# Patient Record
Sex: Male | Born: 1935 | Race: White | Hispanic: No | Smoking: Never smoker
Health system: Southern US, Community
[De-identification: ages and names within clinical notes are randomized; demographics above are authoritative.]

## PROBLEM LIST (undated history)

## (undated) DIAGNOSIS — M549 Dorsalgia, unspecified: Secondary | ICD-10-CM

## (undated) DIAGNOSIS — I10 Essential (primary) hypertension: Secondary | ICD-10-CM

## (undated) DIAGNOSIS — I499 Cardiac arrhythmia, unspecified: Secondary | ICD-10-CM

## (undated) DIAGNOSIS — I639 Cerebral infarction, unspecified: Secondary | ICD-10-CM

## (undated) DIAGNOSIS — M1990 Unspecified osteoarthritis, unspecified site: Secondary | ICD-10-CM

## (undated) DIAGNOSIS — Z8719 Personal history of other diseases of the digestive system: Secondary | ICD-10-CM

## (undated) DIAGNOSIS — I4891 Unspecified atrial fibrillation: Secondary | ICD-10-CM

## (undated) DIAGNOSIS — I482 Chronic atrial fibrillation, unspecified: Secondary | ICD-10-CM

## (undated) DIAGNOSIS — J189 Pneumonia, unspecified organism: Secondary | ICD-10-CM

## (undated) DIAGNOSIS — I63411 Cerebral infarction due to embolism of right middle cerebral artery: Secondary | ICD-10-CM

## (undated) DIAGNOSIS — K469 Unspecified abdominal hernia without obstruction or gangrene: Secondary | ICD-10-CM

## (undated) DIAGNOSIS — S0990XA Unspecified injury of head, initial encounter: Secondary | ICD-10-CM

## (undated) DIAGNOSIS — J45909 Unspecified asthma, uncomplicated: Secondary | ICD-10-CM

## (undated) DIAGNOSIS — S42309A Unspecified fracture of shaft of humerus, unspecified arm, initial encounter for closed fracture: Secondary | ICD-10-CM

## (undated) DIAGNOSIS — N179 Acute kidney failure, unspecified: Secondary | ICD-10-CM

## (undated) HISTORY — PX: FINGER AMPUTATION: SHX636

## (undated) HISTORY — DX: Chronic atrial fibrillation, unspecified: I48.20

---

## 2000-05-11 ENCOUNTER — Emergency Department (HOSPITAL_COMMUNITY): Admission: EM | Admit: 2000-05-11 | Discharge: 2000-05-12 | Payer: Self-pay | Admitting: Emergency Medicine

## 2000-05-11 ENCOUNTER — Encounter: Payer: Self-pay | Admitting: Emergency Medicine

## 2000-05-16 ENCOUNTER — Encounter: Admission: RE | Admit: 2000-05-16 | Discharge: 2000-05-16 | Payer: Self-pay | Admitting: Family Medicine

## 2005-07-08 ENCOUNTER — Observation Stay (HOSPITAL_COMMUNITY): Admission: EM | Admit: 2005-07-08 | Discharge: 2005-07-09 | Payer: Self-pay | Admitting: Emergency Medicine

## 2005-07-08 ENCOUNTER — Ambulatory Visit: Payer: Self-pay | Admitting: Sports Medicine

## 2005-08-03 ENCOUNTER — Encounter (INDEPENDENT_AMBULATORY_CARE_PROVIDER_SITE_OTHER): Payer: Self-pay | Admitting: Specialist

## 2005-08-03 ENCOUNTER — Ambulatory Visit (HOSPITAL_COMMUNITY): Admission: RE | Admit: 2005-08-03 | Discharge: 2005-08-04 | Payer: Self-pay | Admitting: Urology

## 2006-06-04 ENCOUNTER — Emergency Department (HOSPITAL_COMMUNITY): Admission: EM | Admit: 2006-06-04 | Discharge: 2006-06-04 | Payer: Self-pay | Admitting: Emergency Medicine

## 2006-06-14 ENCOUNTER — Inpatient Hospital Stay (HOSPITAL_COMMUNITY): Admission: EM | Admit: 2006-06-14 | Discharge: 2006-06-17 | Payer: Self-pay | Admitting: Emergency Medicine

## 2006-06-15 ENCOUNTER — Ambulatory Visit: Payer: Self-pay | Admitting: Cardiology

## 2006-06-15 ENCOUNTER — Encounter: Payer: Self-pay | Admitting: Cardiology

## 2006-08-07 ENCOUNTER — Encounter: Admission: RE | Admit: 2006-08-07 | Discharge: 2006-08-07 | Payer: Self-pay | Admitting: Neurology

## 2007-06-14 ENCOUNTER — Ambulatory Visit: Payer: Self-pay | Admitting: Infectious Diseases

## 2007-06-14 ENCOUNTER — Inpatient Hospital Stay (HOSPITAL_COMMUNITY): Admission: EM | Admit: 2007-06-14 | Discharge: 2007-06-25 | Payer: Self-pay | Admitting: Emergency Medicine

## 2007-06-15 ENCOUNTER — Encounter: Payer: Self-pay | Admitting: Infectious Diseases

## 2007-06-17 ENCOUNTER — Encounter: Payer: Self-pay | Admitting: Infectious Diseases

## 2008-11-29 IMAGING — CR DG CHEST 1V PORT
1 series · 1 of 1 positions shown · non-contrast
Comparison: 06/14/07.

CLINICAL DATA: Renal failure and pneumonia. 
 PORTABLE CHEST ? 1 VIEW ? 06/15/07 ? 7507 HOURS:

[view not recorded]
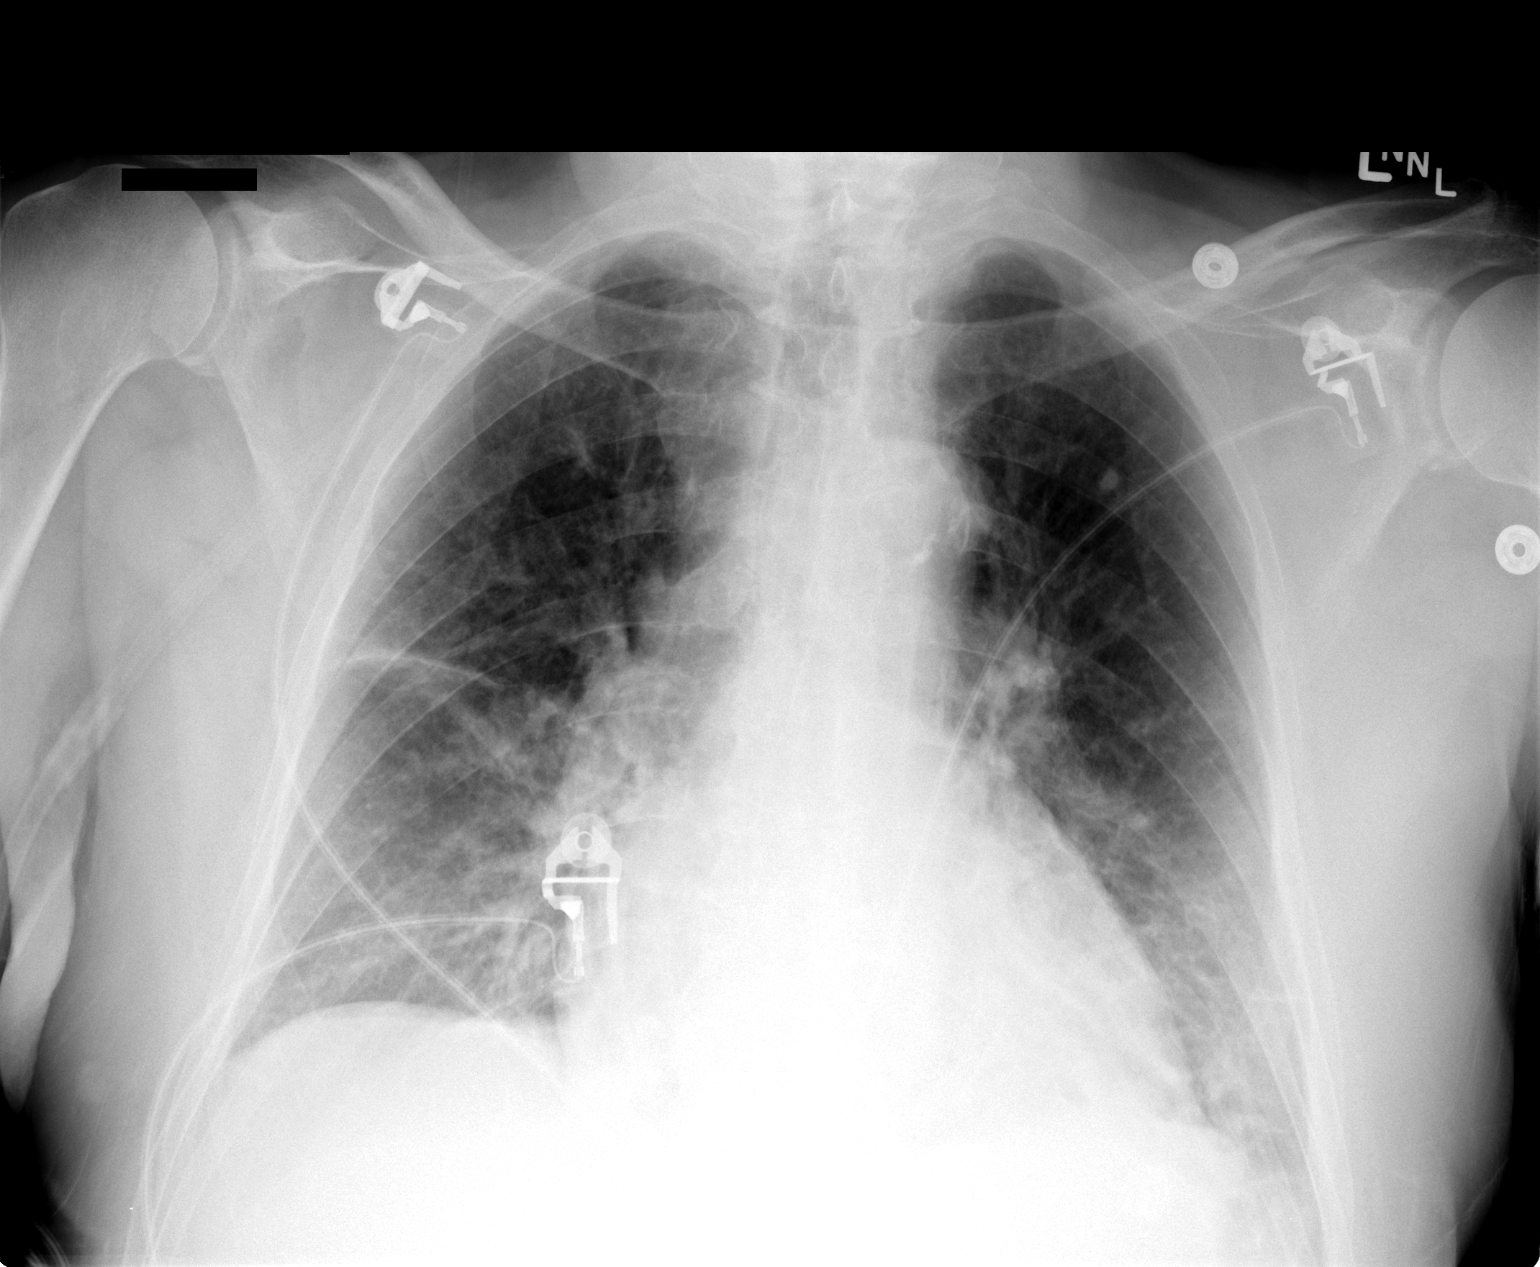

[1 of 1 positions shown; findings below may reference images not displayed]

FINDINGS: In the interval since the prior study, the patient now shows evidence of developing infiltrates in both lower lobes.  No overt edema.  No visible pleural effusions.  Stable cardiomegaly.
IMPRESSION: Bilateral lower lobe pulmonary infiltrates.

## 2008-11-29 IMAGING — CR DG CHEST 1V
1 series · 1 of 1 positions shown · non-contrast
Comparison: 3503 hours.

CLINICAL DATA: Pneumonia, renal failure and shortness of breath. 
 PORTABLE CHEST - 1 VIEW ? 06/15/07 AT 9616 HOURS:

[view not recorded]
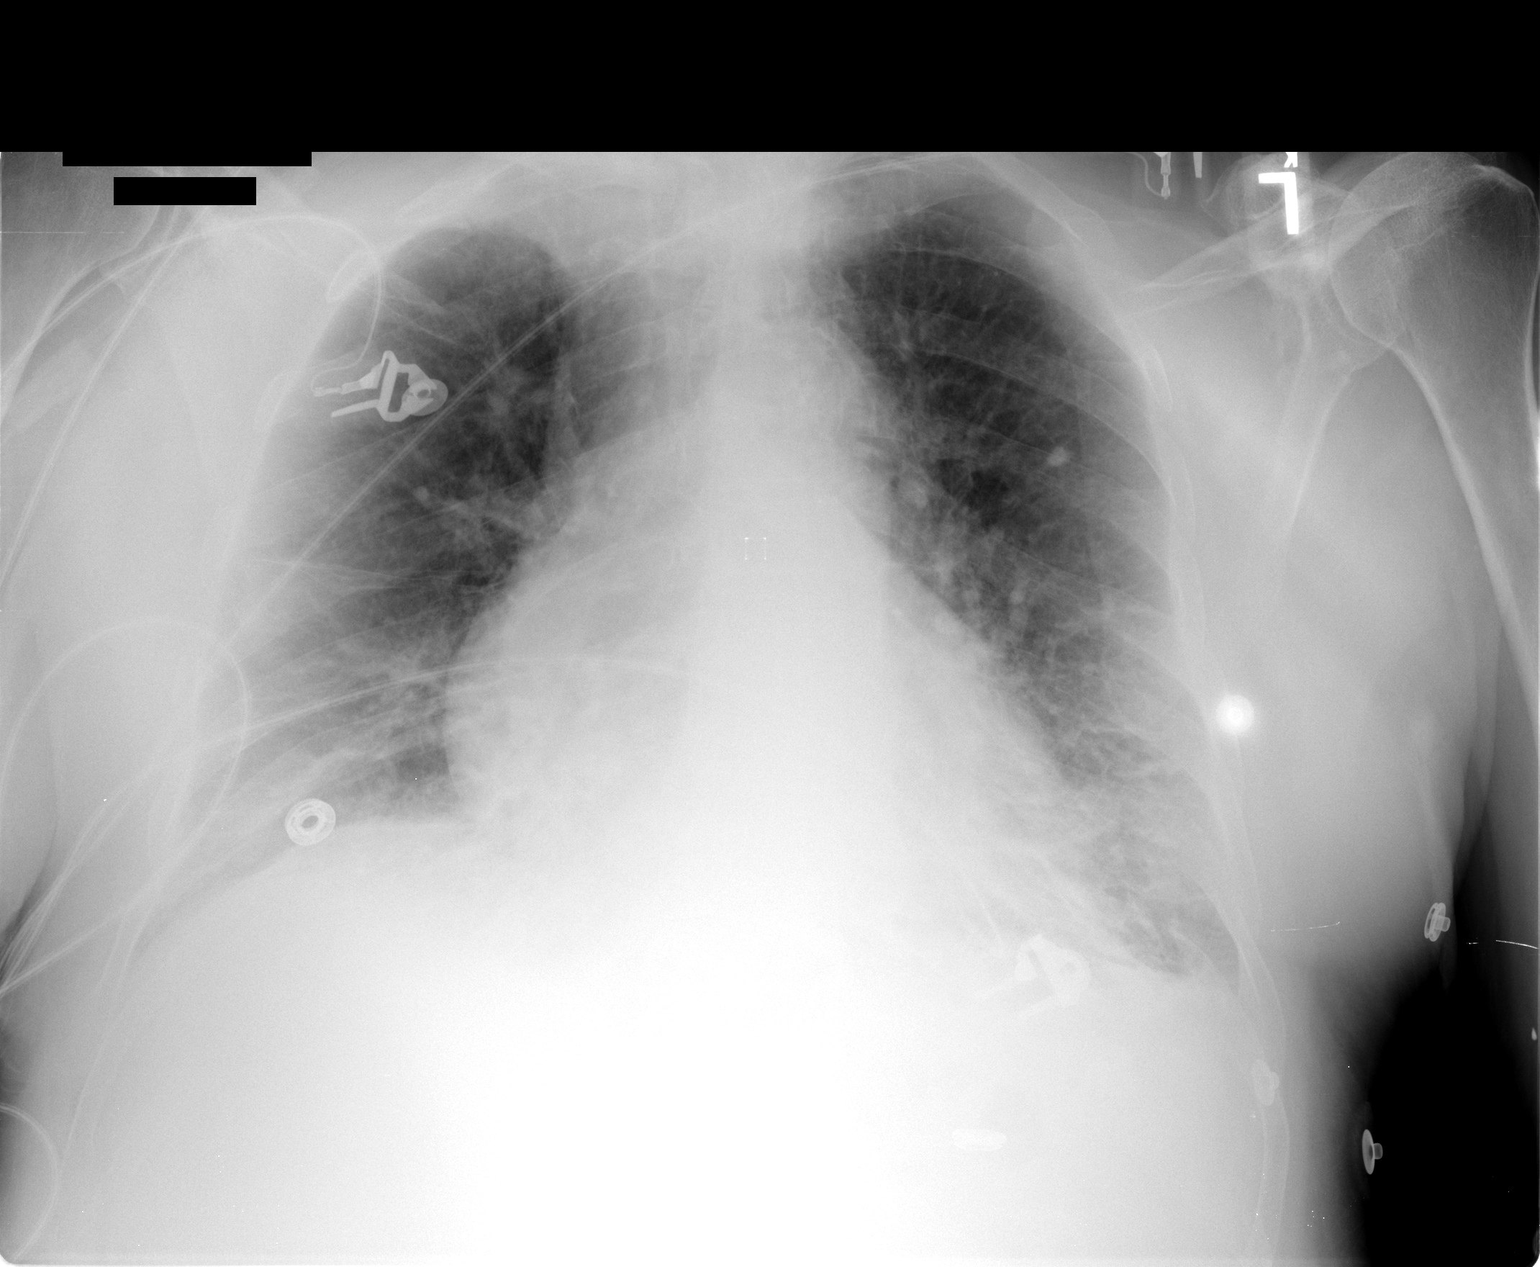

[1 of 1 positions shown; findings below may reference images not displayed]

FINDINGS: No significant change in bilateral lower lobe infiltrates.  No interval edema.  Stable heart size.
IMPRESSION: No significant change in appearance of bilateral lower lung infiltrates.

## 2011-02-10 NOTE — Procedures (Signed)
CLINICAL HISTORY:  77 year old man with a history of right sided tingling  and numbness and left subdural hematoma on the CT scan. EEG is performed for  evaluation of possible left brain focal seizure.  The patient is described  as awake and drowsy.  This is a routine EEG done without photic stimulation  and hyperventilation.   DESCRIPTION:  Predominant rhythm of this tracing is a low to moderate  amplitude alpha rhythm of 10 Hz which predominates posteriorly, appears  without abnormal asymmetry, and attenuates with opening and closing.  Low  amplitude fast activity is seen frontally and centrally and appears without  abnormal asymmetry.  No epileptiform discharges are seen. As the record  progresses, intermittent mild slowing into the 7-8 Hz range is seen over the  left hemisphere as compared to the right.  The patient remained in the awake  state throughout the recording.  Photic stimulation and hyperventilation  were not performed.  Single channel devoted to EKG revealed sinus rhythm  throughout with a rate of approximately 60 beats per minute.   CONCLUSIONS:  Abnormal study due to the presence of mild slowing of  background rhythms over the left hemisphere, findings suggestive of mild  underlying neural dysfunction and focal pathology.  No epileptiform  discharges seen, however.      Michael L. Thad Ranger, M.D.  Electronically Signed     WUX:[id number]  D:  06/14/2006 20:07:12  T:  06/17/2006 12:42:41  Job #:  [id number]

## 2011-02-10 NOTE — Discharge Summary (Signed)
Jerry [name], Jerry [name] NO.:  [id number]   MEDICAL RECORD NO.:  [id number]          PATIENT TYPE:  OBV   LOCATION:  4730                         FACILITY:  [facility]   PHYSICIAN:  Altamese Cabal, M.D.  DATE OF BIRTH:  11-Jan-1924   DATE OF ADMISSION:  07/08/2005  DATE OF DISCHARGE:  07/09/2005                                 DISCHARGE SUMMARY   DISCHARGE DIAGNOSES:  1.  Syncope.  2.  Hypertension.  3.  Hyperlipidemia.  4.  Benign prostatic hypertrophy.   DISCHARGE MEDICATIONS:  1.  Terazosin 10 mg p.o. at night.  2.  Lipitor 40 mg p.o. daily.  3.  Bactrim twice daily x3 days.   BRIEF HISTORY OF PRESENT ILLNESS:  This is a very pleasant 77 year old male  who presented after a syncopal episode witnessed by his son.  Apparently,  the patient was in a sitting position and began to doze off, however, his  son was not able to arouse him from this, and he remained unarousable for a  total of 15 minutes.  Recent changes were an increase in his medications,  notably terazosin.   HOSPITAL COURSE BY PROBLEM:  1.  Syncope:  We thought this was most likely due to synergistic effects of      his medications terazosin and Verapamil, so we held his Verapamil and      decreased his terazosin during his hospitalization.  We also monitored      him on telemetry overnight, and did not find any evidence of an      arrhythmia that may have precipitated this episode.  The patient was      actually asymptomatic when he was admitted.  So we wanted to rule-out a      cardiac and neurogenic source for syncope.  His head CT was within      normal limits.  All of his electrolytes were within normal limits, and      his chest x-ray did not show any acute abnormality.  He had normal      metabolic studies including normal TSH and glucose level.  We discharged      him home on just 10 mg of terazosin and held his Verapamil.  2.  Hypertension:  His blood pressures remained under excellent  control      during his hospitalization, even off of the Verapamil.  He may need      adjustment of his medications by his primary care doctor.  3.  Hyperlipidemia:  We continued his Lipitor while he was in the hospital.  4.  Urinary tract infection:  We continued the patient on Bactrim for his      urinary tract infection that was diagnosed by his primary care      physician.  We told the patient to take a total of seven days of this      antibiotic.   FOLLOWUP INSTRUCTIONS:  The patient is to take all medications as  prescribed.  He is to stop his Verapamil.  He is to follow-up with his  primary care  physician next week.      Altamese Cabal, M.D.     KS/MEDQ  D:  07/10/2005  T:  07/10/2005  Job:  [id number]   cc:   Windle Guard, M.D.  Fax: [phone]

## 2011-02-10 NOTE — H&P (Signed)
[place]. [facility]  Patient:    Jerry [name], Jerry [name]                         MRN: [id number] Adm. Date:  [id number] Attending:  Tobin Chad Dictator:   Solon Palm, M.D.                         History and Physical  ATTENDING PHYSICIAN:  Wayne A. Sheffield Slider, M.D.  RESIDENT PHYSICIAN:  Kinnie Scales. Reed Breech, M.D.  INTERN:  Solon Palm, M.D.  CHIEF COMPLAINT:  Syncope/allergic reaction.  HISTORY OF PRESENT ILLNESS:  Patient is a 77 year old white male who was out mowing the lawn at about 4-5 p.m. when stung by yellow jackets multiple times. Made it inside the bathroom, where he passed out.  Prior to mowing the lawn, he had drank about 40 beers and about two vodkas.  When EMS arrived, unresponsive, started CPR.  When started to intubate, patient aroused.  EMS also gave epinephrine x 1.  Patient apparently with pain at bee stings on left ear and left leg.  Denies shortness of breath, had chest discomfort earlier, none currently.  Complained of facial swelling.  Upon rising from lying position, became hypotensive and nauseated.  PAST MEDICAL HISTORY: 1. Hypertension. 2. Left little finger trauma, autoamputation. 3. Alcohol abuse. 4. History of tobacco abuse, quit 15 years ago.  MEDICATIONS:  Verapamil 250 mg one-and-a-half tablets q.d.  ALLERGIES: 1. PENICILLIN. 2. TETANUS.  FAMILY HISTORY:  Diabetes and hypertension.  SOCIAL HISTORY:  Lives with girlfriend.  Two wives have passed away.  Former smoker, drinks up to 16 drinks in a typical day.  Denies intravenous drug use.   REVIEW OF SYSTEMS:  No shortness of breath, some chest discomfort earlier. Dizziness when stands.  No dysuria, cough, no palpitations, no focal pain or neuro symptoms.  PHYSICAL EXAMINATION:  VITAL SIGNS:  Blood pressure 115/99, heart rate 74, respirations 24, temperature 96.5, saturation 93%.  HEENT:  PERRLA, EOMI, tympanic membranes clear.  Blood in right naris.   Facial edema.  Mucous membranes dry, poor dentition.  NECK:  Supple.  No lymph, no thyroid palpable.  CARDIOVASCULAR:  Regular rate and rhythm, S1 and S2, no S3, S4.  No murmurs, clicks, or gallops.   PMI is laterally displaced.  RESPIRATORY:  Lungs are clear to auscultation, although there is poor respiratory effort.  ABDOMEN:  Soft, nontender, nondistended, with positive bowel sounds.  Liver may be slightly enlarged.  NEUROLOGIC:  Cranial nerves 2-12 are intact.  Sensation is intact.  Also, patient is oriented x 4 and alert, slightly sluggish.  EXTREMITIES:  No clubbing, cyanosis, or edema.  Erythematous oval lesions, back of left leg, on left ear, back of right leg, on chest, and buttocks.  LABORATORY DATA:  Alcohol 75.  CK 141, MB 2.0, index 1.4, troponin I 0.03. ABG is 7.24/34/95/15.  ______ 1.4.  White count is 17.5, hemoglobin 15.5, hematocrit 47, platelets 231.  Sodium 137, potassium 3.7, chloride 106, CO2 14, BUN 9, creatinine 1.4, glucose 183.  ASSESSMENT AND PLAN: 1. Syncope, cardiac versus vasovagal versus asphyxia.  Telemetry, rule out MI.    Believe related to pain of stings and possibly hypotension from allergic    reactions. 2. Allergic reactions.  Fluid p.r.n. for hypotension and if severe, consider    more epinephrine.  Steroids and Benadryl.  Observation - possible delayed    reaction ______ hours  post sting.  Will continue IV Benadryl around the    clock overnight.  Will also need a fluid bolus if patient is    hypotensive. 3. Alcohol abuse.  Watch for signs of withdrawal.  Will check LFTs.  This is a    six-month history for this patient, and will request social work    involvement in rehab programs. 4. Leukocytosis with a left shift.  Will do urinalysis and blood cultures. 5. Metabolic acidosis.  Differential includes lactic acidosis, respiratory    down for some time.  Also possibly secondary to alcohol intake.  Will    follow CMP and a BMET in the  morning.  Will continue fluid hydration    overnight. 6. Elevated white count with left shift.  Will do urinalysis and blood    cultures to look for a source of possible infection. 7. Hypotension.  Will hold verapamil at this time. 8. Hypokalemia.  Patient has received two doses of IV potassium in the ER.    Will recheck and possibly replace p.o. potassium.  Likely secondary to    patients six month alcohol abuse history. DD:  05/11/00 TD:  05/12/00 Job: [id number]

## 2011-02-10 NOTE — Discharge Summary (Signed)
[place]. [facility]  Patient:    Jerry [name], Jerry [name]                         MRN: [id number] Adm. Date:  [id number] Disc. Date: [id number] Attending:  Tobin Chad Dictator:   Doren Custard, M.D. CC:         Dr. Jeannetta Nap   Discharge Summary  DISCHARGE DIAGNOSES: 1. Allergic reaction. 2. Syncope. 3. Alcohol intoxication.  DISCHARGE MEDICATIONS: 1. Verapamil continue as before. 2. Prednisone 40 mg p.o. q.d. x 4 days (to complete a five-day burst). 3. Benadryl 25 mg p.o. q.6h. x 2 days, then q.6h. p.r.n. 4. EpiPen inject IM p.r.n. bee, hornet, or wasp sting.  CONSULTATIONS:  None.  PROCEDURES:  None.  BRIEF HISTORY OF PRESENT ILLNESS:  This is a 77 year old man who was mowing his lawn when he went over a nest of yellow jackets and was stung multiple times. He made it inside to his bathroom when he passed out. He was found to be unresponsive. EMS was called and started CPR and attempted to intubate. The patient aroused and was subsequently brought to the emergency room.  HOSPITAL COURSE: #1 - ALLERGIC REACTION:  Felt to be probable anaphylaxis. The patient responded to epinephrine in the field, as well as Solu-Medrol, Benadryl, and Pepcid, and fluid and rapidly improved. By day of discharge, he is asymptomatic and without complaint. Of note, the patient was hypotensive for several hours after the stings, but he had no wheezing.  #2 - HYPERTENSION, CHRONIC PROBLEM:  We will continue Verapamil as an outpatient.  #3 - ALCOHOL ABUSE:  The patient had a blood alcohol level of 75 and has reportedly been drinking 12 to 16 drinks daily. Encouraged to abstain from alcohol.  CONDITION ON DISCHARGE:  Stable and improved. Vital signs were stable. Blood pressure had been running in the 140 to 150/60 to 70 range. Heart rate was stable in the 60s to 70s, and respiratory rate was 16 and 18. He was saturating at 100% on room air, and had been afebrile during his  admission.  FOLLOW-UP:  The patient is to follow up with Dr. Jeannetta Nap who will be his doctor of choice.  LABORATORY DATA:  White blood cell count of 15.6, hemoglobin of 14.6, platelets of 175. Sodium 132, potassium 4.8, chloride 105, bicarb of 24, glucose of 237, BUN of 16, creatinine 1.3, calcium 7.5. Cardiac enzymes were negative x 2. DD:  05/12/00 TD:  05/14/00 Job: [id number]

## 2011-02-10 NOTE — Discharge Summary (Signed)
NAMEDEANTE, [name] NO.:  [id number]   MEDICAL RECORD NO.:  [id number]          PATIENT TYPE:  OIB   LOCATION:  1411                         FACILITY:  [facility]   PHYSICIAN:  Claudette Laws, M.D.  DATE OF BIRTH:  1924/01/08   DATE OF ADMISSION:  08/03/2005  DATE OF DISCHARGE:  08/04/2005                                 DISCHARGE SUMMARY   HISTORY OF PRESENT ILLNESS:  This is a 77 year old man who recently  presented to our office with obstructive symptoms, some nocturia and also  episode of gross hematuria.  A CT scan was negative, however, cystoscopy  revealed a nodular lesion right in the midline at the trigone inside the  bladder neck area.  This was hard to diagnose cytoscopically in the office.  He also had the symptoms of outlet obstruction and we discussed the  possibility of a transurethral incision of his bladder neck as he also had a  bladder neck obstruction.  The rest of his history is unremarkable.  He is  basically in good health.  It was noted that he was admitted on July 08, 2005, with an episode of syncope, hypertension and also BPH, but he was sent  on Terazosin 10 mg at night.   LABORATORY DATA AND X-RAY FINDINGS:  White count 6400, hemoglobin 14.0,  hematocrit 39.2.  Electrolytes were normal with a BUN of 10, creatinine 1.0.   Chest x-ray showed stable, mild cardiomegaly and some COPD.   HOSPITAL COURSE:  The patient came in as an outpatient on August 03, 2005.  He underwent a transurethral resection of this nodular lesion of the trigone  and then we went ahead and resected out his bladder neck and did a channel  type TUR of prostate, also a transurethral incision at the bladder neck.  Postop, I left in a three-way Foley catheter.  He was observed overnight and  by the next morning he was feeling well and comfortable.  The urine was  clear so he was sent home with a catheter in place and will come back to the  office in 3 days for  catheter removal and a trial of voiding.  At that time,  we should have the pathology back.   DISCHARGE DIAGNOSES:  1.  Benign prostatic hypertrophy with symptoms of bladder outlet      obstruction.  2.  Nodular lesion, rule out bladder carcinoma.  3.  History of hypertension.  4.  Recent episode of syncope.   PROCEDURES:  1.  Cystoscopy.  2.  Transurethral resection of bladder lesion.  3.  Channel type transurethral resection of prostate.   COMPLICATIONS:  None.   CONDITION ON DISCHARGE:  Recovering.   DISCHARGE MEDICATIONS:  1.  Cipro 250 mg one b.i.d., #10.  2.  Tylox one every 4 hours for pain, #25.  3.  He will stop his aspirin for now.  4.  Renew hypertensive medications.   DIET:  Regular diet, force fluids.   ACTIVITY:  Limited activity.   FOLLOW UP:  Return to the office in 3 days  for catheter removal.      Claudette Laws, M.D.  Electronically Signed     RFS/MEDQ  D:  08/04/2005  T:  08/04/2005  Job:  [id number]

## 2011-02-10 NOTE — H&P (Signed)
NAMEZAINE, ELSASS NO.:  [id number]   MEDICAL RECORD NO.:  [id number]          PATIENT TYPE:  OBV   LOCATION:  1827                         FACILITY:  [facility]   PHYSICIAN:  Santiago Bumpers. Hensel, M.D.DATE OF BIRTH:  May 28, 1924   DATE OF ADMISSION:  07/08/2005  DATE OF DISCHARGE:                                HISTORY & PHYSICAL   CHIEF COMPLAINT:  Syncope.   HISTORY OF PRESENT ILLNESS:  Mr. Hammerschmidt is a 77 year old male with  hypertension, hypercholesterolemia, and BPH who presents to the ED after a  questionable syncopal event this morning.  The patient states he was riding  in his son's truck and began to feel very tired, weak, and bad and his  vision began darkening.  The son states that he appeared to have fallen  asleep but when they arrived home five minutes later his son was not able to  wake him up.  After about ten minutes, the patient began coming to.  No  confusion was noted after the event, no abnormal movement during this  episode, though he did have an episode of incontinence during this.  Of  note, the patient was seen by his primary care physician last week and  diagnosed with a urinary tract infection (the patient was having bloody  urine) and he was prescribed Bactrim.  At that time, he was also instructed  to double his dose of terazosin from 10 to 20 mg daily which he takes for  urinary frequency.   REVIEW OF SYSTEMS:  Negative for chest pain, shortness of breath,  palpitations, nausea, vomiting, fever, chills, and abdominal pain.   PAST MEDICAL HISTORY:  1.  Hypertension.  2.  Hyperlipidemia.  3.  BPH.   MEDICATIONS:  1.  Lipitor 40 mg daily.  2.  Verapamil 240 mg p.o. daily.  3.  Bactrim one tab every 12 hours.  4.  Terazosin 20 mg p.o. daily.   ALLERGIES:  1.  PENICILLIN causes rash.  2.  TETANUS.   SOCIAL HISTORY:  The patient lives with his son and his daughter-in-law in  [place].  He has been living with them for the past four  to five months  since the death of his third wife.  He quit smoking 20 years ago.  He drinks  about a six-pack of beer a day.  He has not drunk at all in the past week.   FAMILY HISTORY:  He has a brother who is living at age 41 who has coronary  artery disease and type 2 diabetes.  He has a sister also with diabetes.  His father died at age 42 of pancreatic cancer.  His mother died in her 30s  of pneumonia.   PHYSICAL EXAMINATION:  VITAL SIGNS:  Temperature 97.4, blood pressure 130 to  149 over 52 to 70, pulse 58 to 64, respirations 21 to 24, oxygen 89% on room  air, up to 99% on 2 liters nasal cannula.  GENERAL:  This patient is a well-appearing, well nourished, elderly male who  is alert and oriented x3 and in  no acute distress.  HEENT:  Head is normocephalic and atraumatic.  Pupils equal, round, reactive  to light and accommodation.  Extraocular movements intact.  Moist mucous  membranes.  Oropharynx is clear.  NECK:  Supple.  No thyromegaly.  No lymphadenopathy.  No JVD or carotid  bruits.  CARDIOVASCULAR:  Heart is irregular with occasional premature beats.  No  murmurs, rubs, or gallops.  LUNGS:  The patient is breathing comfortably.  He has slightly decreased air  movement throughout.  No wheezes and no crackles evident.  ABDOMEN:  Soft.  Normoactive bowel sounds, nontender, nondistended.  No  hepatosplenomegaly.  EXTREMITIES:  No clubbing, cyanosis, or edema.  Distal pulses are 2+ and  equal.  MUSCULOSKELETAL:  No joint effusions or erythema.  Full range of motion in  all extremities.  NEURO:  Cranial nerves II-XII are grossly intact.  His strength is 5/5  throughout.  Normal sensation throughout.  Reflexes 2+ and symmetric.  Gait  is not assessed.   LABORATORY/STUDIES:  White blood count 7.8, hemoglobin 13.2, hematocrit  37.5, platelets 199, MCV is 91.  INR 1.0.  Sodium 138, potassium 3.7,  chloride 108, bicarb 24, BUN 12, creatinine 1.2, glucose 147.  Albumin 3.8,   calcium is 8.6, alkaline phosphatase 46, total bilirubin 0.6, AST 25, ALT  24.  Urinalysis negative leukocyte esterase, negative nitrites, negative  ketones, moderate hemoglobin; urine micro 0-2 white blood cells and red  blood cells too numerous to count.  Point-of-care enzymes negative x1.  Chest x-ray had stable mild cardiomegaly, calcified granuloma in the left  upper lobe but no acute abnormalities.  EKG showed PACs otherwise normal  intervals, no ventricular hypertrophy and no ST-T changes.  CT of the head  was within normal limits.   ASSESSMENT:  This is a 77 year old male with a syncopal event.   PLAN:  1.  Syncope.  The differential for this problem is very broad on the basis      of his story.  It seems to be most likely a side effect from his      medication changes and possibly orthostasis.  We do need to rule out an      arrhythmia, though his EKG is reassuring.  We will have him on      telemetry.  We will rule out an myocardial infarction with cardiac      enzymes and a repeat EKG in the morning.  We will also check his      orthostatics.  2.  Hypertension.  His blood pressure is currently stable.  We will hold his      diltiazem since it has synergistic action with terazosin.  He may need      an alternate medication regimen developed by his primary care physician.  3.  Urinary tract infection/hematuria.  The patient is currently being      treated for a urinary tract infection with Bactrim and we will continue      this antibiotic.  Nephrolithiasis is a possibility but doubtful since he      does not have any abdominal or back pain.  4.  Benign prostatic hypertrophy.  We will continue his terazosin, but we      will divide it twice a day so that he may tolerate this better.  5.  Premature atrial contractions.  These are likely a benign finding.  We      will monitor him on telemetry, and we  will check a TSH. 6.  Fluids, electrolytes, nutrition.  We will gently  hydrate him with 1/2      normal saline with 20 KCl at 100 cc/hr.  He will have a regular diet and      recheck his electrolytes in the morning.      Altamese Cabal, M.D.    ______________________________  Santiago Bumpers. Leveda Anna, M.D.    KS/MEDQ  D:  07/08/2005  T:  07/08/2005  Job:  [id number]

## 2011-02-10 NOTE — Op Note (Signed)
Jerry [name], CRIHFIELD NO.:  [id number]   MEDICAL RECORD NO.:  [id number]          PATIENT TYPE:  AMB   LOCATION:  DAY                          FACILITY:  [facility]   PHYSICIAN:  Claudette Laws, M.D.  DATE OF BIRTH:  11-06-1923   DATE OF PROCEDURE:  08/03/2005  DATE OF DISCHARGE:                                 OPERATIVE REPORT   PREOPERATIVE DIAGNOSES:  1.  Benign prostatic hypertrophy with episode of hematuria and bladder      outlet symptoms.  2.  Apparent benign prostatic hypertrophy nodule on the trigone just inside      the bladder neck area.   POSTOPERATIVE DIAGNOSES:  1.  Benign prostatic hypertrophy with episode of hematuria and bladder      outlet symptoms.  2.  Apparent benign prostatic hypertrophy nodule on the trigone just inside      the bladder neck area.   OPERATION:  Cystoscopy and transurethral resection, median lobe and TUR  bladder neck, also transurethral incision of the bladder neck.   SURGEON:  Dr. Etta Grandchild   PROCEDURE:  The patient was prepped and draped in the dorsolithotomy  position under spinal anesthesia.  Cystoscopy was performed with a 22-French  cystoscope.  He had a normal anterior urethra.  He had some anterior  notching of his prostate, slight elongation of the prostatic urethra but  what was unusual was this peculiar, about a 2.5 cm nodule occupying the  trigone right in the midline.  This had the appearance more of a BPH nodule  rather than a bladder tumor.  This was well away from the ureteral orifices.  The bladder itself was trabeculated +1 but no tumors, no calculi.   After dilating the urethra with Sissy Hoff sounds, a #28-French resectoscope  sheath was placed into the bladder with continuous flow and using the  [facility] working element and the camera, I resected out this nodule flush  with the trigone.  I fulgurated the base.  We then performed the a TUI  incision of the bladder neck at the 5 and 7 o'clock position with  the  [employer].  I then went back and then using the cutting loop, I resected  out the bladder neck at the 6 o'clock position back almost to the veru.  I  fulgurated the base.  We then put in a 22-French 20 mL Foley catheter,  hooked it to a straight drain.  The irrigant was clear.  A B&O suppository  was placed, and the chips were sent for pathologic examination.  Blood loss  was minimal.      Claudette Laws, M.D.  Electronically Signed     RFS/MEDQ  D:  08/03/2005  T:  08/03/2005  Job:  [id number]

## 2011-02-10 NOTE — Discharge Summary (Signed)
NAMEDANNI, LEABO NO.:  [id number]   MEDICAL RECORD NO.:  [id number]          PATIENT TYPE:  INP   LOCATION:  4734                         FACILITY:  [facility]   PHYSICIAN:  Edsel Petrin, D.O.DATE OF BIRTH:  09/17/1924   DATE OF ADMISSION:  06/14/2007  DATE OF DISCHARGE:  06/25/2007                               DISCHARGE SUMMARY   DISCHARGE DIAGNOSES:  1. Acute renal failure secondary to a severe pyelonephritis.  2. Gram-negative bacteremia.  3. Pneumonia.  4. History of alcohol abuse.  5. History of chronic subdural hematoma.  6. History of benign prostatic hypertrophy.  7. Hyperlipidemia.  8. Hypertension.  9. New-onset atrial fibrillation, most likely secondary to his acute      illness, isolated episode, resolved.  10.Cholelithiasis with multiple gallstones, no acute gallbladder      disease at this admission.  11.Positive ANA with speckled pattern, low titers suggesting chronic      inflammation.  12.Metabolic acidosis with renal tubulopathy secondary to primary      process in #1.  13.Urinary light chain disease, severe proteinuria.  14.History of transurethral resection of the prostate in November      2006.  15.History of third digit amputation.   DISCHARGE MEDICATIONS:  1. Amlodipine 5 mg p.o. daily.  2. Aspirin 81 mg p.o. daily.  3. Keflex 500 mg b.i.d. for 6 days.  4. Folic acid 1 mg daily.  5. Fosrenol 750 mg p.o. b.i.d.  6. Ativan 0.5 mg 1 tablet p.o. nightly.  7. Metoprolol 50 mg p.o. b.i.d.  8. Nystatin 5 mg p.o. t.i.d. swish and swallow p.r.n.  9. Protonix 40 mg p.o. daily.  10.Bicitra 30 mL t.i.d.  11.Thiamine 100 mg p.o. daily.   DISPOSITION AND FOLLOW-UP:  Mr. Methot was discharged from the hospital in  stable and improved condition.  He has an appointment for follow up with  Dr. Jeannetta Nap, his primary care physician, on July 02, 2007 at 10:00  a.m.  At the time of follow-up, Mr. Ohms will need to have his urine  checked  to ensure resolution of his pyelonephritis.  He will also need a  BMET or a renal panel in order to check his electrolytes including his  bicarb, his potassium, his magnesium and phosphorus.  He is being  discharged on phosphorus and bicarb replacement.  Therefore, these will  need to be adjusted accordingly based on his laboratories.  He will also  need his blood pressure medications adjusted and probably increased as  he recovers.  Mr. Ergle will benefit from regular primary care in order  to reduce the reoccurrence of prolonged seeking of medical attention  when acute problems arise.  Of note, for the outpatient setting, Mr.  Landin had an episode of atrial fibrillation while in the hospital.  This  was likely just from an acute situation.  He has now been placed on a  beta blocker.  This might need further workup in the outpatient setting.  Also of note, due to Mr. Koudelka's significant proteinuria during his  hospitalization.  A urine protein electrophoresis  was checked and showed  significant light chains in his urine.  He will need to be rechecked for  light chain disease or multiple myeloma once this acute renal failure  and pyelonephritis resolves in its entirety.  Also at discharge, he will  need to complete a course of antibiotics and have a repeat chest x-ray  in 2-3 months given bilateral infiltrates seen during his  hospitalization.   CONSULTATIONS:  None.   PROCEDURES PERFORMED:  1. Chest x-ray on June 14, 2007, COPD, but no acute pulmonary      findings, question bilateral infiltrates with some borderline      cardiomegaly.  2. CT of pelvis without contrast on June 14, 2007, cortical      thickening of both kidneys noted.  Extensive interstitial change in      the perinephric fat indicating an inflammatory process and severe      pyelonephritis.  Splenic calcifications.  No masses or adenopathy.      Extensive atherosclerotic changes.  No aortic focal aneurysms.       Note inflammatory perinephric findings extended all the way down      into the ureters near the bladder and in the pelvis.  3. Transthoracic echocardiogram on June 17, 2007, left      ventricular systolic function was normal, ejection fraction 60%,      mildly thickened left ventricular wall.  Aortic valve thickness      mildly increased.  Mild mitral valvular regurgitation.  Mildly      dilated left atrium.   BRIEF ADMISSION HISTORY AND PHYSICAL:  Vital signs on admission  temperature 100.6, blood pressure 113/65, pulse 95, respiratory rate 18,  O2 sats 93% on room air.  Orthostatic vital signs:  Lying down 139/69,  pulse 80, sitting up 141/75, pulse 80.  He appeared acutely ill at  presentation.  He was lethargic, dry mucous membranes.  He had right  lower lobe crackles in his lungs.  His heart was regular rate and rhythm  he had good pulses bilaterally in his lower extremities.  He had  costovertebral angle tenderness.  He had a Foley in place when examined  with significant postvoid residual.  There was no ascites.  He had  active bowel sounds.  Tenderness to palpation of his pelvis.  No focal  neurological findings.  No adenopathy.  He was alert and oriented x4.   LABS ON ADMISSION:  Sodium 126, potassium 3.4, chloride 89, bicarb 22,  BUN 61, creatinine 6.26, glucose 138.  GFR was 9.  WBCs 19.5, hemoglobin  16, platelets 157, ANC 18.1, RDW 14.2, MCV 90.9.  Anion gap 15.  Bilirubin 1.4, alk phos 74, SGOT 29, SGPT 21, protein 6.4, albumin 2.6,  calcium 8.4.  He was FOBT negative.  PT 14.3, INR 1.1, PTT 37.  UA:  Cloudy, large blood, greater than 100 protein, urobilinogen 1.0, large  leukocytes, wbc too numerous to count, rbc 7-10, many bacteria.   Chest x-ray:  Small bilateral effusions versus bibasilar atelectasis.   Magnesium 2, lipase 30, adjusted calcium 9.5.   HOSPITAL COURSE BY PROBLEM:  PROBLEM #1 -  ACUTE RENAL FAILURE WITH  PYELONEPHRITIS:  Upon admission, Mr.  Weekly's symptoms, urinalysis and  blood work, as well as imaging, all indicated that he had a very  advanced pyelonephritis.  He was in a volume depleted state with acute  renal failure.  Imaging did not indicate that he had any hydronephrosis  or that he was acutely  obstructed.  Initially, we aggressively volume  resuscitated him given his normal ejection fraction.  He responded very  slowly to fluids in terms of his urine output and renal function.  It  was then decided to give him a trial dose of Lasix in order to increase  his urine output.  He responded very well to the Lasix with both  diuresis as well and an improvement in his renal function.  He did have  a secondary tubulopathy that developed with a significant decrease in  his serum bicarb with confirmed urinary wasting.  His bicarb was  replaced with Bicitra any acute setting.  In terms of the infectious  process of his pyelonephritis, urine cultures indicated pan sensitive E-  coli infection.  He was started initially on broad-spectrum antibiotics  including vancomycin and Zosyn.  These were changed over to IV Rocephin  once culture data became available.  He continued to improve in terms of  his renal function and sepsis.  He was transitioned over to p.o. Keflex  for a complete 14-day therapy dose.  His Foley catheter was removed and  he had spontaneous normal urine output prior to discharge.  He was  afebrile throughout his hospitalization.  He maintained his blood  pressure at a normal range.   PROBLEM #2 -  GRAM-NEGATIVE BACTEREMIA:  Mr. Sneed's blood cultures  showed that he had a concurrent gram-negative bacteremia from his  urosepsis.  He did not, at any time, display shock from this.  He was  started on IV antibiotics and completed at least 10 days of IV  antibiotics in addition to being discharged home on p.o. Keflex for the  E-coli bacteremia and pyelonephritis.  Repeat blood cultures obtained  showed no additional  bacterial growth.  This will be followed in the  outpatient setting for any late complications of bacteremia.   PROBLEM #3 -  ATRIAL FIBRILLATION:  While being followed in the step-  down unit, Mr. Streater was noted to have had a new onset atrial  fibrillation on 12-lead EKG.  This was likely secondary to his acute  renal failure and metabolic derangement.  There was only one episode of  atrial fibrillation.  We did place him on a beta blocker.  He responded  well.  He converted very quickly to normal sinus rhythm and had no  additional episodes of atrial fibrillation while being monitored.   PROBLEM #4 -  RENAL TUBULOPATHY, PROTEINURIA:  Mr. Livers's acute renal  failure was determined to be prerenal at the time he was admitted,  however, he was very slow to respond to IV fluid hydration.  His renal  failure transformed into what appeared to be a tubulopathy, likely ATN.  He had a low bicarb which was repleted with Bicitra.  SPEP was performed  and did not show any M spikes.  However, his urine protein  electrophoresis did show many light chains in his urine.  In addition,  the studies could not rule out a spike in his serum protein  electrophoresis, could not completely exclude an abnormal restrictive  band in a Gamma region.  This will need to be repeated.   PROBLEM #5 -  BILATERAL PULMONARY INFILTRATES:  Mr. Bovenzi was managed for  stable COPD and his antibiotic therapies were focused mostly on  treatment of his pyelonephritis.  After reviewing his chest x-ray, it is  likely that the initial findings were secondary to an inflammatory  process from his bacteremia and urosepsis and  atelectasis and did not  represent a bilateral pneumonia.  He was on broad coverage for this on  initial antibiotics.  He had no respiratory problems or exacerbations of  his COPD.  We recommended for him to resume his home medications for  this.   PROBLEM #6 -  HYPERTENSION:  His blood pressures ran borderline  high  during his hospitalization.  He did benefit from diuresis after the  acute phase of his illness had resolved.  He will need his blood  pressure medications adjusted in the outpatient setting once he has  improved in terms of his strength and complete resolution of his  deconditioning and infection.   DISCHARGE LABORATORY DATA:  Sodium 138, potassium 3.4, chloride 109,  bicarb 24, BUN 57, creatinine 3.9, glucose 90.   VITAL SIGNS AT DISCHARGE:  Temperature 98.7, blood pressure 164/81,  pulse 78, respiratory rate 20, O2 sats 97% on room air.      Edsel Petrin, D.O.  Electronically Signed    ELG/MEDQ  D:  07/08/2007  T:  07/09/2007  Job:  [id number]

## 2011-02-10 NOTE — H&P (Signed)
Jerry [name], Jerry [name] NO.:  [id number]   MEDICAL RECORD NO.:  [id number]          PATIENT TYPE:  INP   LOCATION:  3019                         FACILITY:  [facility]   PHYSICIAN:  Casimiro Needle L. Reynolds, M.D.DATE OF BIRTH:  [birth year]   DATE OF ADMISSION:  06/14/2006  DATE OF DISCHARGE:                                HISTORY & PHYSICAL   CHIEF COMPLAINT:  Code stroke with right-sided weakness.   HISTORY OF PRESENT ILLNESS:  This is the initial [facility] stroke service  admission for this 77 year old man with a past medical history which  includes hypertension and hyperlipidemia.  The patient was at home alone  this morning.  He says that he recalls feeling numbness on the right side  of his body, with onset about 11:00 a.m.  He was not aware of any unusual  sensations prior to this.  He drove himself to a friend's house, and the  friend noted that his right arm was clumsy and that his speech seemed not to  be exactly right.  He was brought to [facility] emergency department where a  code stroke was called.  The patient does not have any history of any  previous symptoms.  He also complains of a slight right-sided headache.  There is no definite history of loss of consciousness, convulsion, nausea or  vomiting, or general visual changes.  The patient fell a few days ago;  however, the details of this are unclear.   PAST MEDICAL HISTORY:  Remarkable for hypertension and hyperlipidemia.  He  has a history of benign prostatic hypertrophy status post surgery.  He is  admitted for syncope in October of last year, and this was thought due to  medications.  He was seen in the emergency department 10 days ago for  dehydration and diarrhea and had a CT of the head at that time.  His primary  doctor is Dr. Windle Guard.   FAMILY HISTORY:  Remarkable for diabetes and coronary artery disease.   SOCIAL HISTORY:  He lives with his son who is at work today.  He normally  drives and  is fairly independent in his activities of daily living.  He  reports a history of remote tobacco use, rarely consumes alcohol.   ALLERGIES:  PENICILLIN AND TETANUS.   MEDICATIONS:  Verapamil and Flomax, unknown doses.   He denies any history of being on a blood thinner.   REVIEW OF SYSTEMS:  Remarkable for nocturia.  A full 10-system review of  systems is otherwise negative except as outlined in the HPI and in the  initial nursing record.   PHYSICAL EXAMINATION:  VITAL SIGNS:  Temperature 97.6, blood pressure  166/84, pulse 68, respirations 14.  GENERAL:  This is a healthy-appearing man supine in the hospital bed, no  evident distress.  HEAD:  Cranium is normocephalic and atraumatic, oropharynx benign.  NECK:  Supple without carotid bruits.  CHEST:  Clear to auscultation bilaterally.  HEART:  Regular rate and rhythm without murmurs.  ABDOMEN:  Soft with normoactive bowel sounds.  EXTREMITIES:  2+ pulses,  no edema.  He has ecchymosis and obvious bruising  of the right shoulder, but range of motion is full, and there is no  tenderness.  NEUROLOGIC:  Mental status:  He is awake and alert.  He is fully oriented to  time and place.  His speech is rather hesitant.  He is able to name objects  and has difficulty repeating phrases.  He can follow most one or two-step  commands.  Mood is euthymic and affect appropriate.  Cranial nerves:  Pupils  are equal and reactive.  Extraocular movements full without nystagmus.  Examination of visual fields reveals a right lower quadrant ataxia.  He has  a slight right facial droop.  The tongue and palate move normally and  symmetrically.  Motor:  Normal bulk and tone.  He has slight drift with  weakness of the right upper and lower extremities and seems to have a little  bit of a motor apraxia.  Left is normal.  Sensation:  Decrease to light  touch in the right upper and lower extremities compared to the left.  Cerebellar:  Rapid movements are  performed slowly on the right.  He is able  to perform finger to nose with the right, but it is a little bit clumsy and  apraxic.  Left is normal.  Reflexes 2+ and symmetric.  Toes are downgoing  bilaterally.  Gait is deferred.   LABORATORY REVIEW:  Labs are pending at this time.  I did personally review  the CT of the head.  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  He also has  bilateral subdural hygromas; however, the new findings are small bilateral  acute subdural hematomas, left greater than right, which do not demonstrate  any significant mass effect.   IMPRESSION:  Acute left brain syndrome, question stroke versus cortical  irritability secondary to subdural.  Risk factors for stroke include  hypertension, hyperlipidemia.  He did have a recent fall a few days ago.   PLAN:  Will admit to the ICU and watch for serial neuro checks.  Will check  an MRI of the brain to include underlying stroke and an EEG to evaluate for  cortical irritability.  Will also check followup CT in the morning to  evaluate interval evolution of the subdural.  It may become necessary to  have the neurosurgeon see him depending on what happens with the subdural.  For now, we will treat empirically with Dilantin for cortical irritability.  Stroke service to follow.      Michael L. Thad Ranger, M.D.  Electronically Signed     MLR/MEDQ  D:  06/14/2006  T:  06/15/2006  Job:  [id number]   cc:   Windle Guard, M.D.

## 2011-02-10 NOTE — Discharge Summary (Signed)
[name], [name] NO.:  [id number]   MEDICAL RECORD NO.:  [id number]          PATIENT TYPE:  INP   LOCATION:  3019                         FACILITY:  [facility]   PHYSICIAN:  Jerry [name], M.D.DATE OF BIRTH:  Nov 23, 1923   DATE OF ADMISSION:  06/14/2006  DATE OF DISCHARGE:  06/17/2006                                 DISCHARGE SUMMARY   FINAL DIAGNOSES:  1. Acute and chronic bilateral subdural hematomas.  2. Right body numbness, 780.20  3. Headache, 74.0  4. Atypical chest pain.  5. Diarrhea with abdominal pain.  6. Hiatal hernia.  7. 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   PROCEDURES:  MRI brain, MRA intracranial, CT brain, CT chest, angio.   COMPLICATIONS:  None.   SUMMARY OF HOSPITALIZATION:  Jerry [name] is a 77 year old gentleman who lives  with his son.  The patient normally drives and is fairly independent in his  activities of daily living.  He has a history of remote tobacco use and  rarely consumes alcohol.   The patient's risk factors for stroke include hypertension and dyslipidemia.   The patient felt numbness to the right side of his body, had evidence of  clumsiness of his arm and left slurred speech and was brought to Jerry [name].  Jerry [name] for evaluation of possible stroke.   He was assessed by Jerry [name], who found evidence of slight drift,  mild weakness of the right side and slurred speech.  NIH stroke scale score  was 6.   The patient's CT scan of the brain showed evidence of diffuse subcortical  white matter disease and also with subdural hematoma, acute on chronic.   MRI scan of the brain confirmed these findings.  There was also evidence of  diffuse subcortical white matter disease.  No evidence of acute strokes.   The patient had a second CT scan of the brain which was compared with  September20,2007, and showed no significant change or progression.  There  was evidence of prior remote infarction in the right  basal ganglia seen on  all studies.   The patient continued to complain of brief episodes for very sharp pain in  the left parasternal region of his chest.  These were sharp and were very  well located and took his breath away.  They happen up to eight times per  day.   As a result of this, a CT scan angiography of the chest was performed and  showed no evidence for acute pulmonary embolus.  There was evidence of  hiatal hernia, borderline cardiomegaly, a pseudocyst within the spleen,  small nodular lesions on the pleural wall indicative of chronic  inflammation.  I suspect the it is the latter that may be responsible for  his pain, although I cannot be certain.  There is no evidence for tumor, for  effusion, for abnormalities in the mediastinal region.  There was some mild  calcifications of the aorta.   The patient has had two EKGs both of which showed a sinus rhythm, inferior  wall infarction of age undetermined, no  progression between the episodes.  The patient also had a series of cardiac enzymes which failed to show  evidence of cardiac ischemia.   Other laboratory studies included prothrombin 12.9. INR 1, PTT 34.  Sodium  134, potassium 3.9, chloride 103, BUN 7, glucose 92.  Venous pH 7.39.  Creatinine 1.2.  Comprehensive metabolic panel showed calcium 8.5, total  protein 6.5, albumin low at 3.3, AST 18, ALT 18, alkaline phosphatase 60,  total bilirubin 1.   White blood cell count 9400, hemoglobin 13.4, hematocrit 38.6, MCV 91.9,  platelet count 242,000.  There were 64% neutrophils, 11% monos, 1%  eosinophils 1% basophils.   Hemoglobin A1c 5.8 for reasons that are unclear to me.  The patient had type  and screen he is A+.   Lipid profile showed total cholesterol 171, triglycerides 88, HDL  cholesterol 47, VLDL cholesterol 18, LDL cholesterol slightly elevated at  106.  I am not going to provide additional treatment for this patient for  this because of that finding.    The patient's hospital course was one of stability in terms of his stability  improvement in terms of his nervous system condition.  The numbness and  weakness went away immediately.  The patient did not have slurred speech.  He continued to complain of feeling of dizziness and unsteadiness on his  feet, but that too has improved.   He has a dull headache.  He complained of chest pain which I have discussed  above.  This morning he complained of some diarrhea.  I had started Mylanta  II and Pepcid the night before in order to deal with his hiatal hernia.  That did not improve his pain and may have caused his GI distress.  These  will be discontinued.   CURRENT MEDICATIONS:  1. Verapamil 180 SR one daily.  2. Dilantin which will be given as Phenytek 300 mg once daily.  3. Aspirin, enteric-coated, 325 mg daily.   A four pronged cane will be delivered to his home by [facility].  He  is to walk with the cane.  He is to consume a low-salt diet.  He is to stop  smoking.  This been discussed with him by Jerry [name].  He should follow-up  with Jerry [name] at Jerry [name], telephone  number [phone] in 4-6 weeks and to call for an appointment.  He should  follow-up with Jerry [name] for any other medical problems.      Jerry [name], M.D.  Electronically Signed     WHH/MEDQ  D:  06/17/2006  T:  06/19/2006  Job:  [id number]   cc:   Windle [name], M.D.

## 2011-07-06 LAB — BASIC METABOLIC PANEL
BUN: 100 — ABNORMAL HIGH
BUN: 107 — ABNORMAL HIGH
BUN: 109 — ABNORMAL HIGH
BUN: 113 — ABNORMAL HIGH
BUN: 96 — ABNORMAL HIGH
CO2: 14 — ABNORMAL LOW
CO2: 16 — ABNORMAL LOW
CO2: 20
Calcium: 6.9 — ABNORMAL LOW
Calcium: 8 — ABNORMAL LOW
Chloride: 104
Chloride: 105
Chloride: 107
Chloride: 95 — ABNORMAL LOW
Chloride: 96
Creatinine, Ser: 7.36 — ABNORMAL HIGH
Creatinine, Ser: 7.44 — ABNORMAL HIGH
Creatinine, Ser: 7.56 — ABNORMAL HIGH
Creatinine, Ser: 7.64 — ABNORMAL HIGH
GFR calc Af Amer: 8 — ABNORMAL LOW
GFR calc Af Amer: 8 — ABNORMAL LOW
GFR calc Af Amer: 9 — ABNORMAL LOW
GFR calc Af Amer: 9 — ABNORMAL LOW
GFR calc non Af Amer: 7 — ABNORMAL LOW
GFR calc non Af Amer: 7 — ABNORMAL LOW
GFR calc non Af Amer: 8 — ABNORMAL LOW
Glucose, Bld: 89
Glucose, Bld: 98
Potassium: 3.6
Potassium: 3.9
Potassium: 4.2
Potassium: 4.3
Sodium: 128 — ABNORMAL LOW
Sodium: 129 — ABNORMAL LOW
Sodium: 133 — ABNORMAL LOW
Sodium: 135

## 2011-07-06 LAB — RENAL FUNCTION PANEL
Albumin: 1.7 — ABNORMAL LOW
Albumin: 1.8 — ABNORMAL LOW
Albumin: 1.8 — ABNORMAL LOW
BUN: 102 — ABNORMAL HIGH
BUN: 105 — ABNORMAL HIGH
CO2: 12 — ABNORMAL LOW
CO2: 13 — ABNORMAL LOW
CO2: 16 — ABNORMAL LOW
CO2: 18 — ABNORMAL LOW
Calcium: 7.2 — ABNORMAL LOW
Calcium: 7.8 — ABNORMAL LOW
Calcium: 8 — ABNORMAL LOW
Calcium: 8 — ABNORMAL LOW
Chloride: 103
Chloride: 103
Chloride: 103
Chloride: 109
Creatinine, Ser: 7.62 — ABNORMAL HIGH
Creatinine, Ser: 7.74 — ABNORMAL HIGH
Creatinine, Ser: 7.78 — ABNORMAL HIGH
GFR calc Af Amer: 10 — ABNORMAL LOW
GFR calc Af Amer: 11 — ABNORMAL LOW
GFR calc Af Amer: 14 — ABNORMAL LOW
GFR calc Af Amer: 18 — ABNORMAL LOW
GFR calc Af Amer: 8 — ABNORMAL LOW
GFR calc Af Amer: 9 — ABNORMAL LOW
GFR calc non Af Amer: 11 — ABNORMAL LOW
GFR calc non Af Amer: 7 — ABNORMAL LOW
GFR calc non Af Amer: 7 — ABNORMAL LOW
GFR calc non Af Amer: 8 — ABNORMAL LOW
GFR calc non Af Amer: 9 — ABNORMAL LOW
Glucose, Bld: 82
Glucose, Bld: 86
Phosphorus: 4.9 — ABNORMAL HIGH
Phosphorus: 6.7 — ABNORMAL HIGH
Phosphorus: 7 — ABNORMAL HIGH
Potassium: 3.4 — ABNORMAL LOW
Potassium: 3.8
Potassium: 5.3 — ABNORMAL HIGH
Sodium: 135
Sodium: 136
Sodium: 137
Sodium: 138

## 2011-07-06 LAB — SODIUM, URINE, RANDOM
Sodium, Ur: 26
Sodium, Ur: 30

## 2011-07-06 LAB — URINE MICROSCOPIC-ADD ON

## 2011-07-06 LAB — COMPREHENSIVE METABOLIC PANEL
ALT: 18
ALT: 21
AST: 29
AST: 33
Albumin: 2 — ABNORMAL LOW
Albumin: 2.2 — ABNORMAL LOW
Albumin: 2.6 — ABNORMAL LOW
Alkaline Phosphatase: 51
Alkaline Phosphatase: 74
Alkaline Phosphatase: 76
BUN: 40 — ABNORMAL HIGH
BUN: 76 — ABNORMAL HIGH
CO2: 22
CO2: 25
Calcium: 7.3 — ABNORMAL LOW
Calcium: 8.4
Chloride: 109
Chloride: 89 — ABNORMAL LOW
Creatinine, Ser: 3.36 — ABNORMAL HIGH
Creatinine, Ser: 6.26 — ABNORMAL HIGH
GFR calc Af Amer: 21 — ABNORMAL LOW
GFR calc non Af Amer: 18 — ABNORMAL LOW
Glucose, Bld: 102 — ABNORMAL HIGH
Potassium: 3.6
Potassium: 4
Potassium: 4
Sodium: 126 — ABNORMAL LOW
Sodium: 127 — ABNORMAL LOW
Total Bilirubin: 0.5
Total Bilirubin: 1.4 — ABNORMAL HIGH
Total Protein: 5.5 — ABNORMAL LOW
Total Protein: 5.9 — ABNORMAL LOW

## 2011-07-06 LAB — DIFFERENTIAL
Basophils Absolute: 0
Basophils Relative: 0
Basophils Relative: 0
Basophils Relative: 0
Basophils Relative: 0
Eosinophils Absolute: 0.1
Eosinophils Absolute: 0.1
Eosinophils Absolute: 0.2
Eosinophils Absolute: 0.2
Eosinophils Relative: 0
Eosinophils Relative: 1
Lymphocytes Relative: 11 — ABNORMAL LOW
Lymphocytes Relative: 4 — ABNORMAL LOW
Lymphocytes Relative: 5 — ABNORMAL LOW
Lymphs Abs: 0.8
Lymphs Abs: 1
Lymphs Abs: 1.3
Lymphs Abs: 1.3
Monocytes Absolute: 1.1 — ABNORMAL HIGH
Monocytes Absolute: 1.3 — ABNORMAL HIGH
Monocytes Absolute: 1.5 — ABNORMAL HIGH
Monocytes Relative: 3
Monocytes Relative: 6
Monocytes Relative: 7
Monocytes Relative: 9
Neutro Abs: 11.9 — ABNORMAL HIGH
Neutro Abs: 18.1 — ABNORMAL HIGH
Neutro Abs: 8.9 — ABNORMAL HIGH
Neutrophils Relative %: 79 — ABNORMAL HIGH
Neutrophils Relative %: 84 — ABNORMAL HIGH
Neutrophils Relative %: 85 — ABNORMAL HIGH
Neutrophils Relative %: 88 — ABNORMAL HIGH

## 2011-07-06 LAB — URINALYSIS, ROUTINE W REFLEX MICROSCOPIC
Glucose, UA: NEGATIVE
Glucose, UA: NEGATIVE
Glucose, UA: NEGATIVE
Ketones, ur: NEGATIVE
Ketones, ur: NEGATIVE
Nitrite: NEGATIVE
Protein, ur: NEGATIVE
Urobilinogen, UA: 0.2
pH: 5
pH: 5.5

## 2011-07-06 LAB — CBC
HCT: 31.8 — ABNORMAL LOW
HCT: 33.4 — ABNORMAL LOW
HCT: 36.3 — ABNORMAL LOW
HCT: 39.5
HCT: 46.4
Hemoglobin: 12 — ABNORMAL LOW
Hemoglobin: 12.5 — ABNORMAL LOW
Hemoglobin: 12.9 — ABNORMAL LOW
Hemoglobin: 14.4
MCHC: 33.6
MCHC: 33.9
MCHC: 34.1
MCHC: 34.1
MCHC: 34.3
MCV: 89.6
MCV: 89.9
MCV: 90.1
MCV: 90.1
MCV: 90.3
MCV: 90.6
MCV: 90.7
MCV: 91.4
Platelets: 129 — ABNORMAL LOW
Platelets: 157
Platelets: 228
Platelets: 247
Platelets: 309
Platelets: 317
Platelets: 332
Platelets: 349
RBC: 3.53 — ABNORMAL LOW
RBC: 3.68 — ABNORMAL LOW
RBC: 3.87 — ABNORMAL LOW
RBC: 3.87 — ABNORMAL LOW
RBC: 4 — ABNORMAL LOW
RBC: 4.16 — ABNORMAL LOW
RBC: 4.51
RBC: 4.64
RDW: 14.2 — ABNORMAL HIGH
RDW: 14.2 — ABNORMAL HIGH
WBC: 11.3 — ABNORMAL HIGH
WBC: 11.9 — ABNORMAL HIGH
WBC: 16.6 — ABNORMAL HIGH
WBC: 17 — ABNORMAL HIGH
WBC: 18.3 — ABNORMAL HIGH
WBC: 19.5 — ABNORMAL HIGH
WBC: 20.2 — ABNORMAL HIGH
WBC: 9.9
WBC: 9.9

## 2011-07-06 LAB — PROTEIN ELECTROPHORESIS, SERUM
Gamma Globulin: 11.6
M-Spike, %: NOT DETECTED

## 2011-07-06 LAB — PROTEIN ELECTROPH W RFLX QUANT IMMUNOGLOBULINS
Albumin ELP: 38 — ABNORMAL LOW
Alpha-1-Globulin: 10.6 — ABNORMAL HIGH
Alpha-1-Globulin: 11.4 — ABNORMAL HIGH
Alpha-2-Globulin: 16.9 — ABNORMAL HIGH
Beta 2: 6.1
Beta 2: 6.2
Gamma Globulin: 21.1 — ABNORMAL HIGH
Gamma Globulin: 22.7 — ABNORMAL HIGH
M-Spike, %: 0.75

## 2011-07-06 LAB — UIFE/LIGHT CHAINS/TP QN, 24-HR UR: Free Kappa Lt Chains,Ur: 15.9 — ABNORMAL HIGH (ref 0.04–1.51)

## 2011-07-06 LAB — MAGNESIUM
Magnesium: 2
Magnesium: 2
Magnesium: 2.5

## 2011-07-06 LAB — MICROALBUMIN / CREATININE URINE RATIO
Creatinine, Urine: 103.9
Microalb Creat Ratio: 437 — ABNORMAL HIGH
Microalb, Ur: 45.4 — ABNORMAL HIGH

## 2011-07-06 LAB — RAPID URINE DRUG SCREEN, HOSP PERFORMED
Barbiturates: NOT DETECTED
Opiates: NOT DETECTED
Tetrahydrocannabinol: NOT DETECTED

## 2011-07-06 LAB — PHOSPHORUS
Phosphorus: 2.8
Phosphorus: 2.8

## 2011-07-06 LAB — APTT: aPTT: 34

## 2011-07-06 LAB — CREATININE, SERUM
GFR calc Af Amer: 9 — ABNORMAL LOW
GFR calc non Af Amer: 7 — ABNORMAL LOW

## 2011-07-06 LAB — CARDIAC PANEL(CRET KIN+CKTOT+MB+TROPI)
CK, MB: 3.2
CK, MB: 3.4
CK, MB: 3.6
CK, MB: 5.4 — ABNORMAL HIGH
Relative Index: INVALID
Relative Index: INVALID
Total CK: 30
Total CK: 74
Total CK: 95
Troponin I: 0.11 — ABNORMAL HIGH
Troponin I: 0.18 — ABNORMAL HIGH
Troponin I: 0.29 — ABNORMAL HIGH

## 2011-07-06 LAB — URINE CULTURE

## 2011-07-06 LAB — ANTI-NUCLEAR AB-TITER (ANA TITER): ANA Titer 1: 1:40 {titer} — ABNORMAL HIGH

## 2011-07-06 LAB — BLOOD GAS, ARTERIAL
Acid-base deficit: 13.3 — ABNORMAL HIGH
Drawn by: 287601
FIO2: 0.21
pCO2 arterial: 22.5 — ABNORMAL LOW
pH, Arterial: 7.331 — ABNORMAL LOW
pO2, Arterial: 91.8

## 2011-07-06 LAB — CULTURE, BLOOD (ROUTINE X 2)

## 2011-07-06 LAB — DIC (DISSEMINATED INTRAVASCULAR COAGULATION)PANEL
Platelets: 155
Smear Review: NONE SEEN

## 2011-07-06 LAB — HEPATITIS PANEL, ACUTE
Hep B C IgM: NEGATIVE
Hepatitis B Surface Ag: NEGATIVE

## 2011-07-06 LAB — OSMOLALITY: Osmolality: 272 — ABNORMAL LOW

## 2011-07-06 LAB — PROTIME-INR
INR: 1.2
Prothrombin Time: 14.3

## 2011-07-06 LAB — ANA: Anti Nuclear Antibody(ANA): POSITIVE — AB

## 2011-07-06 LAB — IGG, IGA, IGM
IgA: 209
IgG (Immunoglobin G), Serum: 1320
IgG (Immunoglobin G), Serum: 1380
IgM, Serum: 127

## 2011-07-06 LAB — TYPE AND SCREEN
ABO/RH(D): A POS
Antibody Screen: NEGATIVE

## 2011-07-06 LAB — IMMUNOFIXATION ADD-ON

## 2011-07-06 LAB — TECHNOLOGIST SMEAR REVIEW

## 2011-07-06 LAB — LIPID PANEL
HDL: 24 — ABNORMAL LOW
Total CHOL/HDL Ratio: 4.4
Triglycerides: 134
VLDL: 27

## 2011-07-06 LAB — CLOSTRIDIUM DIFFICILE EIA
C difficile Toxins A+B, EIA: NEGATIVE
C difficile Toxins A+B, EIA: NEGATIVE

## 2011-07-06 LAB — CK TOTAL AND CKMB (NOT AT ARMC): CK, MB: 3.9

## 2011-07-06 LAB — TSH: TSH: 2.736

## 2013-01-11 ENCOUNTER — Emergency Department (HOSPITAL_COMMUNITY): Payer: Medicare Other

## 2013-01-11 ENCOUNTER — Emergency Department (HOSPITAL_COMMUNITY)
Admission: EM | Admit: 2013-01-11 | Discharge: 2013-01-11 | Disposition: A | Discharge status: Home / Self Care | Payer: Medicare Other | Attending: Emergency Medicine | Admitting: Emergency Medicine

## 2013-01-11 ENCOUNTER — Encounter (HOSPITAL_COMMUNITY): Payer: Self-pay | Admitting: *Deleted

## 2013-01-11 DIAGNOSIS — I10 Essential (primary) hypertension: Secondary | ICD-10-CM | POA: Insufficient documentation

## 2013-01-11 DIAGNOSIS — Z8739 Personal history of other diseases of the musculoskeletal system and connective tissue: Secondary | ICD-10-CM | POA: Insufficient documentation

## 2013-01-11 DIAGNOSIS — Z8719 Personal history of other diseases of the digestive system: Secondary | ICD-10-CM | POA: Insufficient documentation

## 2013-01-11 DIAGNOSIS — M545 Low back pain, unspecified: Secondary | ICD-10-CM

## 2013-01-11 DIAGNOSIS — Z7982 Long term (current) use of aspirin: Secondary | ICD-10-CM | POA: Insufficient documentation

## 2013-01-11 DIAGNOSIS — Z79899 Other long term (current) drug therapy: Secondary | ICD-10-CM | POA: Insufficient documentation

## 2013-01-11 HISTORY — DX: Unspecified osteoarthritis, unspecified site: M19.90

## 2013-01-11 HISTORY — DX: Unspecified abdominal hernia without obstruction or gangrene: K46.9

## 2013-01-11 HISTORY — DX: Essential (primary) hypertension: I10

## 2013-01-11 MED ORDER — OXYCODONE-ACETAMINOPHEN 5-325 MG PO TABS: 1.0000 | ORAL_TABLET | ORAL | Status: DC | PRN

## 2013-01-11 MED ORDER — OXYCODONE-ACETAMINOPHEN 5-325 MG PO TABS
1.0000 | ORAL_TABLET | Freq: Once | ORAL | Status: AC
  Administered 2013-01-11: 1 via ORAL
  Filled 2013-01-11: qty 1

## 2013-01-11 NOTE — ED Notes (Signed)
Per EMS- pt has arthritis and chronic back pain. Pt has had increased pain over the last 3 weeks. Pt states that he had difficulty walking to the restroom this morning. Denies and falls or injury recently.

## 2013-01-11 NOTE — ED Provider Notes (Signed)
History     CSN: [id number]  Arrival date & time 01/11/13  1106   First MD Initiated Contact with Patient 01/11/13 1141      Chief Complaint  Patient presents with  . Back Pain    (Consider location/radiation/quality/duration/timing/severity/associated sxs/prior treatment) HPI This 79 year old male has chronic low back pain gradually worsening 24 hours a day for the last 2 weeks compared to his baseline, at baseline he is being controlled with Tylenol or over-the-counter anti-inflammatories, the last 2 weeks his over-the-counter medicines are no longer working, he is no fever no trauma no chest pain cough shortness breath abdominal pain vomiting or change in bowel or bladder function with no radiation of his pain down his legs no focal or lateralizing weakness or numbness to his legs and no treatment prior to arrival, he would like a stronger pain medicine, he is still able to walk unassisted and lives with his son who can help him if needed. Past Medical History  Diagnosis Date  . Hypertension   . Arthritis   . Hernia     History reviewed. No pertinent past surgical history.  No family history on file.  History  Substance Use Topics  . Smoking status: Not on file  . Smokeless tobacco: Not on file  . Alcohol Use: Not on file      Review of Systems 10 Systems reviewed and are negative for acute change except as noted in the HPI. Allergies  Penicillins and Tetanus toxoids  Home Medications   Current Outpatient Rx  Name  Route  Sig  Dispense  Refill  . acetaminophen (TYLENOL) 650 MG CR tablet   Oral   Take 650 mg by mouth every 8 (eight) hours as needed for pain.         [name] amLODipine (NORVASC) 10 MG tablet   Oral   Take 10 mg by mouth daily.         [name] aspirin EC 81 MG tablet   Oral   Take 81 mg by mouth daily.         . dabigatran (PRADAXA) 150 MG CAPS   Oral   Take 150 mg by mouth every 12 (twelve) hours.         [name] doxazosin (CARDURA) 8 MG tablet  Oral   Take 8 mg by mouth at bedtime.         . metoprolol (LOPRESSOR) 100 MG tablet   Oral   Take 100 mg by mouth every 12 (twelve) hours.         [name] oxyCODONE-acetaminophen (PERCOCET) 5-325 MG per tablet   Oral   Take 1 tablet by mouth every 4 (four) hours as needed for pain.   20 tablet   0     BP 124/56  Pulse 59  Temp(Src) 97.6 F (36.4 C) (Oral)  Resp 18  SpO2 93%  Physical Exam  Nursing note and vitals reviewed. Constitutional:  Awake, alert, nontoxic appearance with baseline speech.  HENT:  Head: Atraumatic.  Eyes: Pupils are equal, round, and reactive to light. Right eye exhibits no discharge. Left eye exhibits no discharge.  Neck: Neck supple.  Cardiovascular: Normal rate and regular rhythm.   No murmur heard. Pulmonary/Chest: Effort normal and breath sounds normal. No respiratory distress. He has no wheezes. He has no rales. He exhibits no tenderness.  Abdominal: Soft. Bowel sounds are normal. He exhibits no mass. There is no tenderness. There is no rebound.  Musculoskeletal: He exhibits tenderness. He exhibits no  edema.       Thoracic back: He exhibits no tenderness.       Lumbar back: He exhibits no tenderness.  Bilateral lower extremities non tender without new rashes or color change, baseline ROM with CR<2 secs all digits bilaterally, sensation baseline light touch bilaterally for pt, DTR's symmetric and intact bilaterally KJ / AJ, motor symmetric bilateral 5 / 5 hip flexion, quadriceps, hamstrings, EHL, foot dorsiflexion, foot plantarflexion; back has diffuse lower lumbar tenderness without rash  Neurological:  Mental status baseline for patient.  Upper extremity motor strength and sensation intact and symmetric bilaterally.  Skin: No rash noted.  Psychiatric: He has a normal mood and affect.    ED Course  Procedures (including critical care time)  Labs Reviewed - No data to display No results found.   1. Lumbar pain       MDM  Patient /  Family / Caregiver informed of clinical course, understand medical decision-making process, and agree with plan.  I doubt any other EMC precluding discharge at this time including, but not necessarily limited to the following:SBI, AAA, cauda equina.         Hurman Horn, MD 01/16/13 [phone]

## 2013-02-19 ENCOUNTER — Encounter (HOSPITAL_COMMUNITY): Payer: Self-pay | Admitting: Family Medicine

## 2013-02-19 ENCOUNTER — Inpatient Hospital Stay (HOSPITAL_COMMUNITY)
Admission: EM | Admit: 2013-02-19 | Discharge: 2013-02-27 | DRG: 083 | Disposition: A | Discharge status: Skilled Nursing Facility | Payer: Medicare Other | Attending: General Surgery | Admitting: General Surgery

## 2013-02-19 ENCOUNTER — Inpatient Hospital Stay (HOSPITAL_COMMUNITY): Payer: Medicare Other

## 2013-02-19 ENCOUNTER — Emergency Department (HOSPITAL_COMMUNITY): Payer: Medicare Other

## 2013-02-19 DIAGNOSIS — S066X9A Traumatic subarachnoid hemorrhage with loss of consciousness of unspecified duration, initial encounter: Secondary | ICD-10-CM | POA: Diagnosis present

## 2013-02-19 DIAGNOSIS — R1310 Dysphagia, unspecified: Secondary | ICD-10-CM | POA: Diagnosis present

## 2013-02-19 DIAGNOSIS — T148XXA Other injury of unspecified body region, initial encounter: Secondary | ICD-10-CM | POA: Diagnosis present

## 2013-02-19 DIAGNOSIS — S139XXA Sprain of joints and ligaments of unspecified parts of neck, initial encounter: Secondary | ICD-10-CM | POA: Diagnosis present

## 2013-02-19 DIAGNOSIS — D62 Acute posthemorrhagic anemia: Secondary | ICD-10-CM | POA: Diagnosis present

## 2013-02-19 DIAGNOSIS — Z7901 Long term (current) use of anticoagulants: Secondary | ICD-10-CM

## 2013-02-19 DIAGNOSIS — S0180XA Unspecified open wound of other part of head, initial encounter: Secondary | ICD-10-CM | POA: Diagnosis present

## 2013-02-19 DIAGNOSIS — S41109A Unspecified open wound of unspecified upper arm, initial encounter: Secondary | ICD-10-CM

## 2013-02-19 DIAGNOSIS — S0181XA Laceration without foreign body of other part of head, initial encounter: Secondary | ICD-10-CM

## 2013-02-19 DIAGNOSIS — M25519 Pain in unspecified shoulder: Secondary | ICD-10-CM | POA: Diagnosis present

## 2013-02-19 DIAGNOSIS — S06300A Unspecified focal traumatic brain injury without loss of consciousness, initial encounter: Principal | ICD-10-CM | POA: Diagnosis present

## 2013-02-19 DIAGNOSIS — I10 Essential (primary) hypertension: Secondary | ICD-10-CM | POA: Insufficient documentation

## 2013-02-19 DIAGNOSIS — S42002A Fracture of unspecified part of left clavicle, initial encounter for closed fracture: Secondary | ICD-10-CM

## 2013-02-19 DIAGNOSIS — I4891 Unspecified atrial fibrillation: Secondary | ICD-10-CM | POA: Diagnosis present

## 2013-02-19 DIAGNOSIS — N390 Urinary tract infection, site not specified: Secondary | ICD-10-CM | POA: Diagnosis not present

## 2013-02-19 DIAGNOSIS — A498 Other bacterial infections of unspecified site: Secondary | ICD-10-CM | POA: Diagnosis present

## 2013-02-19 DIAGNOSIS — Y998 Other external cause status: Secondary | ICD-10-CM

## 2013-02-19 DIAGNOSIS — S42013A Anterior displaced fracture of sternal end of unspecified clavicle, initial encounter for closed fracture: Secondary | ICD-10-CM | POA: Diagnosis present

## 2013-02-19 DIAGNOSIS — S51009A Unspecified open wound of unspecified elbow, initial encounter: Secondary | ICD-10-CM | POA: Diagnosis present

## 2013-02-19 DIAGNOSIS — G8929 Other chronic pain: Secondary | ICD-10-CM | POA: Diagnosis present

## 2013-02-19 DIAGNOSIS — Z79899 Other long term (current) drug therapy: Secondary | ICD-10-CM

## 2013-02-19 DIAGNOSIS — Y92410 Unspecified street and highway as the place of occurrence of the external cause: Secondary | ICD-10-CM

## 2013-02-19 DIAGNOSIS — I498 Other specified cardiac arrhythmias: Secondary | ICD-10-CM | POA: Diagnosis present

## 2013-02-19 DIAGNOSIS — M129 Arthropathy, unspecified: Secondary | ICD-10-CM | POA: Diagnosis present

## 2013-02-19 DIAGNOSIS — M549 Dorsalgia, unspecified: Secondary | ICD-10-CM | POA: Diagnosis present

## 2013-02-19 LAB — COMPREHENSIVE METABOLIC PANEL
AST: 16 U/L (ref 0–37)
Albumin: 2.8 g/dL — ABNORMAL LOW (ref 3.5–5.2)
Alkaline Phosphatase: 63 U/L (ref 39–117)
Chloride: 103 mEq/L (ref 96–112)
Creatinine, Ser: 0.95 mg/dL (ref 0.50–1.35)
Potassium: 3.5 mEq/L (ref 3.5–5.1)
Total Bilirubin: 0.6 mg/dL (ref 0.3–1.2)
Total Protein: 6.4 g/dL (ref 6.0–8.3)

## 2013-02-19 LAB — CBC WITH DIFFERENTIAL/PLATELET
Basophils Absolute: 0.1 10*3/uL (ref 0.0–0.1)
Basophils Relative: 1 % (ref 0–1)
Eosinophils Absolute: 0.1 10*3/uL (ref 0.0–0.7)
MCHC: 35 g/dL (ref 30.0–36.0)
Neutro Abs: 8.6 10*3/uL — ABNORMAL HIGH (ref 1.7–7.7)
Neutrophils Relative %: 68 % (ref 43–77)
RDW: 13.5 % (ref 11.5–15.5)

## 2013-02-19 LAB — PROTIME-INR: INR: 1.43 (ref 0.00–1.49)

## 2013-02-19 LAB — TYPE AND SCREEN: ABO/RH(D): A POS

## 2013-02-19 MED ORDER — DOCUSATE SODIUM 100 MG PO CAPS
100.0000 mg | ORAL_CAPSULE | Freq: Two times a day (BID) | ORAL | Status: DC
  Administered 2013-02-19 – 2013-02-27 (×11): 100 mg via ORAL
  Filled 2013-02-19 (×16): qty 1

## 2013-02-19 MED ORDER — BACITRACIN ZINC 500 UNIT/GM EX OINT
TOPICAL_OINTMENT | Freq: Two times a day (BID) | CUTANEOUS | Status: DC
  Administered 2013-02-19 – 2013-02-27 (×16): via TOPICAL
  Filled 2013-02-19 (×3): qty 15

## 2013-02-19 MED ORDER — DOXAZOSIN MESYLATE 8 MG PO TABS
8.0000 mg | ORAL_TABLET | Freq: Every day | ORAL | Status: DC
  Administered 2013-02-19 – 2013-02-26 (×5): 8 mg via ORAL
  Filled 2013-02-19 (×9): qty 1

## 2013-02-19 MED ORDER — ANTIINHIBITOR COAGULANT CMPLX IV SOLR: 50.0000 [IU]/kg | Freq: Once | INTRAVENOUS | Status: DC

## 2013-02-19 MED ORDER — PANTOPRAZOLE SODIUM 40 MG PO TBEC
40.0000 mg | DELAYED_RELEASE_TABLET | Freq: Every day | ORAL | Status: DC
  Administered 2013-02-19 – 2013-02-21 (×3): 40 mg via ORAL
  Filled 2013-02-19 (×3): qty 1

## 2013-02-19 MED ORDER — MORPHINE SULFATE 2 MG/ML IJ SOLN
2.0000 mg | INTRAMUSCULAR | Status: DC | PRN
  Administered 2013-02-25: 2 mg via INTRAVENOUS
  Filled 2013-02-19 (×2): qty 1

## 2013-02-19 MED ORDER — SODIUM CHLORIDE 0.9 % IJ SOLN
3.0000 mL | Freq: Two times a day (BID) | INTRAMUSCULAR | Status: DC
  Administered 2013-02-19 – 2013-02-20 (×3): 3 mL via INTRAVENOUS
  Administered 2013-02-21: 5 mL via INTRAVENOUS
  Administered 2013-02-21 – 2013-02-24 (×5): 3 mL via INTRAVENOUS

## 2013-02-19 MED ORDER — METOPROLOL TARTRATE 100 MG PO TABS
100.0000 mg | ORAL_TABLET | Freq: Two times a day (BID) | ORAL | Status: DC
  Administered 2013-02-19 – 2013-02-21 (×5): 100 mg via ORAL
  Filled 2013-02-19 (×9): qty 1

## 2013-02-19 MED ORDER — OXYCODONE HCL 5 MG PO TABS
5.0000 mg | ORAL_TABLET | ORAL | Status: DC | PRN
  Administered 2013-02-25: 10 mg via ORAL
  Filled 2013-02-19: qty 2

## 2013-02-19 MED ORDER — AMLODIPINE BESYLATE 10 MG PO TABS
10.0000 mg | ORAL_TABLET | Freq: Every day | ORAL | Status: DC
  Administered 2013-02-19 – 2013-02-27 (×7): 10 mg via ORAL
  Filled 2013-02-19 (×9): qty 1

## 2013-02-19 MED ORDER — PANTOPRAZOLE SODIUM 40 MG IV SOLR
40.0000 mg | Freq: Every day | INTRAVENOUS | Status: DC
Start: 2013-02-19 — End: 2013-02-22
  Filled 2013-02-19 (×3): qty 40

## 2013-02-19 MED ORDER — ONDANSETRON HCL 4 MG/2ML IJ SOLN
4.0000 mg | Freq: Four times a day (QID) | INTRAMUSCULAR | Status: DC | PRN
  Administered 2013-02-24: 4 mg via INTRAVENOUS
  Filled 2013-02-19: qty 2

## 2013-02-19 MED ORDER — IOHEXOL 300 MG/ML  SOLN
80.0000 mL | Freq: Once | INTRAMUSCULAR | Status: AC | PRN
  Administered 2013-02-19: 80 mL via INTRAVENOUS

## 2013-02-19 MED ORDER — SODIUM CHLORIDE 0.9 % IV SOLN: 250.0000 mL | INTRAVENOUS | Status: DC | PRN

## 2013-02-19 MED ORDER — POLYETHYLENE GLYCOL 3350 17 G PO PACK
17.0000 g | PACK | Freq: Every day | ORAL | Status: DC
  Administered 2013-02-26 – 2013-02-27 (×2): 17 g via ORAL
  Filled 2013-02-19 (×9): qty 1

## 2013-02-19 MED ORDER — SODIUM CHLORIDE 0.9 % IJ SOLN: 3.0000 mL | INTRAMUSCULAR | Status: DC | PRN

## 2013-02-19 MED ORDER — EMPTY CONTAINERS FLEXIBLE MISC
1886.0000 [IU] | Status: AC
  Administered 2013-02-19: 1886 [IU] via INTRAVENOUS
  Filled 2013-02-19 (×2): qty 1886

## 2013-02-19 MED ORDER — ONDANSETRON HCL 4 MG PO TABS: 4.0000 mg | ORAL_TABLET | Freq: Four times a day (QID) | ORAL | Status: DC | PRN
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
## 2013-02-19 NOTE — ED Notes (Signed)
Vital signs stable. 

## 2013-02-19 NOTE — Procedures (Signed)
This patient has been seen and I agree with the findings and treatment plan.  Keighan O. Lucie Friedlander, III, MD, FACS [phone] (pager) [phone] (direct pager) Trauma Surgeon  
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
## 2013-02-19 NOTE — ED Notes (Signed)
This patients old ekgs have not been crossed over from [facility] to epic.  Secretary found old ekg in [facility].  None were found in epic.

## 2013-02-19 NOTE — H&P (Signed)
Patient at risk for delayed bleed.  Will repeat scan of head at any sign of worsening neurological function.  No reversal for Pradaxa available.  This patient has been seen and I agree with the findings and treatment plan.  Marta Lamas. Gae Bon, MD, FACS [phone] (pager) [phone] (direct pager) Trauma Surgeon

## 2013-02-19 NOTE — Procedures (Signed)
Procedure: Closure of facial lacerations, attempted repair of LUE skin tear, sharp debridement of LUE skin tear  Indication: Multiple lacerations  Surgeon: Charma Igo, PA-C  Assist: None  Anesthesia: ~82ml lidocaine w/epi  EBL: Minimal  Complications: None  Procedure: Written consent was obtained from daughter who was present. Anesthesia was infiltrated into the subcutaneous tissues and the areas were prepped and draped sterilely. 6-0 Prolene interruped sutures were used to approximate 2 lacerations on the left face measuring approximately 1cm each. Attention was then turned to the left elbow skin tear. The skin here looked viable so I attempted to tack it down over the wound using the Prolene suture but the flap kept tearing and so the attempt was abandoned. A second skin tear more distal had a flap that was clearly nonviable and this was sharply debrided. Patient tolerated the procedure well.   Freeman Caldron, PA-C Pager: [phone] General Trauma PA Pager: [phone]

## 2013-02-19 NOTE — ED Notes (Signed)
Patient transported to X-ray 

## 2013-02-20 ENCOUNTER — Inpatient Hospital Stay (HOSPITAL_COMMUNITY): Payer: Medicare Other

## 2013-02-20 DIAGNOSIS — I4891 Unspecified atrial fibrillation: Secondary | ICD-10-CM

## 2013-02-20 DIAGNOSIS — S06330A Contusion and laceration of cerebrum, unspecified, without loss of consciousness, initial encounter: Secondary | ICD-10-CM

## 2013-02-20 DIAGNOSIS — S42002A Fracture of unspecified part of left clavicle, initial encounter for closed fracture: Secondary | ICD-10-CM

## 2013-02-20 DIAGNOSIS — S139XXA Sprain of joints and ligaments of unspecified parts of neck, initial encounter: Secondary | ICD-10-CM

## 2013-02-20 LAB — CBC
MCH: 30.6 pg (ref 26.0–34.0)
MCV: 87.7 fL (ref 78.0–100.0)
Platelets: 158 10*3/uL (ref 150–400)
RDW: 13.4 % (ref 11.5–15.5)
WBC: 8.9 10*3/uL (ref 4.0–10.5)

## 2013-02-20 LAB — PREPARE FRESH FROZEN PLASMA
Unit division: 0
Unit division: 0

## 2013-02-20 LAB — BASIC METABOLIC PANEL
BUN: 8 mg/dL (ref 6–23)
Creatinine, Ser: 0.83 mg/dL (ref 0.50–1.35)
GFR calc Af Amer: 90 mL/min — ABNORMAL LOW (ref >90)
GFR calc non Af Amer: 77 mL/min — ABNORMAL LOW (ref >90)

## 2013-02-20 LAB — HEMOGLOBIN AND HEMATOCRIT, BLOOD
HCT: 29.8 % — ABNORMAL LOW (ref 39.0–52.0)
Hemoglobin: 10.4 g/dL — ABNORMAL LOW (ref 13.0–17.0)

## 2013-02-20 MED ORDER — SODIUM CHLORIDE 0.9 % IV SOLN
INTRAVENOUS | Status: DC
  Administered 2013-02-20 – 2013-02-25 (×7): via INTRAVENOUS
  Filled 2013-02-20 (×13): qty 1000
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
## 2013-02-20 NOTE — Progress Notes (Signed)
Head CT Scan slightly worse per radiologist. Dr Wynetta Emery notified and will look at it. Pt remains confused as per previous Head CT  Scan.

## 2013-02-20 NOTE — Progress Notes (Signed)
Rehab Admissions Coordinator Note:  Patient was screened by Trish Mage for appropriateness for an Inpatient Acute Rehab Consult.  Noted PT recommending CIR.  At this time, we are recommending Inpatient Rehab consult.  Lelon Frohlich M 02/20/2013, 1:20 PM  I can be reached at [phone].
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
## 2013-02-20 NOTE — Progress Notes (Signed)
Patient ID: Jerry [name], male   DOB: [birth year], 79 y.o.   MRN: [id number] I called his daughter, [name], per her request and updated her on the plan of care.  I also spoke to family members at the bedside and discussed the plan of care with Dr. Wynetta Emery. [name], MD, MPH, FACS Pager: [phone]

## 2013-02-20 NOTE — Progress Notes (Signed)
Pt reporting tenderness in Left shoulder, ecchymosis and some edema noted. MD paged, order received for xray of Left shoulder.   Jerry [name]

## 2013-02-20 NOTE — Progress Notes (Signed)
Patient ID: Jerry [name], male   DOB: 1924/06/23, 79 y.o.   MRN: [id number] Obtained L shoulder film due to contusion/tenderness.  X-rays show distal L clavical FX.  Will place sling and consult orthopedics. [name], MD, MPH, FACS Pager: [phone]
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
## 2013-02-20 NOTE — Progress Notes (Signed)
Orthopedic Tech Progress Note Patient Details:  DRAYK HUMBARGER 19-Apr-1924 [id number] Left arm sling applied. Tolerated well. Patient to wear for comfort Ortho Devices Type of Ortho Device: Arm sling Ortho Device/Splint Location: Left Ortho Device/Splint Interventions: Application   Asia R Thompson 02/20/2013, 3:02 PM
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
## 2013-02-20 NOTE — Consult Note (Signed)
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 insurance prior authorization may be required for reimbursement for recommended care.  Comment: I spoke with multiple children in his room. It is my impression that none of them have any intention of bringing him home after I discussed probable dc functional levels and care needs. They will discuss further, but I believe they are leaning toward SNF.            Ranelle Oyster, MD, [facility] Physical Medicine & Rehabilitation     02/20/2013

## 2013-02-20 NOTE — Progress Notes (Signed)
Pt retaining urine. Bladder scanned for 399 cc's. Attempted to make pt void on commode but pt only able to void 25cc's. Order given to place foley by Dr Janee Morn. Dr Janee Morn notified of  Pt left elbow. Pt left elbow showing ligaments and and what looks like bone per my assessment. Will continue dressing orders per Dr Janee Morn and MD will look at elbow in am.

## 2013-02-21 ENCOUNTER — Inpatient Hospital Stay (HOSPITAL_COMMUNITY): Payer: Medicare Other

## 2013-02-21 DIAGNOSIS — S069X9A Unspecified intracranial injury with loss of consciousness of unspecified duration, initial encounter: Secondary | ICD-10-CM

## 2013-02-21 LAB — URINE MICROSCOPIC-ADD ON

## 2013-02-21 LAB — URINALYSIS, ROUTINE W REFLEX MICROSCOPIC
Bilirubin Urine: NEGATIVE
Nitrite: NEGATIVE
Specific Gravity, Urine: 1.011 (ref 1.005–1.030)
Urobilinogen, UA: 4 mg/dL — ABNORMAL HIGH (ref 0.0–1.0)

## 2013-02-21 LAB — BASIC METABOLIC PANEL
BUN: 9 mg/dL (ref 6–23)
GFR calc Af Amer: >90 mL/min (ref >90)
GFR calc non Af Amer: 80 mL/min — ABNORMAL LOW (ref >90)
Potassium: 3.4 mEq/L — ABNORMAL LOW (ref 3.5–5.1)
Sodium: 135 mEq/L (ref 135–145)

## 2013-02-21 LAB — CBC
MCHC: 35.6 g/dL (ref 30.0–36.0)
Platelets: 160 10*3/uL (ref 150–400)
RDW: 13.4 % (ref 11.5–15.5)

## 2013-02-21 NOTE — Progress Notes (Signed)
Pt holding food in mouth, did well with applesauce and thin liquids. MD made aware. SLP eval ordered.   Jerry [name]

## 2013-02-21 NOTE — Progress Notes (Signed)
Physical Therapy Treatment Patient Details Name: Jerry [name] MRN: [id number] DOB: 03-30-1924 Today's Date: 02/21/2013 Time: 1413-1440 PT Time Calculation (min): 27 min  PT Assessment / Plan / Recommendation Comments on Treatment Session  Patient able to improve with gait.  However continues to have decreased safety awareness, decreased cognition.  Agree with need for Inpatient Rehab stay prior to discharge home.    Follow Up Recommendations  CIR     Does the patient have the potential to tolerate intense rehabilitation     Barriers to Discharge        Equipment Recommendations  Rolling walker with 5" wheels    Recommendations for Other Services Rehab consult  Frequency Min 3X/week   Plan Discharge plan remains appropriate;Frequency remains appropriate    Precautions / Restrictions Precautions Precautions: Fall Precaution Comments: Lt distal clavicle fx - in sling Required Braces or Orthoses:  (Cervical brace discontinued) Restrictions Weight Bearing Restrictions: No Other Position/Activity Restrictions: Sling LUE for comfort   Pertinent Vitals/Pain     Mobility  Bed Mobility Bed Mobility: Supine to Sit Supine to Sit: 3: Mod assist;With rails;HOB elevated Details for Bed Mobility Assistance: Verbal and tactile cues for technique. OOB on right side.  Assist to raise trunk from bed. Transfers Transfers: Sit to Stand;Stand to Sit Sit to Stand: 1: +2 Total assist;With upper extremity assist;From bed Sit to Stand: Patient Percentage: 60% Stand to Sit: 1: +2 Total assist;With upper extremity assist;With armrests;To chair/3-in-1 Stand to Sit: Patient Percentage: 80% Details for Transfer Assistance: Verbal and tactile cues for hand placement.  Assist to rise to standing Ambulation/Gait Ambulation/Gait Assistance: 4: Min assist (+1 for lines and chair) Ambulation Distance (Feet): 130 Feet Assistive device: Rolling walker Ambulation/Gait Assistance Details: Verbal and  tactile cues for safe use of RW.  Assist to maneuver RW during turns.  Cues to stay inside RW. Gait Pattern: Step-through pattern;Trunk flexed;Decreased stride length      PT Goals Acute Rehab PT Goals Pt will go Supine/Side to Sit: with supervision PT Goal: Supine/Side to Sit - Progress: Progressing toward goal Pt will go Sit to Stand: with supervision PT Goal: Sit to Stand - Progress: Progressing toward goal Pt will Stand: with supervision;with unilateral upper extremity support;1 - 2 min PT Goal: Stand - Progress: Progressing toward goal Pt will Ambulate: with supervision;with rolling walker;>150 feet PT Goal: Ambulate - Progress: Progressing toward goal  Visit Information  Last PT Received On: 02/21/13 Assistance Needed: +2    Subjective Data  Subjective: "OK"  Short responses to questions.   Cognition  Cognition Arousal/Alertness: Awake/alert Behavior During Therapy: Restless Overall Cognitive Status: Impaired/Different from baseline Area of Impairment: Orientation;Attention;Following commands;Memory;Safety/judgement Orientation Level: Disoriented to;Time;Situation Current Attention Level: Sustained (Easily distracted ) Memory: Decreased short-term memory Following Commands: Follows one step commands with increased time Safety/Judgement: Decreased awareness of safety General Comments: Easily distracted by staff in hallway.  Multiple cues for safety needed.  Does not recognize need for assistance    Balance     End of Session PT - End of Session Equipment Utilized During Treatment: Gait belt Activity Tolerance: Patient tolerated treatment well Patient left: in chair;with chair alarm set;with call bell/phone within reach;with nursing in room Nurse Communication: Mobility status   GP     Vena Austria 02/21/2013, 5:26 PM Durenda Hurt. Renaldo Fiddler, [facility] Acute Rehab Services Pager [phone]

## 2013-02-21 NOTE — Progress Notes (Signed)
Speech Language Pathology Treatment Patient Details Name: SYDNEY AZURE MRN: [id number] DOB: 04/12/1924 Today's Date: 02/21/2013 Time: 0850-0900 SLP Time Calculation (min): 10 min  Assessment / Plan / Recommendation Clinical Impression  Brief cognitive treatment complete focused on differential diagnosis of abilitites as patient waiting for visit from MD due to mentation changes. Patient oriented to self independently however required max cueing for orientation to place, time, and situation today. Extremely poor carryover/functional recall of this basic information following only a 30 second-1 minute delay. Max tactile cueing required for carryout of basic one step commands. Mentation very different that during initial evaluation. RN and MD made aware. Unclear origin at this time. SLP will continue to f/u.     SLP Plan  Continue with current plan of care    Pertinent Vitals/Pain None reported  SLP Goals  SLP Goals Potential to Achieve Goals: Fair Potential Considerations: Other (comment) (change in status from initial evaluation) Progress/Goals/Alternative treatment plan discussed with pt/caregiver and they: Agree SLP Goal #1: Patient will demonstrate anticipatory awareness of needs after d/c as related to acute injury with mod cues.  SLP Goal #1 - Progress: Progressing toward goal SLP Goal #2: Patient will demonstrate mild-moderately complex reasoning skills as related to functional ADLS wtih mod cueing. SLP Goal #2 - Progress: Progressing toward goal        Treatment Treatment focused on: Cognition Skilled Treatment: Brief cognitive treatment complete focused on differential diagnosis of abilitites as patient waiting for visit from MD due to mentation changes. Patient oriented to self independently however required max cueing for orientation to place, time, and situation today. Extremely poor carryover/functional recall of this basic information following only a 30 second-1 minute  delay. Max tactile cueing required for carryout of basic one step commands. Mentation very different that during initial evaluation. RN and MD made aware. Unclear origin at this time. SLP will continue to f/u.    GO   [name] MA, CCC-SLP [phone]   [name] [name] 02/21/2013, 9:43 AM

## 2013-02-21 NOTE — Progress Notes (Signed)
Pt holding puree food in mouth, but continues to tolerate thin liquids and tolerated magic cup ice cream well. Daughter at bedside. RN holding puree food at this time but continuing with thin liquids and ice cream as tolerated.   Jerry [name]

## 2013-02-21 NOTE — Progress Notes (Signed)
c-collar d/c per MD order.   Jerry [name]

## 2013-02-21 NOTE — Plan of Care (Signed)
Problem: Phase II Progression Outcomes Goal: Discharge plan established Outcome: Progressing Rehab plans
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
## 2013-02-21 NOTE — Progress Notes (Signed)
PT had run of SVT to 170s lasting about a minute. Vital signs stable.Spoke with Trauma MD Oncall(Dr Andrey Campanile). Will check BMET and Mg stat . Will also institute CIWA protocol per MD due to pt and family reporting that pt drinks 5-6 beers a day.

## 2013-02-22 LAB — BASIC METABOLIC PANEL
BUN: 13 mg/dL (ref 6–23)
Calcium: 8.5 mg/dL (ref 8.4–10.5)
Creatinine, Ser: 0.8 mg/dL (ref 0.50–1.35)
GFR calc Af Amer: >90 mL/min (ref >90)
GFR calc non Af Amer: 79 mL/min — ABNORMAL LOW (ref >90)
Potassium: 3.5 mEq/L (ref 3.5–5.1)

## 2013-02-22 MED ORDER — METOPROLOL TARTRATE 1 MG/ML IV SOLN
5.0000 mg | Freq: Once | INTRAVENOUS | Status: DC
  Filled 2013-02-22: qty 5

## 2013-02-22 MED ORDER — LORAZEPAM 1 MG PO TABS: 1.0000 mg | ORAL_TABLET | Freq: Four times a day (QID) | ORAL | Status: AC | PRN

## 2013-02-22 MED ORDER — FOLIC ACID 1 MG PO TABS
1.0000 mg | ORAL_TABLET | Freq: Every day | ORAL | Status: DC
  Administered 2013-02-24 – 2013-02-27 (×4): 1 mg via ORAL
  Filled 2013-02-22 (×6): qty 1

## 2013-02-22 MED ORDER — LORAZEPAM 2 MG/ML IJ SOLN
1.0000 mg | Freq: Four times a day (QID) | INTRAMUSCULAR | Status: AC | PRN
  Administered 2013-02-22: 1 mg via INTRAVENOUS
  Filled 2013-02-22: qty 1

## 2013-02-22 MED ORDER — VITAMIN B-1 100 MG PO TABS
100.0000 mg | ORAL_TABLET | Freq: Every day | ORAL | Status: DC
  Filled 2013-02-22: qty 1

## 2013-02-22 MED ORDER — WHITE PETROLATUM GEL
Status: AC
  Administered 2013-02-23: 0.2
  Filled 2013-02-22: qty 5

## 2013-02-22 MED ORDER — PANTOPRAZOLE SODIUM 40 MG IV SOLR
40.0000 mg | Freq: Every day | INTRAVENOUS | Status: DC
  Administered 2013-02-22 – 2013-02-24 (×3): 40 mg via INTRAVENOUS
  Filled 2013-02-22 (×3): qty 40

## 2013-02-22 MED ORDER — METOPROLOL TARTRATE 1 MG/ML IV SOLN
INTRAVENOUS | Status: AC
  Filled 2013-02-22: qty 5

## 2013-02-22 MED ORDER — POTASSIUM CHLORIDE 10 MEQ/100ML IV SOLN
10.0000 meq | INTRAVENOUS | Status: AC
  Administered 2013-02-22 (×2): 10 meq via INTRAVENOUS
  Filled 2013-02-22: qty 200

## 2013-02-22 MED ORDER — THIAMINE HCL 100 MG/ML IJ SOLN: 100.0000 mg | Freq: Every day | INTRAMUSCULAR | Status: DC

## 2013-02-22 MED ORDER — THIAMINE HCL 100 MG/ML IJ SOLN
100.0000 mg | Freq: Every day | INTRAMUSCULAR | Status: DC
  Administered 2013-02-22 – 2013-02-24 (×3): 100 mg via INTRAVENOUS
  Filled 2013-02-22 (×4): qty 1

## 2013-02-22 MED ORDER — ADULT MULTIVITAMIN W/MINERALS CH
1.0000 | ORAL_TABLET | Freq: Every day | ORAL | Status: DC
  Administered 2013-02-24 – 2013-02-27 (×4): 1 via ORAL
  Filled 2013-02-22 (×6): qty 1

## 2013-02-22 NOTE — Progress Notes (Signed)
SLP Cancellation Note  Patient Details Name: Jerry [name] MRN: [id number] DOB: 1924/07/17   Cancelled treatment:       Reason Eval/Treat Not Completed: Patient's level of consciousness; pt not sufficiently alert for participation in therapy.  Will attempt f/u next date.   Blenda Mounts [name] 02/22/2013, 3:31 PM

## 2013-02-22 NOTE — Progress Notes (Signed)
Subjective: Patient resting in bed, lethargic, but given Ativan last night by nursing staff.  Objective: Vital signs in last 24 hours: Filed Vitals:   02/22/13 0400 02/22/13 0500 02/22/13 0600 02/22/13 0700  BP: 126/66 131/61 150/71 127/107  Pulse: 78 79 84 75  Temp: 97.6 F (36.4 C)     TempSrc: Oral     Resp: 16 16 16 17   Height:      Weight:      SpO2: 92% 97% 97% 99%    Intake/Output from previous day: 05/30 0701 - 05/31 0700 In: 1570 [P.O.:220; I.V.:1150; IV Piggyback:200] Out: 930 [Urine:930] Intake/Output this shift:    Physical Exam:  None opening eyes to voice. No speech.  CBC  Recent Labs  02/20/13 0525 02/20/13 1449 02/21/13 0425  WBC 8.9  --  11.9*  HGB 9.7* 10.4* 10.1*  HCT 27.8* 29.8* 28.4*  PLT 158  --  160   BMET  Recent Labs  02/21/13 0425 02/21/13 2252  NA 135 135  K 3.4* 3.5  CL 100 100  CO2 24 23  GLUCOSE 135* 122*  BUN 9 13  CREATININE 0.77 0.80  CALCIUM 8.7 8.5    Assessment/Plan: Continue supportive care. Have encouraged nurses to try to avoid sedation.   Hewitt Shorts, MD 02/22/2013, 9:04 AM

## 2013-02-22 NOTE — Progress Notes (Signed)
Trauma Service Note  Subjective: Patietn very lethargic this AM, but got some Ativan last night.  Has been progressively getting more alert throughout the day.  Objective: Vital signs in last 24 hours: Temp:  [97.6 F (36.4 C)-99.7 F (37.6 C)] 97.8 F (36.6 C) (05/31 0800) Pulse Rate:  [43-96] 80 (05/31 1100) Resp:  [16-23] 21 (05/31 1100) BP: (108-158)/(50-107) 135/50 mmHg (05/31 1100) SpO2:  [91 %-100 %] 100 % (05/31 1100) Last BM Date: 02/21/13  Intake/Output from previous day: 05/30 0701 - 05/31 0700 In: 1570 [P.O.:220; I.V.:1150; IV Piggyback:200] Out: 930 [Urine:930] Intake/Output this shift: Total I/O In: 200 [I.V.:200] Out: 290 [Urine:290]  General: No acute distress.  Sleepy  Lungs: Clear  Abd: Benign.  Not taking much orally.  Extremities: No changes.  Elbow X-rays negative  Neuro: Lethargic.  Moves all fours.  No focal deficits  Lab Results: CBC   Recent Labs  02/20/13 0525 02/20/13 1449 02/21/13 0425  WBC 8.9  --  11.9*  HGB 9.7* 10.4* 10.1*  HCT 27.8* 29.8* 28.4*  PLT 158  --  160   BMET  Recent Labs  02/21/13 0425 02/21/13 2252  NA 135 135  K 3.4* 3.5  CL 100 100  CO2 24 23  GLUCOSE 135* 122*  BUN 9 13  CREATININE 0.77 0.80  CALCIUM 8.7 8.5   PT/INR No results found for this basename: LABPROT, INR,  in the last 72 hours ABG No results found for this basename: PHART, PCO2, PO2, HCO3,  in the last 72 hours  Studies/Results: Dg Elbow 2 Views Left  02/21/2013   *RADIOLOGY REPORT*  Clinical Data: Open wound posterior elbow.  LEFT ELBOW - 2 VIEW  Comparison: None.  Findings: There is some gas in the soft tissues over the olecranon. No radiopaque foreign body or bony destructive change is identified.  There is no joint effusion.  No fracture.  IMPRESSION: Findings compatible with an open wound of the olecranon without evidence of osteomyelitis.  Negative for foreign body.   Original Report Authenticated By: Holley Dexter, M.D.     Anti-infectives: Anti-infectives   None      Assessment/Plan: s/p  Continue to watch closely. Maybe repeat head CT on Monday. No other changes  LOS: 3 days   Marta Lamas. Gae Bon, MD, FACS [phone] Trauma Surgeon 02/22/2013

## 2013-02-23 LAB — URINE CULTURE

## 2013-02-23 MED ORDER — METOPROLOL TARTRATE 1 MG/ML IV SOLN
5.0000 mg | Freq: Four times a day (QID) | INTRAVENOUS | Status: DC
  Administered 2013-02-23 – 2013-02-25 (×7): 5 mg via INTRAVENOUS
  Filled 2013-02-23 (×12): qty 5

## 2013-02-23 MED ORDER — CIPROFLOXACIN IN D5W 400 MG/200ML IV SOLN
400.0000 mg | Freq: Two times a day (BID) | INTRAVENOUS | Status: DC
  Administered 2013-02-23 – 2013-02-24 (×4): 400 mg via INTRAVENOUS
  Filled 2013-02-23 (×6): qty 200

## 2013-02-23 NOTE — Progress Notes (Signed)
Patient ID: Jerry [name], male   DOB: Oct 02, 1923, 79 y.o.   MRN: [id number] Trauma Service Note  Subjective: Sleepy but arousable, follows commands but does go back to sleep quickly.  Objective: Vital signs in last 24 hours: Temp:  [97.4 F (36.3 C)-98.6 F (37 C)] 98.6 F (37 C) (06/01 0400) Pulse Rate:  [68-113] 84 (06/01 0700) Resp:  [13-24] 15 (06/01 0700) BP: (124-145)/(42-107) 143/57 mmHg (06/01 0700) SpO2:  [96 %-100 %] 99 % (06/01 0700) Last BM Date: 02/21/13  Intake/Output from previous day: 05/31 0701 - 06/01 0700 In: 1200 [I.V.:1200] Out: 1955 [Urine:1955] Intake/Output this shift:    General: No acute distress.  Sleepy Lungs: Clear Abd: soft, nontender. Extremities: dressing on left elbow Neuro: Lethargic.  Moves all fours.  No focal deficits  Lab Results: CBC   Recent Labs  02/20/13 1449 02/21/13 0425  WBC  --  11.9*  HGB 10.4* 10.1*  HCT 29.8* 28.4*  PLT  --  160   BMET  Recent Labs  02/21/13 0425 02/21/13 2252  NA 135 135  K 3.4* 3.5  CL 100 100  CO2 24 23  GLUCOSE 135* 122*  BUN 9 13  CREATININE 0.77 0.80  CALCIUM 8.7 8.5   PT/INR No results found for this basename: LABPROT, INR,  in the last 72 hours ABG No results found for this basename: PHART, PCO2, PO2, HCO3,  in the last 72 hours  Studies/Results: Dg Elbow 2 Views Left  02/21/2013   *RADIOLOGY REPORT*  Clinical Data: Open wound posterior elbow.  LEFT ELBOW - 2 VIEW  Comparison: None.  Findings: There is some gas in the soft tissues over the olecranon. No radiopaque foreign body or bony destructive change is identified.  There is no joint effusion.  No fracture.  IMPRESSION: Findings compatible with an open wound of the olecranon without evidence of osteomyelitis.  Negative for foreign body.   Original Report Authenticated By: Holley Dexter, M.D.    Anti-infectives: Anti-infectives   None      Assessment/Plan: [facility]  TBI w/SAH  Multiple skin tears/lacs  Afib    Continue to watch closely. Maybe repeat head CT on Monday. No other changes  LOS: 4 days   Quinnlyn Hearns 8:00 AM 02/23/2013

## 2013-02-23 NOTE — Progress Notes (Signed)
SLP Cancellation Note  Patient Details Name: Jerry [name] MRN: [id number] DOB: November 12, 1923   Cancelled treatment:   Attempt x1 for f/u for diet tolerance and diagnostic treatment  but not completed due to patient presenting with intermittent LOA.  ST to f/u on 02/24/13.   Moreen Fowler MS, CCC-SLP [phone] [facility] 02/23/2013, 3:08 PM

## 2013-02-23 NOTE — Progress Notes (Addendum)
Clinical Social Work Department CLINICAL SOCIAL WORK PLACEMENT NOTE 02/23/2013  Patient:  Jerry [name], Jerry [name]  Account Number:  [id number] Admit date:  02/19/2013  Clinical Social Worker:  Leron Croak, CLINICAL SOCIAL WORKER  Date/time:  02/23/2013 10:39 AM  Clinical Social Work is seeking post-discharge placement for this patient at the following level of care:   SKILLED NURSING   (*CSW will update this form in Epic as items are completed)   02/23/2013  Patient/family provided with [facility] Department of Clinical Social Work's list of facilities offering this level of care within the geographic area requested by the patient (or if unable, by the patient's family).  02/23/2013  Patient/family informed of their freedom to choose among providers that offer the needed level of care, that participate in Medicare, Medicaid or managed care program needed by the patient, have an available bed and are willing to accept the patient.  02/23/2013  Patient/family informed of [facility]' ownership interest in [facility], as well as of the fact that they are under no obligation to receive care at this facility.  PASARR submitted to EDS on 02/23/2013 PASARR number received from EDS on   FL2 transmitted to all facilities in geographic area requested by pt/family on  02/23/2013 FL2 transmitted to all facilities within larger geographic area on 02/23/2013  Patient informed that his/her managed care company has contracts with or will negotiate with  certain facilities, including the following:     Patient/family informed of bed offers received:  02/25/2013 Patient chooses bed at  [facility] Physician recommends and patient chooses bed at    Patient to be transferred to [facility] on 02/27/2013 (JS)  Patient to be transferred to facility by  Ambulance  The following physician request were entered in Epic:   Additional Comments:   Leron Croak,  [facility]  [phone]

## 2013-02-23 NOTE — Progress Notes (Signed)
Subjective: Patient resting in bed, somewhat more responsive, answering a few questions, but still very limited speech and not following commands.  Objective: Vital signs in last 24 hours: Filed Vitals:   02/23/13 0400 02/23/13 0500 02/23/13 0600 02/23/13 0700  BP: 132/57 145/73 133/63 143/57  Pulse: 84 113 86 84  Temp: 98.6 F (37 C)     TempSrc: Oral     Resp: 14 20 13 15   Height:      Weight:      SpO2: 97% 99% 98% 99%    Intake/Output from previous day: 05/31 0701 - 06/01 0700 In: 1200 [I.V.:1200] Out: 1955 [Urine:1955] Intake/Output this shift:    Physical Exam:  None opening eyes, not following commands, some limited speech.  CBC  Recent Labs  02/20/13 1449 02/21/13 0425  WBC  --  11.9*  HGB 10.4* 10.1*  HCT 29.8* 28.4*  PLT  --  160   BMET  Recent Labs  02/21/13 0425 02/21/13 2252  NA 135 135  K 3.4* 3.5  CL 100 100  CO2 24 23  GLUCOSE 135* 122*  BUN 9 13  CREATININE 0.77 0.80  CALCIUM 8.7 8.5    Assessment/Plan: For followup CT brain without and a.m.  Will check BMET in a.m.   Hewitt Shorts, MD 02/23/2013, 7:56 AM
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
## 2013-02-23 NOTE — Progress Notes (Signed)
Clinical Social Work Department BRIEF PSYCHOSOCIAL ASSESSMENT 02/23/2013  Patient:  Jerry [name], Jerry [name]     Account Number:  [id number]     Admit date:  02/19/2013  Clinical Social Worker:  Leron Croak, CLINICAL SOCIAL WORKER  Date/Time:  02/23/2013 10:07 AM  Referred by:  Physician  Date Referred:  02/23/2013 Referred for  SNF Placement   Other Referral:   Interview type:  Family Other interview type:   Phone assessment with Kinta Martis  [phone]    PSYCHOSOCIAL DATA Living Status:  WITH ADULT CHILDREN Admitted from facility:   Level of care:   Primary support name:  Aurel Nguyen   [phone] Primary support relationship to patient:  CHILD, ADULT Degree of support available:   Pt has good support from children.    CURRENT CONCERNS Current Concerns  Post-Acute Placement   Other Concerns:    SOCIAL WORK ASSESSMENT / PLAN CSW met with Pt nurse for dispostiion. Pt is unable to communicate his desires for SNF at this time. CSW then attempted to contact [name]ravec and left a message about purpose for call/consult. CSW then contact Delena Serve for permission to do SNF search as a backup for placement if Pt were unable to participate in CIR rehab. Pt's son is agreeable to SNF search, however is hopeful that his dad can participate in CIR. CSW was given permission to search in the [facility] area for SNF placement. Pt family has not preference for SNF at this time.   Assessment/plan status:  Information/Referral to [facility] Other assessment/ plan:   Information/referral to community resources:   CSW will provide Pt family with a listing of SNF facilities upon request. Family would like to wait and see if Pt goes to CIR or requires SNF.    PATIENT'S/FAMILY'S RESPONSE TO PLAN OF CARE: Pt's son was appreciative for assistance with SNF workup and still remains hopeful for Pt recovery.      Leron Croak, LCSWA [facility] Emergency Dept.  [phone]

## 2013-02-24 ENCOUNTER — Encounter (HOSPITAL_COMMUNITY): Payer: Self-pay | Admitting: Radiology

## 2013-02-24 ENCOUNTER — Inpatient Hospital Stay (HOSPITAL_COMMUNITY): Payer: Medicare Other

## 2013-02-24 LAB — BASIC METABOLIC PANEL
BUN: 11 mg/dL (ref 6–23)
CO2: 23 mEq/L (ref 19–32)
Calcium: 8.4 mg/dL (ref 8.4–10.5)
Chloride: 100 mEq/L (ref 96–112)
Creatinine, Ser: 0.67 mg/dL (ref 0.50–1.35)
GFR calc Af Amer: >90 mL/min (ref >90)
GFR calc non Af Amer: 85 mL/min — ABNORMAL LOW (ref >90)
Glucose, Bld: 98 mg/dL (ref 70–99)
Potassium: 3.1 mEq/L — ABNORMAL LOW (ref 3.5–5.1)
Sodium: 136 mEq/L (ref 135–145)

## 2013-02-24 LAB — CBC WITH DIFFERENTIAL/PLATELET
Basophils Absolute: 0 10*3/uL (ref 0.0–0.1)
Basophils Relative: 0 % (ref 0–1)
Eosinophils Absolute: 0.1 10*3/uL (ref 0.0–0.7)
MCH: 30.4 pg (ref 26.0–34.0)
MCHC: 34.9 g/dL (ref 30.0–36.0)
Neutro Abs: 5.9 10*3/uL (ref 1.7–7.7)
Neutrophils Relative %: 70 % (ref 43–77)
Platelets: 247 10*3/uL (ref 150–400)
RDW: 13.2 % (ref 11.5–15.5)

## 2013-02-24 MED ORDER — POTASSIUM CHLORIDE CRYS ER 20 MEQ PO TBCR
20.0000 meq | EXTENDED_RELEASE_TABLET | Freq: Three times a day (TID) | ORAL | Status: AC
  Administered 2013-02-24 (×3): 20 meq via ORAL
  Filled 2013-02-24 (×3): qty 1
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
## 2013-02-24 NOTE — Progress Notes (Signed)
Patient ID: Jerry [name], male   DOB: 1924-03-23, 79 y.o.   MRN: [id number] Patient is awake alert appropriate oriented x2 was all extremities well except the left arm is in a sling with restricted movement of the left shoulder. No headache or nausea vomiting complaints this morning. Continue rehabilitation

## 2013-02-24 NOTE — Progress Notes (Signed)
Rehab admissions - Evaluated for possible admission.  I spoke with [name]'s wife by phone.  She tells me that patient is living with Chanetta [facility], but that [name] cannot care for patient after a short rehab stay.  Family are leaning towards SNF placement and they would like [facility] in [place] if possible.  [name]'s wife will share information with the other family.  Call me for questions.  #[phone]

## 2013-02-24 NOTE — Progress Notes (Signed)
Chaplain visited with pt and pt's daughter while rounding in 3100. Pt alert and sitting in chair. Chaplain provided emotional support and encouraging words for pt and daughter. Pt's daughter is a Producer, television/film/video. Pt thanked me for coming.

## 2013-02-24 NOTE — Progress Notes (Signed)
PT Cancellation Note  Patient Details Name: Jerry [name] MRN: [id number] DOB: [birth year]   Cancelled Treatment:    Reason Eval/Treat Not Completed: Patient at procedure or test/unavailable  Pt headed to swallow study now.  PT to check back later as time allows.     Rollene Rotunda Keyshun Elpers, PT, DPT [phone]   02/24/2013, 11:09 AM
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
## 2013-02-25 DIAGNOSIS — N390 Urinary tract infection, site not specified: Secondary | ICD-10-CM

## 2013-02-25 DIAGNOSIS — D62 Acute posthemorrhagic anemia: Secondary | ICD-10-CM

## 2013-02-25 LAB — BASIC METABOLIC PANEL
BUN: 14 mg/dL (ref 6–23)
Chloride: 100 mEq/L (ref 96–112)
GFR calc Af Amer: >90 mL/min (ref >90)
GFR calc non Af Amer: 81 mL/min — ABNORMAL LOW (ref >90)
Potassium: 3.8 mEq/L (ref 3.5–5.1)

## 2013-02-25 MED ORDER — PANTOPRAZOLE SODIUM 40 MG PO TBEC
40.0000 mg | DELAYED_RELEASE_TABLET | Freq: Every day | ORAL | Status: DC
  Administered 2013-02-25 – 2013-02-27 (×3): 40 mg via ORAL
  Filled 2013-02-25 (×3): qty 1

## 2013-02-25 MED ORDER — BIOTENE DRY MOUTH MT LIQD
15.0000 mL | Freq: Two times a day (BID) | OROMUCOSAL | Status: DC
  Administered 2013-02-25 – 2013-02-27 (×5): 15 mL via OROMUCOSAL

## 2013-02-25 MED ORDER — METOPROLOL TARTRATE 100 MG PO TABS
100.0000 mg | ORAL_TABLET | Freq: Two times a day (BID) | ORAL | Status: DC
  Administered 2013-02-25 (×2): 100 mg via ORAL
  Filled 2013-02-25 (×4): qty 1

## 2013-02-25 MED ORDER — VITAMIN B-1 100 MG PO TABS
100.0000 mg | ORAL_TABLET | Freq: Every day | ORAL | Status: DC
  Administered 2013-02-25 – 2013-02-27 (×3): 100 mg via ORAL
  Filled 2013-02-25 (×3): qty 1

## 2013-02-25 MED ORDER — CIPROFLOXACIN HCL 500 MG PO TABS
500.0000 mg | ORAL_TABLET | Freq: Two times a day (BID) | ORAL | Status: DC
  Administered 2013-02-25 – 2013-02-27 (×5): 500 mg via ORAL
  Filled 2013-02-25 (×7): qty 1

## 2013-02-25 NOTE — Progress Notes (Signed)
Agree with above 

## 2013-02-25 NOTE — Progress Notes (Signed)
Speech Language Pathology Dysphagia and Cognitive Treatment Patient Details Name: Jerry [name] MRN: [id number] DOB: 15-Nov-1923 Today's Date: 02/25/2013 Time: 4098-1191 SLP Time Calculation (min): 27 min  Assessment / Plan / Recommendation Clinical Impression  Treatment focused on both facilitation of cognitive recovery and dysphagia. SLP provided moderate-max verbal and visual cueing for sustained attention to basic functional self feeding task, with patient requiring cues to continue eating in between each bite/sip. Significant oral holding, left sided buccal pocketing, and intermittent wet vocal remains. SLP provided moderate cues for throat clear and dry swallow which was effective today to clear. Additional cueing (max) for alternation of dry with moist pureed solids aided in clearing of oral cavity. Aspiration risk continues to be high. Will continue to f/u. Adjusted cognitive linguistic goals as well in order to meet new needs.     Diet Recommendation  Continue with Current Diet: Dysphagia 1 (puree);Thin liquid    SLP Plan Continue with current plan of care   Pertinent Vitals/Pain None    Swallowing Goals  SLP Swallowing Goals Patient will utilize recommended strategies during swallow to increase swallowing safety with: Moderate assistance Swallow Study Goal #2 - Progress: Progressing toward goal  General Temperature Spikes Noted: No Respiratory Status: Room air Behavior/Cognition: Alert;Cooperative;Pleasant mood Oral Cavity - Dentition: Dentures, top;Dentures, bottom Patient Positioning: Upright in chair  Oral Cavity - Oral Hygiene Does patient have any of the following "at risk" factors?: Other - dysphagia Patient is AT RISK - Oral Care Protocol followed (see row info): Yes   Dysphagia Treatment Treatment focused on: Skilled observation of diet tolerance;Facilitation of oral phase;Utilization of compensatory strategies;Facilitation of pharyngeal phase Treatment  Methods/Modalities: Skilled observation;Differential diagnosis Patient observed directly with PO's: Yes Type of PO's observed: Dysphagia 1 (puree);Thin liquids Feeding: Able to feed self Liquids provided via: Cup Oral Phase Signs & Symptoms: Left pocketing;Anterior loss/spillage;Prolonged mastication;Prolonged bolus formation;Prolonged oral phase Pharyngeal Phase Signs & Symptoms: Wet vocal quality Type of cueing: Verbal;Tactile;Visual Amount of cueing: Maximal   GO   Jerry Lango MA, CCC-SLP [phone]   Jerry [name] 02/25/2013, 9:19 AM

## 2013-02-25 NOTE — Clinical Social Work Note (Signed)
Clinical Social Worker met with patient family at bedside to offer support and continue discussion regarding patient needs at discharge.  Patient family provided with list of facilities and plan to look into options today.  Patient family preference is for [facility], however due to liability concerns no bed offer has been extended at this time - patient family aware.  Patient family very understanding and realistic.  Patient family has requested extended search to [facility] - CSW has initiated.  CSW remains available for support and to facilitate patient discharge needs once medically ready.  Macario Golds, [place] [phone]

## 2013-02-25 NOTE — Progress Notes (Signed)
Occupational Therapy Treatment Patient Details Name: Jerry [name] MRN: [id number] DOB: Sep 05, 1924 Today's Date: 02/25/2013 Time: 8295-6213 OT Time Calculation (min): 26 min  OT Assessment / Plan / Recommendation    Follow Up Recommendations  CIR                Plan Discharge plan remains appropriate    Precautions / Restrictions Precautions Precautions: Fall Precaution Comments: Lt distal clavicle fx - in sling Required Braces or Orthoses:  (cervical brace discontinued) Restrictions Weight Bearing Restrictions: No Other Position/Activity Restrictions: Sling LUE for comfort       ADL  Eating/Feeding: Performed;Moderate assistance;Other (comment) (R hand only) Where Assessed - Eating/Feeding: Chair Grooming: Performed;Wash/dry face;Minimal assistance;Other (comment) (R hand only) Toilet Transfer: Simulated;Other (comment);Maximimal assistance (bed to chair) Toilet Transfer Method: Sit to stand;Stand pivot      OT Goals ADL Goals Pt Will Perform Eating: with set-up;Supported;Sitting, chair ADL Goal: Eating - Progress: Goal set today ADL Goal: Grooming - Progress: Progressing toward goals  Visit Information  Last OT Received On: 02/25/13    Subjective Data  Subjective: I will get up for breakfast      Cognition  Cognition Arousal/Alertness: Awake/alert Overall Cognitive Status: Impaired/Different from baseline Following Commands: Follows one step commands with increased time    Mobility  Bed Mobility Bed Mobility: Supine to Sit;Sitting - Scoot to Edge of Bed Supine to Sit: 3: Mod assist Sitting - Scoot to Edge of Bed: 3: Mod assist Details for Bed Mobility Assistance: assist of trunk due to limited use of left upper extremity (in sling) and painful right hand (bandaged- pt reports palmar surface hurts).  Transfers Transfers: Sit to Stand;Stand to Sit Sit to Stand: With upper extremity assist;With armrests;From bed;From chair/3-in-1;2: Max assist Sit to  Stand: Patient Percentage: 70% Stand to Sit: Without upper extremity assist;With armrests;To chair/3-in-1;2: Max assist          End of Session OT - End of Session Activity Tolerance: Patient tolerated treatment well Patient left: in chair;Other (comment) (with SLP present)  GO     Alba Cory 02/25/2013, 9:39 AM

## 2013-02-25 NOTE — Progress Notes (Signed)
Patient received to room 3302 from 3100. Max. Assist transfer into our bed. Vitals obtained, patient experiencing pain 9/10. Will medicate with Morphine 2mg . Patient settled and oriented to unit. Bed alarm on.

## 2013-02-25 NOTE — Progress Notes (Signed)
Adjusting Medications IV --> PO  Patient is receiving Protonix, Cipro, and Thiamine by the IV route.  Pt meets the P & T approved criteria for changing to oral administration.  - No GI bleeding  - Tolerating an oral or per tube diet  - Taking other oral or per tube medications.  - Afebrile x 24 hours  Will change accordingly per P& T policy.  Protonix 40mg  PO daily. Cipro 500 mg PO BID. Thiamine 100mg  PO daily.  MD, please address changing scheduled Metoprolol from IV to PO route.  Thank you. [employer], Pharm.D., BCPS Clinical Pharmacist Pager [phone]
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
## 2013-02-26 ENCOUNTER — Inpatient Hospital Stay (HOSPITAL_COMMUNITY): Payer: Medicare Other

## 2013-02-26 MED ORDER — METOPROLOL TARTRATE 50 MG PO TABS
50.0000 mg | ORAL_TABLET | Freq: Two times a day (BID) | ORAL | Status: DC
  Administered 2013-02-26 – 2013-02-27 (×3): 50 mg via ORAL
  Filled 2013-02-26 (×4): qty 1
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
## 2013-02-26 NOTE — Progress Notes (Signed)
Patient ID: Jerry [name], male   DOB: [birth year], 79 y.o.   MRN: [id number] Patient is awake alert appears to be at his neurologic baseline   exam: Awake alert oriented x3 moves his right upper to 70 both looks to me as well he appears to have decreased movement in the left upper extremity area is also very likely to be pain limited with the left shoulder injury and clavicle fracture. However he is not given the very much effort in grip strength or biceps function.  Will followup head CT as well as C-spine CT to rule out any occult cervical spine displacement or extension of his contusion in his right temporal lobe  Continue to work on physical outpatient therapy as tolerated

## 2013-02-26 NOTE — Progress Notes (Signed)
Occupational Therapy Treatment Patient Details Name: Jerry [name] MRN: [id number] DOB: Jan 23, 1924 Today's Date: 02/26/2013 Time: 4098-1191 OT Time Calculation (min): 19 min  OT Assessment / Plan / Recommendation Comments on Treatment Session Pt functional level declining with each session. MD is aware and ordering a repeat head CT and c-spine films.  Today pt neglecting LUE and requiring increased level of assist with transfers.  Continue to recommend CIR to progress rehab.    Follow Up Recommendations  CIR    Barriers to Discharge       Equipment Recommendations  3 in 1 bedside comode    Recommendations for Other Services Rehab consult  Frequency Min 2X/week   Plan Discharge plan remains appropriate    Precautions / Restrictions Precautions Precautions: Fall Precaution Comments: Lt distal clavicle fx - in sling Required Braces or Orthoses:  (cervical brace discontinued) Restrictions Other Position/Activity Restrictions: Sling LUE for comfort   Pertinent Vitals/Pain See vitals    ADL  Upper Body Dressing: Performed;Maximal assistance Where Assessed - Upper Body Dressing: Supported sitting Lower Body Dressing: Performed;+1 Total assistance (donned socks) Where Assessed - Lower Body Dressing: Supported sitting Toilet Transfer: Simulated;+2 Total assistance Toilet Transfer: Patient Percentage: 50% Statistician Method: Surveyor, minerals:  (bed<>chair) Equipment Used: Gait belt Transfers/Ambulation Related to ADLs: +2 total assist for SPT from bed to chair. ADL Comments: Pt demo'in inattention to LUE today.  Requires verbal cues and max assist to reposition LUE. When standing with RW, pt unable to keep LUE on RW but lets it drop to his side.  Pt unable to correctly read clock on wall or when clock brought closer to him.    OT Diagnosis:    OT Problem List:   OT Treatment Interventions:     OT Goals ADL Goals Pt Will Transfer to Toilet: with  supervision;Ambulation;with DME;Comfort height toilet ADL Goal: Toilet Transfer - Progress: Not progressing  Visit Information  Last OT Received On: 02/26/13 Assistance Needed: +2 PT/OT Co-Evaluation/Treatment: Yes    Subjective Data      Prior Functioning       Cognition  Cognition Arousal/Alertness: Lethargic Behavior During Therapy: WFL for tasks assessed/performed Overall Cognitive Status: Impaired/Different from baseline Area of Impairment: Attention;Following commands;Awareness;Problem solving Orientation Level: Disoriented to;Time;Person Current Attention Level: Sustained Memory: Decreased short-term memory Following Commands: Follows one step commands with increased time Safety/Judgement: Decreased awareness of deficits Awareness: Intellectual Problem Solving: Slow processing;Difficulty sequencing;Requires verbal cues;Requires tactile cues General Comments: Pt seemed slow to process even basic information given to him.   Rancho Levels of Cognitive Functioning Rancho Los Amigos Scales of Cognitive Functioning: Confused/appropriate    Mobility  Bed Mobility Bed Mobility: Supine to Sit;Sitting - Scoot to Edge of Bed Supine to Sit: 1: +2 Total assist Supine to Sit: Patient Percentage: 60% Sitting - Scoot to Edge of Bed: 1: +2 Total assist Sitting - Scoot to Edge of Bed: Patient Percentage: 60% Details for Bed Mobility Assistance: +2 assist to support trunk and LEs OOB.  Pt not using LUE at all for bed mobility. Transfers Transfers: Sit to Stand;Stand to Sit Sit to Stand: 1: +2 Total assist;From bed;With upper extremity assist Sit to Stand: Patient Percentage: 50% Stand to Sit: 1: +2 Total assist;To chair/3-in-1;To bed;With upper extremity assist Stand to Sit: Patient Percentage: 50% Details for Transfer Assistance: Pt stood x2 trials from elevated bed. Assist for midline orientation and to facilitate upright trunk position.  Pt stood first time with RW but was unable  to grip RW with LUE and also could not keep LUE on RW.  On second trial (without RW) pt then able to transfer to chair on left.    Exercises      Balance Balance Balance Assessed: Yes Static Sitting Balance Static Sitting - Balance Support: Right upper extremity supported;Feet supported Static Sitting - Level of Assistance: 5: Stand by assistance;3: Mod assist Static Sitting - Comment/# of Minutes: Initially requiring mod assist for balance due to right side lean.  Pt then able to maintain balance with close supervision. Static Standing Balance Static Standing - Balance Support: Bilateral upper extremity supported Static Standing - Level of Assistance: 1: +2 Total assist Static Standing - Comment/# of Minutes: Pt  =50% with assist for balance and steadying due to posterior lean and LEs supported on bed.   End of Session OT - End of Session Equipment Utilized During Treatment: Gait belt Activity Tolerance: Patient limited by fatigue Patient left: in chair;with call bell/phone within reach Nurse Communication: Mobility status  GO    02/26/2013 Cipriano Mile OTR/L Pager [phone] Office [phone]  Cipriano Mile 02/26/2013, 11:19 AM

## 2013-02-26 NOTE — Clinical Social Work Note (Signed)
Clinical Social Worker continuing to follow patient and family for support and discharge planning needs.  Patient family has chosen top 4 facilities in this order, [address], [facility], [facility], and [facility].  CSW has contacted [facility] who is considering patient at this time.  [facility] has extended bed offer for several days.  CSW has updated family and is available to facilitate patient discharge needs once medically ready.  Macario Golds, [place] [phone]
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
## 2013-02-27 LAB — CBC
HCT: 28.2 % — ABNORMAL LOW (ref 39.0–52.0)
Hemoglobin: 9.8 g/dL — ABNORMAL LOW (ref 13.0–17.0)
MCHC: 34.8 g/dL (ref 30.0–36.0)
MCV: 87.6 fL (ref 78.0–100.0)
RDW: 13.2 % (ref 11.5–15.5)

## 2013-02-27 LAB — BASIC METABOLIC PANEL
BUN: 14 mg/dL (ref 6–23)
Creatinine, Ser: 0.79 mg/dL (ref 0.50–1.35)
GFR calc Af Amer: >90 mL/min (ref >90)
GFR calc non Af Amer: 79 mL/min — ABNORMAL LOW (ref >90)
Glucose, Bld: 101 mg/dL — ABNORMAL HIGH (ref 70–99)

## 2013-02-27 MED ORDER — BIOTENE DRY MOUTH MT LIQD: 15.0000 mL | Freq: Two times a day (BID) | OROMUCOSAL | Status: DC

## 2013-02-27 MED ORDER — NYSTATIN 100000 UNIT/ML MT SUSP
5.0000 mL | Freq: Four times a day (QID) | OROMUCOSAL | Status: DC
  Administered 2013-02-27 (×2): 500000 [IU] via ORAL
  Filled 2013-02-27 (×4): qty 5

## 2013-02-27 MED ORDER — METOPROLOL TARTRATE 50 MG PO TABS: 50.0000 mg | ORAL_TABLET | Freq: Two times a day (BID) | ORAL | Status: DC

## 2013-02-27 MED ORDER — BACITRACIN ZINC 500 UNIT/GM EX OINT: TOPICAL_OINTMENT | Freq: Two times a day (BID) | CUTANEOUS | Status: DC

## 2013-02-27 MED ORDER — NYSTATIN 100000 UNIT/ML MT SUSP: 5.0000 mL | Freq: Four times a day (QID) | OROMUCOSAL | Status: DC

## 2013-02-27 MED ORDER — CIPROFLOXACIN HCL 500 MG PO TABS: 500.0000 mg | ORAL_TABLET | Freq: Two times a day (BID) | ORAL | Status: AC

## 2013-02-27 MED ORDER — ADULT MULTIVITAMIN W/MINERALS CH: 1.0000 | ORAL_TABLET | Freq: Every day | ORAL | Status: DC
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
## 2013-02-27 NOTE — Clinical Social Work Note (Signed)
Clinical Social Worker facilitated patient discharge including contacting patient family and facility to confirm patient discharge plans.  Clinical information faxed to facility and family agreeable with plan.  CSW arranged ambulance transport via [facility] to [facility].  RN to call report prior to discharge.  Clinical Social Worker will sign off for now as social work intervention is no longer needed. Please consult us again if new need arises.  Jesse Emad Brechtel, LCSW [phone] 

## 2013-02-27 NOTE — Progress Notes (Signed)
The patient can go to SNF today.  He is not receive any specific medical care requiring inpatient therapy.  Marta Lamas. Gae Bon, MD, FACS [phone] Trauma Surgeon

## 2013-02-27 NOTE — Progress Notes (Signed)
Speech Language Pathology Dysphagia Treatment Patient Details Name: Jerry [name] MRN: [id number] DOB: 1924/01/26 Today's Date: 02/27/2013 Time: 9562-1308 SLP Time Calculation (min): 20 min  Assessment / Plan / Recommendation Clinical Impression  Treatment focused on cognition and dysphagia. SLP offered max to hand over hand assist for self feeding, problem solving with functional ADL, attending to left visual field. Arousal continues to be impaired. Wet vocal quality persists throughout session, pt required max verbal cues for throat clear and second swallow which were minimally effective due to poor pt effort. Pt may continue current diet, given evidence of tolerance over several days, though he will need f/u with SLP at next level of care.     Diet Recommendation  Continue with Current Diet: Dysphagia 1 (puree);Thin liquid    SLP Plan Continue with current plan of care   Pertinent Vitals/Pain NA   Swallowing Goals  SLP Swallowing Goals Patient will utilize recommended strategies during swallow to increase swallowing safety with: Moderate assistance Swallow Study Goal #2 - Progress: Progressing toward goal Goal #3: Patient will consume trials of upgraded solids with intact ability to orally transit bolus and initiate a swallow with moderate slp cueing for use of compensatory strategies.  Swallow Study Goal #3 - Progress: Progressing toward goal  General Temperature Spikes Noted: No Respiratory Status: Room air Behavior/Cognition: Alert;Cooperative;Pleasant mood Oral Cavity - Dentition: Dentures, top;Dentures, bottom Patient Positioning: Upright in bed  Oral Cavity - Oral Hygiene Does patient have any of the following "at risk" factors?: Other - dysphagia Patient is AT RISK - Oral Care Protocol followed (see row info): Yes   Dysphagia Treatment Treatment focused on: Skilled observation of diet tolerance;Facilitation of oral phase;Utilization of compensatory strategies;Facilitation  of pharyngeal phase Treatment Methods/Modalities: Skilled observation;Differential diagnosis Patient observed directly with PO's: Yes Type of PO's observed: Dysphagia 1 (puree);Thin liquids Feeding: Needs assist Liquids provided via: Cup;Straw Oral Phase Signs & Symptoms: Prolonged oral phase;Prolonged bolus formation Pharyngeal Phase Signs & Symptoms: Wet vocal quality Type of cueing: Verbal;Tactile;Visual Amount of cueing: Maximal   GO    Harlon Ditty, MA CCC-SLP [phone]  Claudine Mouton 02/27/2013, 2:38 PM

## 2013-02-27 NOTE — Progress Notes (Signed)
Pt report called to [facility], PIV removed, VSS, meds current. Family aware of transfer to SNF and will be present on arrival there.
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
## 2013-02-28 ENCOUNTER — Non-Acute Institutional Stay (SKILLED_NURSING_FACILITY): Payer: Medicare Other | Admitting: Internal Medicine

## 2013-02-28 DIAGNOSIS — S0181XS Laceration without foreign body of other part of head, sequela: Secondary | ICD-10-CM

## 2013-02-28 DIAGNOSIS — S42002S Fracture of unspecified part of left clavicle, sequela: Secondary | ICD-10-CM

## 2013-02-28 DIAGNOSIS — S42309S Unspecified fracture of shaft of humerus, unspecified arm, sequela: Secondary | ICD-10-CM

## 2013-02-28 DIAGNOSIS — S069X9S Unspecified intracranial injury with loss of consciousness of unspecified duration, sequela: Secondary | ICD-10-CM

## 2013-02-28 DIAGNOSIS — I10 Essential (primary) hypertension: Secondary | ICD-10-CM

## 2013-02-28 DIAGNOSIS — S066X9S Traumatic subarachnoid hemorrhage with loss of consciousness of unspecified duration, sequela: Secondary | ICD-10-CM

## 2013-02-28 DIAGNOSIS — IMO0002 Reserved for concepts with insufficient information to code with codable children: Secondary | ICD-10-CM

## 2013-02-28 DIAGNOSIS — R531 Weakness: Secondary | ICD-10-CM

## 2013-02-28 DIAGNOSIS — I4891 Unspecified atrial fibrillation: Secondary | ICD-10-CM

## 2013-02-28 DIAGNOSIS — N390 Urinary tract infection, site not specified: Secondary | ICD-10-CM | POA: Insufficient documentation

## 2013-02-28 DIAGNOSIS — M6281 Muscle weakness (generalized): Secondary | ICD-10-CM
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
## 2013-04-08 ENCOUNTER — Non-Acute Institutional Stay (SKILLED_NURSING_FACILITY): Payer: Medicare Other | Admitting: Internal Medicine

## 2013-04-08 ENCOUNTER — Encounter: Payer: Self-pay | Admitting: Internal Medicine

## 2013-04-08 DIAGNOSIS — I4891 Unspecified atrial fibrillation: Secondary | ICD-10-CM

## 2013-04-08 DIAGNOSIS — R531 Weakness: Secondary | ICD-10-CM

## 2013-04-08 DIAGNOSIS — R634 Abnormal weight loss: Secondary | ICD-10-CM

## 2013-04-08 DIAGNOSIS — I10 Essential (primary) hypertension: Secondary | ICD-10-CM

## 2013-04-08 DIAGNOSIS — M6281 Muscle weakness (generalized): Secondary | ICD-10-CM
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
## 2013-06-10 ENCOUNTER — Encounter: Payer: Self-pay | Admitting: Internal Medicine

## 2013-06-10 ENCOUNTER — Non-Acute Institutional Stay (SKILLED_NURSING_FACILITY): Payer: Medicare Other | Admitting: Internal Medicine

## 2013-06-10 DIAGNOSIS — S42002S Fracture of unspecified part of left clavicle, sequela: Secondary | ICD-10-CM

## 2013-06-10 DIAGNOSIS — S42309S Unspecified fracture of shaft of humerus, unspecified arm, sequela: Secondary | ICD-10-CM

## 2013-06-10 DIAGNOSIS — S066X9S Traumatic subarachnoid hemorrhage with loss of consciousness of unspecified duration, sequela: Secondary | ICD-10-CM

## 2013-06-10 DIAGNOSIS — I4891 Unspecified atrial fibrillation: Secondary | ICD-10-CM

## 2013-06-10 DIAGNOSIS — IMO0002 Reserved for concepts with insufficient information to code with codable children: Secondary | ICD-10-CM

## 2013-06-10 DIAGNOSIS — S069X9S Unspecified intracranial injury with loss of consciousness of unspecified duration, sequela: Secondary | ICD-10-CM

## 2013-06-10 DIAGNOSIS — R531 Weakness: Secondary | ICD-10-CM

## 2013-06-10 DIAGNOSIS — S069XAS Unspecified intracranial injury with loss of consciousness status unknown, sequela: Secondary | ICD-10-CM

## 2013-06-10 DIAGNOSIS — M6281 Muscle weakness (generalized): Secondary | ICD-10-CM

## 2013-06-10 DIAGNOSIS — S0181XS Laceration without foreign body of other part of head, sequela: Secondary | ICD-10-CM

## 2013-06-10 DIAGNOSIS — I10 Essential (primary) hypertension: Secondary | ICD-10-CM

## 2013-06-10 DIAGNOSIS — R635 Abnormal weight gain: Secondary | ICD-10-CM
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
## 2013-06-15 NOTE — Progress Notes (Signed)
Patient ID: Jerry [name], male   DOB: 02/24/1924, 79 y.o.   MRN: [id number] Labs: 03/07/13:   Wbc 5.2, h/h 10.3/30.4, plts 368;  Na 135, K 4.1, BUN 13, cr 0.93, glucose 96 Alb 3, AP 108, AST 39, ALT 59, Ca 8.5  I ordered a cbc, cmp next draw due to increased transaminases on tylenol and pradaxa;  Not to get more than 3g tylenol in 24 hours.

## 2013-06-17 ENCOUNTER — Non-Acute Institutional Stay (SKILLED_NURSING_FACILITY): Payer: Medicare Other | Admitting: Internal Medicine

## 2013-06-17 DIAGNOSIS — S42002S Fracture of unspecified part of left clavicle, sequela: Secondary | ICD-10-CM

## 2013-06-17 DIAGNOSIS — S066X9S Traumatic subarachnoid hemorrhage with loss of consciousness of unspecified duration, sequela: Secondary | ICD-10-CM

## 2013-06-17 DIAGNOSIS — I10 Essential (primary) hypertension: Secondary | ICD-10-CM

## 2013-06-17 DIAGNOSIS — M6281 Muscle weakness (generalized): Secondary | ICD-10-CM

## 2013-06-17 DIAGNOSIS — S069XAS Unspecified intracranial injury with loss of consciousness status unknown, sequela: Secondary | ICD-10-CM

## 2013-06-17 DIAGNOSIS — IMO0002 Reserved for concepts with insufficient information to code with codable children: Secondary | ICD-10-CM

## 2013-06-17 DIAGNOSIS — S069X9S Unspecified intracranial injury with loss of consciousness of unspecified duration, sequela: Secondary | ICD-10-CM

## 2013-06-17 DIAGNOSIS — R531 Weakness: Secondary | ICD-10-CM

## 2013-06-17 DIAGNOSIS — S42309S Unspecified fracture of shaft of humerus, unspecified arm, sequela: Secondary | ICD-10-CM

## 2013-06-17 DIAGNOSIS — S0181XS Laceration without foreign body of other part of head, sequela: Secondary | ICD-10-CM
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
## 2013-06-18 ENCOUNTER — Encounter: Payer: Self-pay | Admitting: Internal Medicine

## 2013-08-19 ENCOUNTER — Emergency Department (HOSPITAL_COMMUNITY)
Admission: EM | Admit: 2013-08-19 | Discharge: 2013-08-20 | Disposition: A | Discharge status: Home / Self Care | Payer: Medicare Other | Attending: Emergency Medicine | Admitting: Emergency Medicine

## 2013-08-19 ENCOUNTER — Emergency Department (HOSPITAL_COMMUNITY): Payer: Medicare Other

## 2013-08-19 ENCOUNTER — Encounter (HOSPITAL_COMMUNITY): Payer: Self-pay | Admitting: Emergency Medicine

## 2013-08-19 DIAGNOSIS — Z87828 Personal history of other (healed) physical injury and trauma: Secondary | ICD-10-CM | POA: Diagnosis not present

## 2013-08-19 DIAGNOSIS — F1010 Alcohol abuse, uncomplicated: Secondary | ICD-10-CM | POA: Diagnosis not present

## 2013-08-19 DIAGNOSIS — S42209A Unspecified fracture of upper end of unspecified humerus, initial encounter for closed fracture: Secondary | ICD-10-CM | POA: Insufficient documentation

## 2013-08-19 DIAGNOSIS — Y939 Activity, unspecified: Secondary | ICD-10-CM | POA: Diagnosis not present

## 2013-08-19 DIAGNOSIS — S42201A Unspecified fracture of upper end of right humerus, initial encounter for closed fracture: Secondary | ICD-10-CM

## 2013-08-19 DIAGNOSIS — Z8719 Personal history of other diseases of the digestive system: Secondary | ICD-10-CM | POA: Insufficient documentation

## 2013-08-19 DIAGNOSIS — Y929 Unspecified place or not applicable: Secondary | ICD-10-CM | POA: Insufficient documentation

## 2013-08-19 DIAGNOSIS — R296 Repeated falls: Secondary | ICD-10-CM | POA: Insufficient documentation

## 2013-08-19 DIAGNOSIS — Y92009 Unspecified place in unspecified non-institutional (private) residence as the place of occurrence of the external cause: Secondary | ICD-10-CM | POA: Insufficient documentation

## 2013-08-19 DIAGNOSIS — D696 Thrombocytopenia, unspecified: Secondary | ICD-10-CM

## 2013-08-19 DIAGNOSIS — M129 Arthropathy, unspecified: Secondary | ICD-10-CM | POA: Diagnosis not present

## 2013-08-19 DIAGNOSIS — I10 Essential (primary) hypertension: Secondary | ICD-10-CM | POA: Insufficient documentation

## 2013-08-19 DIAGNOSIS — E871 Hypo-osmolality and hyponatremia: Secondary | ICD-10-CM | POA: Diagnosis not present

## 2013-08-19 DIAGNOSIS — Z79899 Other long term (current) drug therapy: Secondary | ICD-10-CM | POA: Insufficient documentation

## 2013-08-19 DIAGNOSIS — W19XXXA Unspecified fall, initial encounter: Secondary | ICD-10-CM

## 2013-08-19 DIAGNOSIS — S0003XA Contusion of scalp, initial encounter: Secondary | ICD-10-CM | POA: Diagnosis not present

## 2013-08-19 DIAGNOSIS — Z880 Allergy status to penicillin: Secondary | ICD-10-CM | POA: Insufficient documentation

## 2013-08-19 DIAGNOSIS — S4980XA Other specified injuries of shoulder and upper arm, unspecified arm, initial encounter: Secondary | ICD-10-CM | POA: Diagnosis present

## 2013-08-19 DIAGNOSIS — F10929 Alcohol use, unspecified with intoxication, unspecified: Secondary | ICD-10-CM

## 2013-08-19 NOTE — ED Notes (Signed)
Pt to ED via [facility] after reported having to much ETOH tonight and falling.  Pt with deformity to right shoulder but denies any pain.  On arrival to ED right arm in a sling. Pt denies any other injuries
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
## 2013-08-19 NOTE — ED Notes (Signed)
Ortho notified

## 2013-08-20 DIAGNOSIS — S42209A Unspecified fracture of upper end of unspecified humerus, initial encounter for closed fracture: Secondary | ICD-10-CM | POA: Diagnosis not present

## 2013-08-20 LAB — COMPREHENSIVE METABOLIC PANEL
ALT: 15 U/L (ref 0–53)
Alkaline Phosphatase: 70 U/L (ref 39–117)
CO2: 22 mEq/L (ref 19–32)
GFR calc Af Amer: 62 mL/min — ABNORMAL LOW (ref >90)
GFR calc non Af Amer: 54 mL/min — ABNORMAL LOW (ref >90)
Glucose, Bld: 112 mg/dL — ABNORMAL HIGH (ref 70–99)
Potassium: 4 mEq/L (ref 3.5–5.1)
Sodium: 129 mEq/L — ABNORMAL LOW (ref 135–145)

## 2013-08-20 LAB — CBC WITH DIFFERENTIAL/PLATELET
Eosinophils Relative: 1 % (ref 0–5)
HCT: 36.8 % — ABNORMAL LOW (ref 39.0–52.0)
Lymphocytes Relative: 21 % (ref 12–46)
Lymphs Abs: 1.6 10*3/uL (ref 0.7–4.0)
MCH: 31 pg (ref 26.0–34.0)
MCHC: 35.6 g/dL (ref 30.0–36.0)
Neutrophils Relative %: 66 % (ref 43–77)
RDW: 15.3 % (ref 11.5–15.5)

## 2013-08-20 MED ORDER — HYDROCODONE-ACETAMINOPHEN 5-325 MG PO TABS: 2.0000 | ORAL_TABLET | ORAL | Status: DC | PRN

## 2013-08-20 NOTE — ED Notes (Signed)
pts son in law Jerry [name] will come pick up pt

## 2013-08-20 NOTE — ED Notes (Signed)
Spoke with Jerry [name], daughter  She will be coming for him to take him home.

## 2013-08-20 NOTE — ED Notes (Signed)
Asissted to the BR.  Into wheelchair, ride here to go home.

## 2013-09-12 ENCOUNTER — Encounter (HOSPITAL_COMMUNITY): Payer: Self-pay | Admitting: *Deleted

## 2013-09-12 ENCOUNTER — Other Ambulatory Visit (HOSPITAL_COMMUNITY): Payer: Self-pay | Admitting: Orthopaedic Surgery

## 2013-09-12 ENCOUNTER — Other Ambulatory Visit (HOSPITAL_COMMUNITY): Payer: Self-pay | Admitting: *Deleted

## 2013-09-12 NOTE — Progress Notes (Signed)
Delane Daniel's phone # is [phone]

## 2013-09-12 NOTE — Progress Notes (Signed)
Spoke with patient about his health hx, he is not a very good historian. Talked with his daughter-in-law, Delane Saldierna and gave her the pre-op instructions. She states she and her husband (son) will be with patient the day of surgery.

## 2013-09-14 MED ORDER — CLINDAMYCIN PHOSPHATE 900 MG/50ML IV SOLN
900.0000 mg | INTRAVENOUS | Status: AC
  Administered 2013-09-15: 900 mg via INTRAVENOUS
  Filled 2013-09-14 (×2): qty 50

## 2013-09-15 ENCOUNTER — Encounter (HOSPITAL_COMMUNITY): Payer: Self-pay | Admitting: Pharmacy Technician

## 2013-09-15 ENCOUNTER — Ambulatory Visit (HOSPITAL_COMMUNITY): Payer: Medicare Other

## 2013-09-15 ENCOUNTER — Encounter (HOSPITAL_COMMUNITY): Payer: Self-pay | Admitting: Certified Registered Nurse Anesthetist

## 2013-09-15 ENCOUNTER — Encounter (HOSPITAL_COMMUNITY)
Admission: RE | Disposition: A | Discharge status: Skilled Nursing Facility | Payer: Self-pay | Source: Ambulatory Visit | Attending: Orthopaedic Surgery

## 2013-09-15 ENCOUNTER — Inpatient Hospital Stay (HOSPITAL_COMMUNITY)
Admission: RE | Admit: 2013-09-15 | Discharge: 2013-09-17 | DRG: 493 | Disposition: A | Discharge status: Skilled Nursing Facility | Payer: Medicare Other | Source: Ambulatory Visit | Attending: Orthopaedic Surgery | Admitting: Orthopaedic Surgery

## 2013-09-15 ENCOUNTER — Encounter (HOSPITAL_COMMUNITY): Payer: Medicare Other | Admitting: Anesthesiology

## 2013-09-15 ENCOUNTER — Ambulatory Visit (HOSPITAL_COMMUNITY): Payer: Medicare Other | Admitting: Anesthesiology

## 2013-09-15 DIAGNOSIS — I10 Essential (primary) hypertension: Secondary | ICD-10-CM | POA: Diagnosis present

## 2013-09-15 DIAGNOSIS — S42309A Unspecified fracture of shaft of humerus, unspecified arm, initial encounter for closed fracture: Principal | ICD-10-CM | POA: Diagnosis present

## 2013-09-15 DIAGNOSIS — Z7901 Long term (current) use of anticoagulants: Secondary | ICD-10-CM

## 2013-09-15 DIAGNOSIS — S68118A Complete traumatic metacarpophalangeal amputation of other finger, initial encounter: Secondary | ICD-10-CM

## 2013-09-15 DIAGNOSIS — Z87891 Personal history of nicotine dependence: Secondary | ICD-10-CM

## 2013-09-15 DIAGNOSIS — Z887 Allergy status to serum and vaccine status: Secondary | ICD-10-CM

## 2013-09-15 DIAGNOSIS — N289 Disorder of kidney and ureter, unspecified: Secondary | ICD-10-CM | POA: Clinically undetermined

## 2013-09-15 DIAGNOSIS — Z8673 Personal history of transient ischemic attack (TIA), and cerebral infarction without residual deficits: Secondary | ICD-10-CM

## 2013-09-15 DIAGNOSIS — R339 Retention of urine, unspecified: Secondary | ICD-10-CM | POA: Diagnosis not present

## 2013-09-15 DIAGNOSIS — Z87820 Personal history of traumatic brain injury: Secondary | ICD-10-CM

## 2013-09-15 DIAGNOSIS — S066X9A Traumatic subarachnoid hemorrhage with loss of consciousness of unspecified duration, initial encounter: Secondary | ICD-10-CM

## 2013-09-15 DIAGNOSIS — X58XXXA Exposure to other specified factors, initial encounter: Secondary | ICD-10-CM | POA: Diagnosis present

## 2013-09-15 DIAGNOSIS — Z79899 Other long term (current) drug therapy: Secondary | ICD-10-CM

## 2013-09-15 DIAGNOSIS — E860 Dehydration: Secondary | ICD-10-CM | POA: Diagnosis present

## 2013-09-15 DIAGNOSIS — N179 Acute kidney failure, unspecified: Secondary | ICD-10-CM | POA: Diagnosis not present

## 2013-09-15 DIAGNOSIS — S42301A Unspecified fracture of shaft of humerus, right arm, initial encounter for closed fracture: Secondary | ICD-10-CM

## 2013-09-15 DIAGNOSIS — I4891 Unspecified atrial fibrillation: Secondary | ICD-10-CM | POA: Diagnosis present

## 2013-09-15 DIAGNOSIS — D62 Acute posthemorrhagic anemia: Secondary | ICD-10-CM | POA: Diagnosis not present

## 2013-09-15 DIAGNOSIS — Z880 Allergy status to penicillin: Secondary | ICD-10-CM

## 2013-09-15 DIAGNOSIS — L408 Other psoriasis: Secondary | ICD-10-CM | POA: Diagnosis present

## 2013-09-15 HISTORY — PX: ORIF HUMERUS FRACTURE: SHX2126

## 2013-09-15 HISTORY — DX: Cerebral infarction, unspecified: I63.9

## 2013-09-15 HISTORY — DX: Pneumonia, unspecified organism: J18.9

## 2013-09-15 HISTORY — DX: Unspecified asthma, uncomplicated: J45.909

## 2013-09-15 HISTORY — DX: Unspecified fracture of shaft of humerus, unspecified arm, initial encounter for closed fracture: S42.309A

## 2013-09-15 HISTORY — DX: Unspecified injury of head, initial encounter: S09.90XA

## 2013-09-15 HISTORY — DX: Cardiac arrhythmia, unspecified: I49.9

## 2013-09-15 HISTORY — DX: Personal history of other diseases of the digestive system: Z87.19

## 2013-09-15 LAB — CBC
MCH: 30.5 pg (ref 26.0–34.0)
MCHC: 33.3 g/dL (ref 30.0–36.0)
MCV: 91.6 fL (ref 78.0–100.0)
Platelets: 172 10*3/uL (ref 150–400)
RBC: 3.93 MIL/uL — ABNORMAL LOW (ref 4.22–5.81)
RDW: 14.1 % (ref 11.5–15.5)

## 2013-09-15 LAB — APTT: aPTT: 62 seconds — ABNORMAL HIGH (ref 24–37)

## 2013-09-15 LAB — COMPREHENSIVE METABOLIC PANEL
AST: 11 U/L (ref 0–37)
CO2: 26 mEq/L (ref 19–32)
Calcium: 8.9 mg/dL (ref 8.4–10.5)
Chloride: 103 mEq/L (ref 96–112)
Creatinine, Ser: 1.48 mg/dL — ABNORMAL HIGH (ref 0.50–1.35)
GFR calc non Af Amer: 41 mL/min — ABNORMAL LOW (ref >90)
Sodium: 138 mEq/L (ref 135–145)
Total Bilirubin: 0.8 mg/dL (ref 0.3–1.2)
Total Protein: 6.6 g/dL (ref 6.0–8.3)

## 2013-09-15 LAB — PROTIME-INR: INR: 1.49 (ref 0.00–1.49)

## 2013-09-15 SURGERY — OPEN REDUCTION INTERNAL FIXATION (ORIF) HUMERAL SHAFT FRACTURE
Anesthesia: General | Site: Arm Upper | Laterality: Right

## 2013-09-15 MED ORDER — METOPROLOL TARTRATE 50 MG PO TABS
50.0000 mg | ORAL_TABLET | Freq: Once | ORAL | Status: AC
  Administered 2013-09-15: 50 mg via ORAL

## 2013-09-15 MED ORDER — METHOCARBAMOL 500 MG PO TABS
500.0000 mg | ORAL_TABLET | Freq: Four times a day (QID) | ORAL | Status: DC | PRN
  Administered 2013-09-16: 500 mg via ORAL
  Filled 2013-09-15: qty 1

## 2013-09-15 MED ORDER — POLYETHYLENE GLYCOL 3350 17 G PO PACK: 17.0000 g | PACK | Freq: Every day | ORAL | Status: DC | PRN

## 2013-09-15 MED ORDER — METOCLOPRAMIDE HCL 5 MG/ML IJ SOLN: 5.0000 mg | Freq: Three times a day (TID) | INTRAMUSCULAR | Status: DC | PRN

## 2013-09-15 MED ORDER — DIPHENHYDRAMINE HCL 12.5 MG/5ML PO ELIX: 25.0000 mg | ORAL_SOLUTION | ORAL | Status: DC | PRN

## 2013-09-15 MED ORDER — ONDANSETRON HCL 4 MG/2ML IJ SOLN: 4.0000 mg | Freq: Four times a day (QID) | INTRAMUSCULAR | Status: DC | PRN

## 2013-09-15 MED ORDER — MEPERIDINE HCL 25 MG/ML IJ SOLN: 6.2500 mg | INTRAMUSCULAR | Status: DC | PRN

## 2013-09-15 MED ORDER — MORPHINE SULFATE 2 MG/ML IJ SOLN: 1.0000 mg | INTRAMUSCULAR | Status: DC | PRN

## 2013-09-15 MED ORDER — ONDANSETRON HCL 4 MG PO TABS: 4.0000 mg | ORAL_TABLET | Freq: Four times a day (QID) | ORAL | Status: DC | PRN

## 2013-09-15 MED ORDER — HYDRALAZINE HCL 20 MG/ML IJ SOLN: 5.0000 mg | Freq: Four times a day (QID) | INTRAMUSCULAR | Status: DC | PRN

## 2013-09-15 MED ORDER — HYDROMORPHONE HCL PF 1 MG/ML IJ SOLN
0.2500 mg | INTRAMUSCULAR | Status: DC | PRN
  Administered 2013-09-15 (×3): 0.5 mg via INTRAVENOUS

## 2013-09-15 MED ORDER — SODIUM CHLORIDE 0.9 % IV SOLN
INTRAVENOUS | Status: DC
  Administered 2013-09-15 – 2013-09-16 (×2): via INTRAVENOUS

## 2013-09-15 MED ORDER — OXYCODONE HCL 5 MG PO TABS
5.0000 mg | ORAL_TABLET | ORAL | Status: DC | PRN
  Administered 2013-09-15 – 2013-09-16 (×5): 5 mg via ORAL
  Administered 2013-09-17: 10 mg via ORAL
  Filled 2013-09-15 (×2): qty 1
  Filled 2013-09-15 (×2): qty 2
  Filled 2013-09-15 (×2): qty 1

## 2013-09-15 MED ORDER — LIDOCAINE HCL (CARDIAC) 20 MG/ML IV SOLN
INTRAVENOUS | Status: DC | PRN
  Administered 2013-09-15: 60 mg via INTRAVENOUS

## 2013-09-15 MED ORDER — MAGNESIUM CITRATE PO SOLN: 1.0000 | Freq: Once | ORAL | Status: DC | PRN

## 2013-09-15 MED ORDER — GLYCOPYRROLATE 0.2 MG/ML IJ SOLN
INTRAMUSCULAR | Status: DC | PRN
  Administered 2013-09-15: 0.4 mg via INTRAVENOUS

## 2013-09-15 MED ORDER — ALBUMIN HUMAN 5 % IV SOLN
INTRAVENOUS | Status: DC | PRN
  Administered 2013-09-15: 12:00:00 via INTRAVENOUS

## 2013-09-15 MED ORDER — METHOCARBAMOL 100 MG/ML IJ SOLN
500.0000 mg | Freq: Four times a day (QID) | INTRAVENOUS | Status: DC | PRN
  Filled 2013-09-15: qty 5

## 2013-09-15 MED ORDER — ROCURONIUM BROMIDE 100 MG/10ML IV SOLN
INTRAVENOUS | Status: DC | PRN
  Administered 2013-09-15: 40 mg via INTRAVENOUS
  Administered 2013-09-15: 10 mg via INTRAVENOUS

## 2013-09-15 MED ORDER — 0.9 % SODIUM CHLORIDE (POUR BTL) OPTIME
TOPICAL | Status: DC | PRN
  Administered 2013-09-15 (×2): 1000 mL

## 2013-09-15 MED ORDER — LACTATED RINGERS IV SOLN
INTRAVENOUS | Status: DC | PRN
  Administered 2013-09-15 (×2): via INTRAVENOUS

## 2013-09-15 MED ORDER — SORBITOL 70 % SOLN: 30.0000 mL | Freq: Every day | Status: DC | PRN

## 2013-09-15 MED ORDER — OXYCODONE HCL 5 MG PO TABS: 5.0000 mg | ORAL_TABLET | Freq: Once | ORAL | Status: DC | PRN

## 2013-09-15 MED ORDER — METOPROLOL TARTRATE 50 MG PO TABS
50.0000 mg | ORAL_TABLET | Freq: Two times a day (BID) | ORAL | Status: DC
Start: 2013-09-15 — End: 2013-09-17
  Administered 2013-09-15 – 2013-09-17 (×4): 50 mg via ORAL
  Filled 2013-09-15 (×5): qty 1

## 2013-09-15 MED ORDER — SENNA 8.6 MG PO TABS
1.0000 | ORAL_TABLET | Freq: Two times a day (BID) | ORAL | Status: DC
  Administered 2013-09-15 – 2013-09-17 (×4): 8.6 mg via ORAL
  Filled 2013-09-15 (×5): qty 1

## 2013-09-15 MED ORDER — POLYETHYLENE GLYCOL 3350 17 G PO PACK
17.0000 g | PACK | Freq: Every day | ORAL | Status: DC
  Administered 2013-09-15 – 2013-09-17 (×3): 17 g via ORAL
  Filled 2013-09-15 (×3): qty 1

## 2013-09-15 MED ORDER — HYDROMORPHONE HCL PF 1 MG/ML IJ SOLN
INTRAMUSCULAR | Status: AC
  Filled 2013-09-15: qty 1

## 2013-09-15 MED ORDER — CLINDAMYCIN PHOSPHATE 600 MG/50ML IV SOLN
600.0000 mg | Freq: Four times a day (QID) | INTRAVENOUS | Status: AC
  Administered 2013-09-15 – 2013-09-16 (×3): 600 mg via INTRAVENOUS
  Filled 2013-09-15 (×3): qty 50

## 2013-09-15 MED ORDER — METOPROLOL TARTRATE 50 MG PO TABS
ORAL_TABLET | ORAL | Status: AC
  Filled 2013-09-15: qty 1

## 2013-09-15 MED ORDER — DOXAZOSIN MESYLATE 8 MG PO TABS
8.0000 mg | ORAL_TABLET | Freq: Every day | ORAL | Status: DC
  Administered 2013-09-15 – 2013-09-16 (×2): 8 mg via ORAL
  Filled 2013-09-15 (×3): qty 1

## 2013-09-15 MED ORDER — ONDANSETRON HCL 4 MG/2ML IJ SOLN
INTRAMUSCULAR | Status: DC | PRN
  Administered 2013-09-15: 4 mg via INTRAVENOUS

## 2013-09-15 MED ORDER — PROPOFOL 10 MG/ML IV BOLUS
INTRAVENOUS | Status: DC | PRN
  Administered 2013-09-15: 160 mg via INTRAVENOUS

## 2013-09-15 MED ORDER — METOCLOPRAMIDE HCL 5 MG PO TABS
5.0000 mg | ORAL_TABLET | Freq: Three times a day (TID) | ORAL | Status: DC | PRN
  Filled 2013-09-15: qty 2

## 2013-09-15 MED ORDER — HYDROCERIN EX CREA
TOPICAL_CREAM | Freq: Two times a day (BID) | CUTANEOUS | Status: DC
  Administered 2013-09-15 – 2013-09-17 (×5): via TOPICAL
  Filled 2013-09-15: qty 113

## 2013-09-15 MED ORDER — OXYCODONE HCL 5 MG/5ML PO SOLN: 5.0000 mg | Freq: Once | ORAL | Status: DC | PRN

## 2013-09-15 MED ORDER — FENTANYL CITRATE 0.05 MG/ML IJ SOLN
INTRAMUSCULAR | Status: DC | PRN
  Administered 2013-09-15: 100 ug via INTRAVENOUS
  Administered 2013-09-15 (×3): 50 ug via INTRAVENOUS

## 2013-09-15 MED ORDER — LACTATED RINGERS IV SOLN
INTRAVENOUS | Status: DC
  Administered 2013-09-15: 09:00:00 via INTRAVENOUS

## 2013-09-15 MED ORDER — NEOSTIGMINE METHYLSULFATE 1 MG/ML IJ SOLN
INTRAMUSCULAR | Status: DC | PRN
  Administered 2013-09-15: 3 mg via INTRAVENOUS

## 2013-09-15 MED ORDER — ONDANSETRON HCL 4 MG/2ML IJ SOLN
4.0000 mg | Freq: Once | INTRAMUSCULAR | Status: DC | PRN
Start: 2013-09-15 — End: 2013-09-15

## 2013-09-15 SURGICAL SUPPLY — 70 items
APL SKNCLS STERI-STRIP NONHPOA (GAUZE/BANDAGES/DRESSINGS)
BANDAGE ELASTIC 3 VELCRO ST LF (GAUZE/BANDAGES/DRESSINGS) IMPLANT
BANDAGE ELASTIC 4 VELCRO ST LF (GAUZE/BANDAGES/DRESSINGS) ×2 IMPLANT
BANDAGE GAUZE ELAST BULKY 4 IN (GAUZE/BANDAGES/DRESSINGS) ×1 IMPLANT
BENZOIN TINCTURE PRP APPL 2/3 (GAUZE/BANDAGES/DRESSINGS) IMPLANT
BIT DRILL AO MATTA 3.2MX180M (BIT) IMPLANT
BLADE SURG 10 STRL SS (BLADE) IMPLANT
BLADE SURG ROTATE 9660 (MISCELLANEOUS) IMPLANT
BNDG CMPR 9X4 STRL LF SNTH (GAUZE/BANDAGES/DRESSINGS)
BNDG COHESIVE 4X5 TAN STRL (GAUZE/BANDAGES/DRESSINGS) ×1 IMPLANT
BNDG ESMARK 4X9 LF (GAUZE/BANDAGES/DRESSINGS) ×1 IMPLANT
CANISTER SUCTION 2500CC (MISCELLANEOUS) ×1 IMPLANT
CLEANER TIP ELECTROSURG 2X2 (MISCELLANEOUS) ×1 IMPLANT
CLOTH BEACON ORANGE TIMEOUT ST (SAFETY) ×1 IMPLANT
CORDS BIPOLAR (ELECTRODE) ×1 IMPLANT
COVER SURGICAL LIGHT HANDLE (MISCELLANEOUS) ×2 IMPLANT
CUFF TOURNIQUET SINGLE 18IN (TOURNIQUET CUFF) ×1 IMPLANT
CUFF TOURNIQUET SINGLE 24IN (TOURNIQUET CUFF) IMPLANT
DRAPE C-ARM 42X72 X-RAY (DRAPES) ×2 IMPLANT
DRAPE INCISE IOBAN 66X45 STRL (DRAPES) ×2 IMPLANT
DRAPE U-SHAPE 47X51 STRL (DRAPES) ×2 IMPLANT
DRILL BIT AO MATTA 3.2MX180M (BIT) ×2
DRSG TEGADERM 4X4.75 (GAUZE/BANDAGES/DRESSINGS) ×8 IMPLANT
ELECT CAUTERY BLADE 6.4 (BLADE) ×2 IMPLANT
ELECT REM PT RETURN 9FT ADLT (ELECTROSURGICAL) ×2
ELECTRODE REM PT RTRN 9FT ADLT (ELECTROSURGICAL) ×1 IMPLANT
EVACUATOR 1/8 PVC DRAIN (DRAIN) ×1 IMPLANT
FACESHIELD LNG OPTICON STERILE (SAFETY) ×1 IMPLANT
GAUZE XEROFORM 5X9 LF (GAUZE/BANDAGES/DRESSINGS) ×2 IMPLANT
GLOVE BIO SURGEON STRL SZ 6.5 (GLOVE) ×1 IMPLANT
GLOVE BIOGEL PI IND STRL 7.0 (GLOVE) IMPLANT
GLOVE BIOGEL PI INDICATOR 7.0 (GLOVE) ×1
GLOVE SURG SS PI 7.5 STRL IVOR (GLOVE) ×6 IMPLANT
GOWN PREVENTION PLUS LG XLONG (DISPOSABLE) IMPLANT
GOWN STRL NON-REIN LRG LVL3 (GOWN DISPOSABLE) ×4 IMPLANT
GOWN STRL REIN XL XLG (GOWN DISPOSABLE) ×1 IMPLANT
KIT BASIN OR (CUSTOM PROCEDURE TRAY) ×2 IMPLANT
KIT ROOM TURNOVER OR (KITS) ×2 IMPLANT
MANIFOLD NEPTUNE II (INSTRUMENTS) ×1 IMPLANT
NS IRRIG 1000ML POUR BTL (IV SOLUTION) ×3 IMPLANT
PACK ORTHO EXTREMITY (CUSTOM PROCEDURE TRAY) ×2 IMPLANT
PACK SHOULDER (CUSTOM PROCEDURE TRAY) ×1 IMPLANT
PAD ARMBOARD 7.5X6 YLW CONV (MISCELLANEOUS) ×3 IMPLANT
PAD CAST 4YDX4 CTTN HI CHSV (CAST SUPPLIES) ×1 IMPLANT
PADDING CAST COTTON 4X4 STRL (CAST SUPPLIES)
PLATE COMPRESSION 9H NARROW (Plate) ×1 IMPLANT
SCREW CORTEX ST MATTA 4.5X26MM (Screw) ×1 IMPLANT
SCREW CORTEX ST MATTA 4.5X28MM (Screw) ×2 IMPLANT
SCREW CORTEX ST MATTA 4.5X30MM (Screw) ×2 IMPLANT
SCREW CORTEX ST MATTA 4.5X34MM (Screw) ×1 IMPLANT
SCREW CORTEX ST MATTA 4.5X38MM (Screw) ×1 IMPLANT
SLING ARM FOAM STRAP LRG (SOFTGOODS) ×1 IMPLANT
SPONGE GAUZE 4X4 12PLY (GAUZE/BANDAGES/DRESSINGS) ×2 IMPLANT
SPONGE LAP 18X18 X RAY DECT (DISPOSABLE) ×4 IMPLANT
STAPLER VISISTAT 35W (STAPLE) ×2 IMPLANT
STRIP CLOSURE SKIN 1/2X4 (GAUZE/BANDAGES/DRESSINGS) IMPLANT
SUCTION FRAZIER TIP 10 FR DISP (SUCTIONS) ×2 IMPLANT
SUT ETHILON 3 0 PS 1 (SUTURE) ×2 IMPLANT
SUT PROLENE 3 0 PS 2 (SUTURE) ×3 IMPLANT
SUT VIC AB 0 CT1 27 (SUTURE) ×2
SUT VIC AB 0 CT1 27XBRD ANBCTR (SUTURE) ×2 IMPLANT
SUT VIC AB 2-0 CT1 27 (SUTURE) ×4
SUT VIC AB 2-0 CT1 TAPERPNT 27 (SUTURE) ×2 IMPLANT
SYR CONTROL 10ML LL (SYRINGE) IMPLANT
TOWEL OR 17X24 6PK STRL BLUE (TOWEL DISPOSABLE) IMPLANT
TOWEL OR 17X26 10 PK STRL BLUE (TOWEL DISPOSABLE) ×2 IMPLANT
TUBE CONNECTING 12X1/4 (SUCTIONS) ×2 IMPLANT
UNDERPAD 30X30 INCONTINENT (UNDERPADS AND DIAPERS) ×2 IMPLANT
WATER STERILE IRR 1000ML POUR (IV SOLUTION) ×1 IMPLANT
YANKAUER SUCT BULB TIP NO VENT (SUCTIONS) ×2 IMPLANT

## 2013-09-15 NOTE — H&P (Signed)
PREOPERATIVE H&P  Chief Complaint: Right humeral shaft fracture  HPI: Jerry [name] is a 79 y.o. male who presents for surgical treatment of Right humeral shaft fracture.  He denies any changes in medical history.  Past Medical History  Diagnosis Date  . Hypertension   . Arthritis   . Hernia   . Dysrhythmia     atrial fibrillation  . Head injury   . Humerus fracture     right  . Asthma     as a child  . Pneumonia   . Stroke     "light stroke"  . H/O hiatal hernia    Past Surgical History  Procedure Laterality Date  . Finger amputation Left     little   History   Social History  . Marital Status: Widowed    Spouse Name: N/A    Number of Children: N/A  . Years of Education: N/A   Social History Main Topics  . Smoking status: Never Smoker   . Smokeless tobacco: Former Neurosurgeon    Types: Chew  . Alcohol Use: Yes     Comment: heavy drinker  . Drug Use: None  . Sexual Activity: None   Other Topics Concern  . None   Social History Narrative  . None   History reviewed. No pertinent family history. Allergies  Allergen Reactions  . Penicillins Other (See Comments)    REACTION: unknown  . Tetanus Toxoids    Prior to Admission medications   Medication Sig Start Date End Date Taking? Authorizing Provider  HYDROcodone-acetaminophen (NORCO/VICODIN) 5-325 MG per tablet Take 2 tablets by mouth every 4 (four) hours as needed. 08/20/13  Yes Olivia Mackie, MD  acetaminophen (TYLENOL) 650 MG CR tablet Take 650 mg by mouth every 8 (eight) hours as needed for pain.    Historical Provider, MD  amLODipine (NORVASC) 10 MG tablet Take 10 mg by mouth daily.    Historical Provider, MD  doxazosin (CARDURA) 8 MG tablet Take 8 mg by mouth at bedtime.    Historical Provider, MD  metoprolol (LOPRESSOR) 50 MG tablet Take 1 tablet (50 mg total) by mouth every 12 (twelve) hours. 02/27/13   Ashok Norris, NP  Multiple Vitamin (MULTIVITAMIN WITH MINERALS) TABS Take 1 tablet by mouth daily. 02/27/13    Emina Riebock, NP     Positive ROS: All other systems have been reviewed and were otherwise negative with the exception of those mentioned in the HPI and as above.  Physical Exam: General: Alert, no acute distress Cardiovascular: No pedal edema Respiratory: No cyanosis, no use of accessory musculature GI: No organomegaly, abdomen is soft and non-tender Skin: No lesions in the area of chief complaint Neurologic: Sensation intact distally Psychiatric: Patient is competent for consent with normal mood and affect Lymphatic: No axillary or cervical lymphadenopathy  MUSCULOSKELETAL:   RUE in sarmiento, NVI  Assessment: Right humeral shaft fracture  Plan: Plan for Procedure(s): OPEN REDUCTION INTERNAL FIXATION (ORIF) RIGHT HUMERAL SHAFT FRACTURE  The risks benefits and alternatives were discussed with the patient including but not limited to the risks of nonoperative treatment, versus surgical intervention including infection, bleeding, nerve injury,  blood clots, cardiopulmonary complications, morbidity, mortality, among others, and they were willing to proceed.   Cheral Almas, MD   09/15/2013 7:59 AM

## 2013-09-15 NOTE — Anesthesia Postprocedure Evaluation (Signed)
  Anesthesia Post-op Note  Patient: Jerry [name]  Procedure(s) Performed: Procedure(s): OPEN REDUCTION INTERNAL FIXATION (ORIF) RIGHT HUMERAL SHAFT FRACTURE (Right)  Patient Location: PACU  Anesthesia Type:General  Level of Consciousness: awake, alert  and oriented  Airway and Oxygen Therapy: Patient Spontanous Breathing  Post-op Pain: mild  Post-op Assessment: Post-op Vital signs reviewed, Respiratory Function Stable, Patent Airway and Pain level controlled  Post-op Vital Signs: stable  Complications: No apparent anesthesia complications

## 2013-09-15 NOTE — Op Note (Signed)
Date of surgery: 09/15/2013  Preoperative diagnosis: Right humeral shaft fracture  Postoperative diagnosis: Same  Procedure. Open treatment of right humeral shaft fracture with plates and screws.  Implants: Stryker compression plate  Surgeon: Glee Arvin, M.D.  Anesthesia: Gen.  Estimated blood loss: 400 cc  Complications: None next  Condition to PACU: Stable  Indications for procedure: The patient is 79 year old gentleman who I attempted to treat the humeral fracture in a closed fashion with a Sarmiento brace. However the brace was poorly tolerated and the fracture was malreduced.  X-rays approximately 2 weeks after the initial injury showed unacceptable alignment and of the humeral fracture. Recommendations were to treat this fracture operatively especially since the patient uses a walker for ambulation. The risks, benefits, and alternatives to surgery were discussed with the patient and his family members and they all were in agreement to proceed with surgery.  Description of procedure: The patient was identified in the preoperative holding area. The operative site and procedure were confirmed with the patient and marked by the surgeon. He was brought back to the operating room. Pre-incisional Mbytes were given. A timeout was performed. General anesthesia was induced. His placed supine on the table. The right upper extremity was prepped and draped in standard sterile fashion. A anterolateral approach to the humerus was used. Blunt dissection was taken down to the level of the fascia. The biceps muscle belly was identified and retracted anteriorly. The brachialis muscle was then identified. The brachialis muscle was split in line with the incision. The humerus was then exposed. The fracture site was exposed and did show signs of early healing and callus formation. However the bone ends were four-part. Reduction using a tenaculum clamp was achieved. Orthogonal views on x-ray were taken to  confirm adequate reduction. We then sized a Stryker nine-hole compression plate. The plate was applied to the bone in a bridge fashion.  Distally blunt dissection was performed to ensure that the radial nerve was not entrapped were damaged. We did not encounter the radial nerve. Once the plate was placed and screws were tightened down final x-rays were taken.  1 L of normal saline was irrigated through the wound. Hemostasis was obtained. A medium Hemovac was placed in the wound bed. The wounds closed in layer fashion using 0 Vicryl for the subcutaneous fat and fascia 2-0 Vicryl for the subcutaneous layer and a running nylon suture for the skin. A sterile dressing was applied. The patient awoke from anesthesia uneventfully and transferred to the PACU.  Disposition: Patient will be admitted overnight for observation. We will follow the drain output. He will be weightbearing as tolerated to the right upper extremity. I will obtain a hospitalist consult for his elevated creatinine.  Mayra Reel, MD [facility] [phone] 12:58 PM

## 2013-09-15 NOTE — Transfer of Care (Signed)
Immediate Anesthesia Transfer of Care Note  Patient: Jerry [name]  Procedure(s) Performed: Procedure(s): OPEN REDUCTION INTERNAL FIXATION (ORIF) RIGHT HUMERAL SHAFT FRACTURE (Right)  Patient Location: PACU  Anesthesia Type:General  Level of Consciousness: awake and alert   Airway & Oxygen Therapy: Patient Spontanous Breathing and Patient connected to nasal cannula oxygen  Post-op Assessment: Report given to PACU RN and Post -op Vital signs reviewed and stable  Post vital signs: Reviewed and stable  Complications: No apparent anesthesia complications

## 2013-09-15 NOTE — Anesthesia Preprocedure Evaluation (Signed)
Anesthesia Evaluation  Patient identified by MRN, date of birth, ID band Patient awake    Reviewed: Allergy & Precautions, H&P , Patient's Chart, lab work & pertinent test results, reviewed documented beta blocker date and time   History of Anesthesia Complications Negative for: history of anesthetic complications  Airway Mallampati: II TM Distance: >3 FB Neck ROM: full    Dental   Pulmonary asthma , pneumonia -,  breath sounds clear to auscultation        Cardiovascular Exercise Tolerance: Good hypertension, + dysrhythmias Rhythm:regular Rate:Normal     Neuro/Psych CVA    GI/Hepatic hiatal hernia,   Endo/Other    Renal/GU      Musculoskeletal   Abdominal   Peds  Hematology   Anesthesia Other Findings   Reproductive/Obstetrics                           Anesthesia Physical Anesthesia Plan  ASA: III  Anesthesia Plan: General ETT   Post-op Pain Management:    Induction:   Airway Management Planned:   Additional Equipment:   Intra-op Plan:   Post-operative Plan:   Informed Consent: I have reviewed the patients History and Physical, chart, labs and discussed the procedure including the risks, benefits and alternatives for the proposed anesthesia with the patient or authorized representative who has indicated his/her understanding and acceptance.   Dental Advisory Given  Plan Discussed with: CRNA and Surgeon  Anesthesia Plan Comments:         Anesthesia Quick Evaluation

## 2013-09-15 NOTE — Preoperative (Signed)
Beta Blockers   Reason not to administer Beta Blockers:Not Applicable, took metoprolol this am 

## 2013-09-15 NOTE — Anesthesia Procedure Notes (Signed)
Procedure Name: Intubation Date/Time: 09/15/2013 10:52 AM Performed by: Margaree Mackintosh Pre-anesthesia Checklist: Patient identified, Timeout performed, Emergency Drugs available, Suction available and Patient being monitored Patient Re-evaluated:Patient Re-evaluated prior to inductionOxygen Delivery Method: Circle system utilized Preoxygenation: Pre-oxygenation with 100% oxygen Intubation Type: IV induction Ventilation: Mask ventilation without difficulty and Oral airway inserted - appropriate to patient size Laryngoscope Size: Mac and 3 Grade View: Grade I Tube type: Oral Tube size: 7.5 mm Number of attempts: 1 Airway Equipment and Method: Stylet Placement Confirmation: ETT inserted through vocal cords under direct vision,  positive ETCO2 and breath sounds checked- equal and bilateral Secured at: 22 cm Tube secured with: Tape Dental Injury: Teeth and Oropharynx as per pre-operative assessment

## 2013-09-15 NOTE — Progress Notes (Signed)
Dr. Roda Shutters notified of PT, PTT results at Dr. Edison Pace request. Dr. Roda Shutters states they are ok for surgery today.
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
## 2013-09-15 NOTE — Progress Notes (Signed)
Orthopedic Tech Progress Note Patient Details:  AYAANSH SMAIL Sep 22, 1924 [id number] Trapeze bar     Cammer, Mickie Bail 09/15/2013, 4:18 PM

## 2013-09-16 DIAGNOSIS — N289 Disorder of kidney and ureter, unspecified: Secondary | ICD-10-CM

## 2013-09-16 DIAGNOSIS — I10 Essential (primary) hypertension: Secondary | ICD-10-CM

## 2013-09-16 DIAGNOSIS — S42309A Unspecified fracture of shaft of humerus, unspecified arm, initial encounter for closed fracture: Secondary | ICD-10-CM

## 2013-09-16 DIAGNOSIS — I4891 Unspecified atrial fibrillation: Secondary | ICD-10-CM

## 2013-09-16 LAB — CBC
HCT: 27.2 % — ABNORMAL LOW (ref 39.0–52.0)
MCH: 30.4 pg (ref 26.0–34.0)
MCHC: 33.1 g/dL (ref 30.0–36.0)
Platelets: 137 10*3/uL — ABNORMAL LOW (ref 150–400)
RDW: 13.9 % (ref 11.5–15.5)
WBC: 5.8 10*3/uL (ref 4.0–10.5)

## 2013-09-16 LAB — COMPREHENSIVE METABOLIC PANEL
ALT: 5 U/L (ref 0–53)
AST: 10 U/L (ref 0–37)
Alkaline Phosphatase: 77 U/L (ref 39–117)
BUN: 15 mg/dL (ref 6–23)
CO2: 24 mEq/L (ref 19–32)
GFR calc Af Amer: 58 mL/min — ABNORMAL LOW (ref >90)
GFR calc non Af Amer: 50 mL/min — ABNORMAL LOW (ref >90)
Glucose, Bld: 112 mg/dL — ABNORMAL HIGH (ref 70–99)
Potassium: 4.6 mEq/L (ref 3.5–5.1)
Sodium: 135 mEq/L (ref 135–145)
Total Bilirubin: 0.8 mg/dL (ref 0.3–1.2)
Total Protein: 5.4 g/dL — ABNORMAL LOW (ref 6.0–8.3)

## 2013-09-16 MED ORDER — TAMSULOSIN HCL 0.4 MG PO CAPS
0.4000 mg | ORAL_CAPSULE | Freq: Every day | ORAL | Status: DC
  Administered 2013-09-16 – 2013-09-17 (×2): 0.4 mg via ORAL
  Filled 2013-09-16 (×2): qty 1

## 2013-09-16 MED ORDER — OXYCODONE HCL 5 MG PO TABS: 5.0000 mg | ORAL_TABLET | ORAL | Status: DC | PRN
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
## 2013-09-16 NOTE — Progress Notes (Signed)
   Subjective:  Patient reports pain as marked.   Objective:   VITALS:   Filed Vitals:   09/15/13 1430 09/15/13 1456 09/15/13 2039 09/16/13 0530  BP:  166/80 144/60 139/67  Pulse:  68 83 60  Temp: 97 F (36.1 C) 97 F (36.1 C) 97.5 F (36.4 C) 98.4 F (36.9 C)  TempSrc:      Resp:  18 18 18   Height:      Weight:      SpO2:  99% 97% 96%    Neurologically intact Neurovascular intact Sensation intact distally Intact pulses distally Dorsiflexion/Plantar flexion intact Incision: dressing C/D/I and moderate drainage No cellulitis present Compartment soft Radial nerve intact   Lab Results  Component Value Date   WBC 5.8 09/16/2013   HGB 9.0* 09/16/2013   HCT 27.2* 09/16/2013   MCV 91.9 09/16/2013   PLT 137* 09/16/2013     Assessment/Plan: 1 Day Post-Op   Problem List Items Addressed This Visit     Cardiovascular and Mediastinum   Atrial fibrillation - Primary (Chronic)   Relevant Medications      dabigatran (PRADAXA) 150 MG CAPS capsule      metoprolol (LOPRESSOR) tablet 50 mg (Completed)      metoprolol (LOPRESSOR) tablet 50 mg      doxazosin (CARDURA) tablet 8 mg      hydrALAZINE (APRESOLINE) injection 5 mg   HTN (hypertension) (Chronic)     Musculoskeletal and Integument   *Fracture of right humerus   Relevant Orders      Weight bearing as tolerated    Other Visit Diagnoses   Renal insufficiency           Regular diet Up with PT/OT DVT ppx - SCDs, ambulation Flomax started for urinary retention WBAT right upper extremity HVAC d/c'ed May resume pradaxa wed 12/24 Continue IVFs for ARF ARF per hospitalist - appreciate their assistance Pain control Discharge planning   Cheral Almas 09/16/2013, 8:16 AM [phone]

## 2013-09-16 NOTE — Progress Notes (Signed)
UR completed.  Patient changed to inpatient r/t requiring IVF @ 125cc and difficulty voiding.

## 2013-09-16 NOTE — Discharge Summary (Addendum)
Physician Discharge Summary  Patient ID: Jerry [name] MRN: [id number] DOB/AGE: [birth year] 77 y.o.  Admit date: 09/15/2013 Discharge date: 09/17/13  Admission Diagnoses:  Fracture of right humerus  Discharge Diagnoses:  Principal Problem:   Fracture of right humerus Active Problems:   Humerus fracture   Past Medical History  Diagnosis Date  . Hypertension   . Arthritis   . Hernia   . Dysrhythmia     atrial fibrillation  . Head injury   . Humerus fracture     right  . Asthma     as a child  . Pneumonia   . Stroke     "light stroke"  . H/O hiatal hernia     Surgeries: Procedure(s): OPEN REDUCTION INTERNAL FIXATION (ORIF) RIGHT HUMERAL SHAFT FRACTURE on 09/15/2013   Consultants (if any): internal medicine  Discharged Condition: Improved  Hospital Course: Jerry [name] is an 79 y.o. male who was admitted 09/15/2013 with a diagnosis of Fracture of right humerus and went to the operating room on 09/15/2013 and underwent the above named procedures.    He was given perioperative antibiotics:      Anti-infectives   Start     Dose/Rate Route Frequency Ordered Stop   09/15/13 1800  clindamycin (CLEOCIN) IVPB 600 mg     600 mg 100 mL/hr over 30 Minutes Intravenous Every 6 hours 09/15/13 1456 09/16/13 0531   09/15/13 0600  clindamycin (CLEOCIN) IVPB 900 mg     900 mg 100 mL/hr over 30 Minutes Intravenous On call to O.R. 09/14/13 1431 09/15/13 1105    .  He was given sequential compression devices, early ambulation for DVT prophylaxis.  He benefited maximally from the hospital stay and there were no complications.    Recent vital signs:  Filed Vitals:   09/16/13 0530  BP: 139/67  Pulse: 60  Temp: 98.4 F (36.9 C)  Resp: 18    Recent laboratory studies:  Lab Results  Component Value Date   HGB 9.0* 09/16/2013   HGB 12.0* 09/15/2013   HGB 13.1 08/20/2013   Lab Results  Component Value Date   WBC 5.8 09/16/2013   PLT 137* 09/16/2013   Lab Results    Component Value Date   INR 1.49 09/15/2013   Lab Results  Component Value Date   NA 135 09/16/2013   K 4.6 09/16/2013   CL 103 09/16/2013   CO2 24 09/16/2013   BUN 15 09/16/2013   CREATININE 1.24 09/16/2013   GLUCOSE 112* 09/16/2013    Discharge Medications:     Medication List         dabigatran 150 MG Caps capsule  Commonly known as:  PRADAXA  Take 150 mg by mouth 2 (two) times daily.     doxazosin 8 MG tablet  Commonly known as:  CARDURA  Take 8 mg by mouth at bedtime.     metoprolol 50 MG tablet  Commonly known as:  LOPRESSOR  Take 1 tablet (50 mg total) by mouth every 12 (twelve) hours.     oxyCODONE 5 MG immediate release tablet  Commonly known as:  Oxy IR/ROXICODONE  Take 1-3 tablets (5-15 mg total) by mouth every 4 (four) hours as needed.        Diagnostic Studies: Dg Chest 1 View  08/19/2013   CLINICAL DATA:  Right humerus fracture.  Pain.  EXAM: CHEST - 1 VIEW  COMPARISON:  02/19/2013  FINDINGS: Borderline heart size with normal pulmonary vascularity. No focal consolidation in the  lungs. No blunting of costophrenic angles. No pneumothorax. Visualized ribs appear intact. Oblique fracture of the right humeral shaft. 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  IMPRESSION: No evidence of active pulmonary disease. Acute fracture of the right humerus. 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   Electronically Signed   By: Burman Nieves M.D.   On: 08/19/2013 23:56   Dg Shoulder Right  08/19/2013   CLINICAL DATA:  Fall.  Right shoulder injury and pain.  EXAM: RIGHT SHOULDER - 2+ VIEW  COMPARISON:  None.  FINDINGS: An oblique fracture of the proximal humeral diaphysis is seen with Mild displacement and angulation. No other acute fractures identified. No evidence of shoulder dislocation.  IMPRESSION: Proximal right humeral diaphyseal fracture.   Electronically Signed   By: Myles Rosenthal M.D.   On: 08/19/2013 23:01   Ct Head Wo Contrast  08/20/2013    CLINICAL DATA:  Fall. Head and neck injury. Left scalp laceration. Previous traumatic intracranial hemorrhage.  EXAM: CT HEAD WITHOUT CONTRAST  CT CERVICAL SPINE WITHOUT CONTRAST  TECHNIQUE: Multidetector CT imaging of the head and cervical spine was performed following the standard protocol without intravenous contrast. Multiplanar CT image reconstructions of the cervical spine were also generated.  COMPARISON:  02/26/2013  FINDINGS: CT HEAD FINDINGS  There is no evidence of intracranial hemorrhage, brain edema, or other signs of acute infarction. There is no evidence of intracranial mass lesion or mass effect. No abnormal extraaxial fluid collections are identified.  Bilateral frontal and right temporal lobe encephalomalacia is seen at sites of previous intraparenchymal hemorrhage. Diffuse cerebral atrophy, chronic small vessel disease, and old bilateral basal ganglia lacune is remains stable. No evidence of hydrocephalus. No evidence of skull fracture.  CT CERVICAL SPINE FINDINGS  No evidence of acute fracture, subluxation, or prevertebral soft tissue swelling.  Mild to moderate degenerative disc disease is seen which is most inferior at C5-6 and C6-7. Mild to moderate facet DJD is seen bilaterally. Advanced atlantoaxial degenerative changes are also demonstrated. No other significant bone abnormality identified.  IMPRESSION: No acute intracranial findings.  No evidence of acute cervical spine fracture or subluxation. Degenerative cervical spondylosis.   Electronically Signed   By: Myles Rosenthal M.D.   On: 08/20/2013 00:18   Ct Cervical Spine Wo Contrast  08/20/2013   CLINICAL DATA:  Fall. Head and neck injury. Left scalp laceration. Previous traumatic intracranial hemorrhage.  EXAM: CT HEAD WITHOUT CONTRAST  CT CERVICAL SPINE WITHOUT CONTRAST  TECHNIQUE: Multidetector CT imaging of the head and cervical spine was performed following the standard protocol without intravenous contrast. Multiplanar CT image  reconstructions of the cervical spine were also generated.  COMPARISON:  02/26/2013  FINDINGS: CT HEAD FINDINGS  There is no evidence of intracranial hemorrhage, brain edema, or other signs of acute infarction. There is no evidence of intracranial mass lesion or mass effect. No abnormal extraaxial fluid collections are identified.  Bilateral frontal and right temporal lobe encephalomalacia is seen at sites of previous intraparenchymal hemorrhage. Diffuse cerebral atrophy, chronic small vessel disease, and old bilateral basal ganglia lacune is remains stable. No evidence of hydrocephalus. No evidence of skull fracture.  CT CERVICAL SPINE FINDINGS  No evidence of acute fracture, subluxation, or prevertebral soft tissue swelling.  Mild to moderate degenerative disc disease is seen which is most inferior at C5-6 and C6-7. Mild to moderate facet DJD is seen bilaterally. Advanced atlantoaxial degenerative changes are also demonstrated. No other significant bone abnormality identified.  IMPRESSION: No  acute intracranial findings.  No evidence of acute cervical spine fracture or subluxation. Degenerative cervical spondylosis.   Electronically Signed   By: Myles Rosenthal M.D.   On: 08/20/2013 00:18   Dg Humerus Right  09/15/2013   CLINICAL DATA:  Right humerus fracture.  EXAM: RIGHT HUMERUS - 2+ VIEW; DG C-ARM 1-60 MIN  COMPARISON:  Radiographs dated 08/19/2013  FINDINGS: Multiple C-arm images demonstrate the patient has undergone open reduction and internal fixation of spiral fracture of the proximal humeral shaft. Side plate and multiple screws have been inserted. There is improvement in the angulation and displacement of the fracture fragments.  IMPRESSION: Open reduction and internal fixation of proximal right humeral shaft fracture.   Electronically Signed   By: Geanie Cooley M.D.   On: 09/15/2013 14:17   Dg C-arm 1-60 Min  09/15/2013   CLINICAL DATA:  Right humerus fracture.  EXAM: RIGHT HUMERUS - 2+ VIEW; DG C-ARM  1-60 MIN  COMPARISON:  Radiographs dated 08/19/2013  FINDINGS: Multiple C-arm images demonstrate the patient has undergone open reduction and internal fixation of spiral fracture of the proximal humeral shaft. Side plate and multiple screws have been inserted. There is improvement in the angulation and displacement of the fracture fragments.  IMPRESSION: Open reduction and internal fixation of proximal right humeral shaft fracture.   Electronically Signed   By: Geanie Cooley M.D.   On: 09/15/2013 14:17    Disposition: 01-Home or Self Care  Discharge Orders   Future Orders Complete By Expires   Call MD / Call 911  As directed    Comments:     If you experience chest pain or shortness of breath, CALL 911 and be transported to the hospital emergency room.  If you develope a fever above 101.5 F, pus (white drainage) or increased drainage or redness at the wound, or calf pain, call your surgeon's office.   Constipation Prevention  As directed    Comments:     Drink plenty of fluids.  Prune juice may be helpful.  You may use a stool softener, such as Colace (over the counter) 100 mg twice a day.  Use MiraLax (over the counter) for constipation as needed.   Diet - low sodium heart healthy  As directed    Driving restrictions  As directed    Comments:     No driving while taking narcotic pain meds.   Increase activity slowly as tolerated  As directed    Weight bearing as tolerated  As directed    Questions:     Laterality:     Extremity:     Weight bearing as tolerated  As directed    Questions:     Laterality:     Extremity:        Follow-up Information   Follow up with Cheral Almas, MD In 2 weeks.   Specialty:  Orthopedic Surgery   Contact information:   [address] [place] [address] [phone]        Signed: Cheral Almas 09/16/2013, 6:03 PM

## 2013-09-16 NOTE — Progress Notes (Signed)
CSW Proofreader) spoke with Tammy and [facility]. Pt will be able to return tomorrow but the facility pharmacy closes early for the holiday so the facility will need dc summary and AVS no later than 11:00pm tomorrow. CSW (Clinical Child psychotherapist) informed pt nurse who will relay to MD.  Sharol Harness, LCSWA [phone]

## 2013-09-16 NOTE — Care Management Note (Signed)
CARE MANAGEMENT NOTE 09/16/2013  Patient:  Jerry [name], Jerry [name]   Account Number:  [id number]  Date Initiated:  09/16/2013  Documentation initiated by:  Vance Peper  Subjective/Objective Assessment:   79 yr old male s/p ORIF of right humeral shaft fracture     Action/Plan:   Patient will require shortterm SNF. Social worker is aware.   Anticipated DC Date:  09/17/2013   Anticipated DC Plan:  SKILLED NURSING FACILITY  In-house referral  Clinical Social Worker      DC Planning Services  CM consult      [facility] Choice  NA   Choice offered to / List presented to:     DME arranged  NA      DME agency  NA     HH arranged  NA      HH agency  NA   Status of service:  Completed, signed off   Discharge Disposition:  SKILLED NURSING FACILITY

## 2013-09-16 NOTE — Evaluation (Signed)
Occupational Therapy Evaluation Patient Details Name: Jerry [name] MRN: [id number] DOB: [birth year] Today's Date: 09/16/2013 Time: 4098-1191 OT Time Calculation (min): 15 min  OT Assessment / Plan / Recommendation History of present illness ORIF right humeral shaft fracture   Clinical Impression   This 79 yo male admitted and underwent above presents to acute OT with decreased balance, decreased use and P/AROM of RUE, increased edema RUE, and lives alone per his report. PT cannot take care of himself due to the aforementioned and thus will benefit from OT at [facility].    OT Assessment  Patient needs continued OT Services    Follow Up Recommendations  SNF    Barriers to Discharge Decreased caregiver support    Equipment Recommendations   (TBD at next venue)       Frequency  Min 3X/week    Precautions / Restrictions Precautions Precautions: Fall Required Braces or Orthoses: Sling Restrictions RUE Weight Bearing: Weight bearing as tolerated   Pertinent Vitals/Pain 7/10 RUE; repositioned, cold applied, and per nurse pre-medicated    ADL  Equipment Used: Gait belt Transfers/Ambulation Related to ADLs: Min A with quad cane for all ADL Comments: Pt currently is total A for all BADLs due to pain, balance, and decreased P/AROM    OT Diagnosis: Generalized weakness;Acute pain  OT Problem List: Decreased strength;Decreased range of motion;Impaired UE functional use;Impaired balance (sitting and/or standing);Pain;Increased edema;Decreased knowledge of use of DME or AE OT Treatment Interventions: Self-care/ADL training;Patient/family education;Therapeutic exercise;DME and/or AE instruction;Therapeutic activities;Balance training   OT Goals(Current goals can be found in the care plan section) Acute Rehab OT Goals OT Goal Formulation: With patient Time For Goal Achievement: 09/23/13 Potential to Achieve Goals: Good  Visit Information  Last OT Received On: 09/16/13 Assistance Needed:  +1 History of Present Illness: ORIF right humeral shaft fracture       Prior Functioning     Home Living Family/patient expects to be discharged to:: Private residence Living Arrangements: Alone Available Help at Discharge: Family;Available PRN/intermittently Type of Home: House Home Equipment: [name] - 2 wheels Prior Function Level of Independence: Independent Communication Communication: No difficulties Dominant Hand: Right         Vision/Perception Vision - History Patient Visual Report: No change from baseline   Cognition  Cognition Arousal/Alertness: Awake/alert Behavior During Therapy: WFL for tasks assessed/performed Overall Cognitive Status: Within Functional Limits for tasks assessed    Extremity/Trunk Assessment Upper Extremity Assessment Upper Extremity Assessment: RUE deficits/detail RUE Deficits / Details: This admission for ORIF of humeral shaft fracture, very edematous upper and lower arm--not tolerating any movement of it. RUE Coordination: decreased fine motor;decreased gross motor     Mobility Bed Mobility Bed Mobility: Supine to Sit;Sitting - Scoot to Edge of Bed Supine to Sit: 5: Supervision;HOB elevated Sitting - Scoot to Edge of Bed: 5: Supervision;With rail Transfers Transfers: Sit to Stand;Stand to Sit Sit to Stand: 4: Min assist;With upper extremity assist;From bed Stand to Sit: 4: Min assist;With upper extremity assist;With armrests;To chair/3-in-1 Details for Transfer Assistance: VCs for safe hand placement           End of Session OT - End of Session Equipment Utilized During Treatment: Gait belt (quad cane) Activity Tolerance: Patient limited by pain Patient left: in chair;with call bell/phone within reach Nurse Communication: Mobility status   Functional Assessment Tool Used: Clinical observation Functional Limitation: Self care Self Care Current Status (Y7829): At least 80 percent but less than 100 percent  impaired, limited or restricted  Self Care Goal Status [phone]): At least 80 percent but less than 100 percent impaired, limited or restricted Self Care Discharge Status [phone]): At least 80 percent but less than 100 percent impaired, limited or restricted   Jerry [name] [phone] 09/16/2013, 9:29 AM

## 2013-09-16 NOTE — Progress Notes (Signed)
CSW Proofreader) spoke with pt at bedside and spoke with pt daughter-in-law (wife of Qasim Diveley) on the phone. Pt family confirmed that pt is from [facility] center and plan is to return. CSW full assessment to follow.  Jerry [name], LCSWA [phone]

## 2013-09-16 NOTE — Progress Notes (Signed)
Occupational Therapy Treatment Patient Details Name: Jerry [name] MRN: [id number] DOB: 12-01-1923 Today's Date: 09/16/2013 Time: 4098-1191 OT Time Calculation (min): 25 min  OT Assessment / Plan / Recommendation  History of present illness ORIF right humeral shaft fracture   OT comments  This patient is really limited in his RUE ROM due to pain and edema, will continue to address this while he is here.  Follow Up Recommendations  SNF             Frequency Min 3X/week   Progress towards OT Goals Progress towards OT goals: Not progressing toward goals - comment (due to edema and pain)  Plan Discharge plan remains appropriate    Precautions / Restrictions Precautions Precautions: Fall Required Braces or Orthoses: Sling Restrictions RUE Weight Bearing: Weight bearing as tolerated   Pertinent Vitals/Pain 9/10 with ROM; repositioned and ice applied.      OT Goals(current goals can now be found in the care plan section)    Visit Information  Last OT Received On: 09/16/13 Assistance Needed: +1 History of Present Illness: ORIF right humeral shaft fracture          Cognition  Cognition Arousal/Alertness: Awake/alert Behavior During Therapy: WFL for tasks assessed/performed Overall Cognitive Status:  (Could not tell me where he was)       Exercises  Other Exercises Other Exercises: Worked with pt on movement of RUE P/AAROM. Pt really edematous throughout arm (hand is actually the best part), the edema has a gelatinous feel to it (probably due to immobilization of arm since accident and now surgery).  Performed some retrograde massage, myofacial therapy, massage, and pressure point at biceps tendon to try to get and increase range at elbow (only being able to get to -80 degrees of extension), then worked on [employer] within this range and up to 100 degrees flexion (10 reps). Next worked on shoulder P/AAROM pt able to tolerate 20 degrees of flexion x 10 reps and 20 degrees of  abduction x 10 reps      End of Session OT - End of Session Activity Tolerance: Patient limited by pain Patient left: in bed;with call bell/phone within reach       Evette Georges [phone] 09/16/2013, 2:22 PM

## 2013-09-17 LAB — COMPREHENSIVE METABOLIC PANEL
ALT: <5 U/L (ref 0–53)
AST: 10 U/L (ref 0–37)
Albumin: 2.6 g/dL — ABNORMAL LOW (ref 3.5–5.2)
Alkaline Phosphatase: 69 U/L (ref 39–117)
BUN: 13 mg/dL (ref 6–23)
Calcium: 8 mg/dL — ABNORMAL LOW (ref 8.4–10.5)
Potassium: 3.9 mEq/L (ref 3.5–5.1)
Sodium: 132 mEq/L — ABNORMAL LOW (ref 135–145)
Total Protein: 5.3 g/dL — ABNORMAL LOW (ref 6.0–8.3)

## 2013-09-17 LAB — CBC
HCT: 25.2 % — ABNORMAL LOW (ref 39.0–52.0)
MCH: 30.7 pg (ref 26.0–34.0)
MCV: 91 fL (ref 78.0–100.0)
RBC: 2.77 MIL/uL — ABNORMAL LOW (ref 4.22–5.81)
RDW: 13.6 % (ref 11.5–15.5)
WBC: 6.4 10*3/uL (ref 4.0–10.5)

## 2013-09-17 NOTE — Progress Notes (Signed)
Patient discharge to [facility] via son, Andrick Rust. Discharge packet given to son. Went over discharge instructions with family.

## 2013-09-17 NOTE — Care Management Note (Signed)
CARE MANAGEMENT NOTE 09/17/2013  Patient:  JOELLE, FLESSNER   Account Number:  [id number]  Date Initiated:  09/16/2013  Documentation initiated by:  Vance Peper  Subjective/Objective Assessment:   79 yr old male s/p ORIF of right humeral shaft fracture     :Anticipated DC Date:  09/17/2013   Anticipated DC Plan:  SKILLED NURSING FACILITY  In-house referral  Clinical Social Worker      DC Planning Services  CM consult      [facility] Choice  NA   Choice offered to / List presented to:     DME arranged  NA      DME agency  NA     HH arranged  HH-2 PT  HH-3 OT      HH agency  [facility]   Status of service:  Completed, signed off MDischarge Disposition:  [facility]      Comments:  09/17/13 11:33 Vance Peper, RN BSN Case Manager patient will be returning to [facility] home health to be provided by [facility] [employer] [facility].

## 2013-09-17 NOTE — Progress Notes (Signed)
Occupational Therapy Treatment Patient Details Name: Jerry [name] MRN: [id number] DOB: Jun 10, 1924 Today's Date: 09/17/2013 Time: 1050-1120 OT Time Calculation (min): 30 min  OT Assessment / Plan / Recommendation  History of present illness ORIF right humeral shaft fracture   OT comments  This 79 yo making slow progress with RUE due to edema and pain, was able to achieve more AAROM at shoulder today, but not at elbow.  Follow Up Recommendations  Home health OT             Frequency Min 3X/week   Progress towards OT Goals Progress towards OT goals: Progressing toward goals (slowly)  Plan Discharge plan needs to be updated (family wants him to go back to ALF with [facility])    Precautions / Restrictions Precautions Precautions: Fall Required Braces or Orthoses: Sling Restrictions Weight Bearing Restrictions: Yes RUE Weight Bearing: Weight bearing as tolerated   Pertinent Vitals/Pain 10/10 RUE; repositioned    ADL  ADL Comments: Family in to get pt to take him back to ALF, made them aware that pt does not have any restrictions of RUE (can WBAT and move as tolerated). Also made them aware that I had just ace wrapped his arm to try and help with  edema control.  Also informed them that we are not recommending that he be up on his feet unless someone is with him.      OT Goals(current goals can now be found in the care plan section)    Visit Information  Last OT Received On: 09/17/13 History of Present Illness: ORIF right humeral shaft fracture          Cognition  Cognition Arousal/Alertness: Awake/alert Behavior During Therapy: WFL for tasks assessed/performed Overall Cognitive Status: Within Functional Limits for tasks assessed       Exercises  Other Exercises Other Exercises: Pt's RUE has really not changed from an edema standpoint. Called Dr. Roda Shutters and he gave me the verbal order that I could ace wrap arm to try and help with edema control (wrapped distal to proximal with  ace wraps). Pt was able to tolerate AAROM of shoulder flexion and abduction better today (getting about 30 degrees of each x 10 reps), still not really tolerating elbow ROM (P/AA/A). Issued pt a squeeze ball that he is able to manage with S otherwise he forgets to do it.      End of Session OT - End of Session Activity Tolerance: Patient limited by pain Patient left: in chair;with call bell/phone within reach       Evette Georges [phone] 09/17/2013, 11:28 AM

## 2013-09-17 NOTE — Progress Notes (Signed)
Clinical Social Work Department BRIEF PSYCHOSOCIAL ASSESSMENT 09/17/2013  Patient:  Jerry [name], Jerry [name]     Account Number:  [id number]     Admit date:  09/15/2013  Clinical Social Worker:  Harless Nakayama  Date/Time:  09/16/2013 04:30 PM  Referred by:  Physician  Date Referred:  09/17/2013 Referred for  ALF Placement   Other Referral:   Interview type:  Patient Other interview type:   Spoke with pt and pt daughter-in-law    PSYCHOSOCIAL DATA Living Status:  FACILITY Admitted from facility:  [facility] Level of care:  Assisted Living Primary support name:  Zeshan Sena Primary support relationship to patient:  CHILD, ADULT Degree of support available:   Pt has very supportive family    CURRENT CONCERNS Current Concerns  Post-Acute Placement   Other Concerns:    SOCIAL WORK ASSESSMENT / PLAN CSW made aware of PT recomendation for SNF. CSW spoke with pt however pt was unable to clearly tell CSW where he was living. Pt gave permission for CSW to contact family. CSW was able to reach pt daughter-in-law who confirmed pt is from [facility]F. CSW explained referral for SNF. Pt family is wanting pt to return to ALF with HH. CSW spoke with facility and confirmed pt is able to return. They asked that pt please be set up with [facility]. CSW has informed CM. Pt family to provide transportation back from facility.   Assessment/plan status:  Psychosocial Support/Ongoing Assessment of Needs Other assessment/ plan:   Information/referral to community resources:   None needed    PATIENT'S/FAMILY'S RESPONSE TO PLAN OF CARE: Pt and pt family agreable to returning to ALF.       Jerry [name], LCSWA [phone]

## 2013-09-17 NOTE — Progress Notes (Signed)
CSW (Clinical Child psychotherapist) prepared pt dc packet and placed with shadow chart. CSW spoke with family to confirm they will be picking pt up. Family to be here no later than 11:30am. Pt,  pt nurse, and facility informed. CSW signing off.  Taniya Dasher, LCSWA [phone]

## 2013-09-23 ENCOUNTER — Encounter (HOSPITAL_COMMUNITY): Payer: Self-pay | Admitting: Orthopaedic Surgery

## 2013-09-29 ENCOUNTER — Encounter (HOSPITAL_COMMUNITY): Payer: Self-pay | Admitting: Orthopaedic Surgery

## 2013-10-03 ENCOUNTER — Emergency Department (HOSPITAL_COMMUNITY): Payer: Medicare Other

## 2013-10-03 ENCOUNTER — Emergency Department (HOSPITAL_COMMUNITY)
Admission: EM | Admit: 2013-10-03 | Discharge: 2013-10-04 | Disposition: A | Discharge status: Home / Self Care | Payer: Medicare Other | Attending: Emergency Medicine | Admitting: Emergency Medicine

## 2013-10-03 ENCOUNTER — Encounter (HOSPITAL_COMMUNITY): Payer: Self-pay | Admitting: Emergency Medicine

## 2013-10-03 DIAGNOSIS — I4891 Unspecified atrial fibrillation: Secondary | ICD-10-CM | POA: Insufficient documentation

## 2013-10-03 DIAGNOSIS — Z79899 Other long term (current) drug therapy: Secondary | ICD-10-CM | POA: Insufficient documentation

## 2013-10-03 DIAGNOSIS — Z9889 Other specified postprocedural states: Secondary | ICD-10-CM | POA: Insufficient documentation

## 2013-10-03 DIAGNOSIS — Z8719 Personal history of other diseases of the digestive system: Secondary | ICD-10-CM | POA: Insufficient documentation

## 2013-10-03 DIAGNOSIS — R110 Nausea: Secondary | ICD-10-CM | POA: Insufficient documentation

## 2013-10-03 DIAGNOSIS — Z8781 Personal history of (healed) traumatic fracture: Secondary | ICD-10-CM | POA: Insufficient documentation

## 2013-10-03 DIAGNOSIS — I10 Essential (primary) hypertension: Secondary | ICD-10-CM | POA: Insufficient documentation

## 2013-10-03 DIAGNOSIS — R0789 Other chest pain: Secondary | ICD-10-CM | POA: Insufficient documentation

## 2013-10-03 DIAGNOSIS — M79601 Pain in right arm: Secondary | ICD-10-CM

## 2013-10-03 DIAGNOSIS — Z87828 Personal history of other (healed) physical injury and trauma: Secondary | ICD-10-CM | POA: Insufficient documentation

## 2013-10-03 DIAGNOSIS — Z8701 Personal history of pneumonia (recurrent): Secondary | ICD-10-CM | POA: Insufficient documentation

## 2013-10-03 DIAGNOSIS — M79609 Pain in unspecified limb: Secondary | ICD-10-CM | POA: Insufficient documentation

## 2013-10-03 DIAGNOSIS — Z8739 Personal history of other diseases of the musculoskeletal system and connective tissue: Secondary | ICD-10-CM | POA: Insufficient documentation

## 2013-10-03 DIAGNOSIS — Z880 Allergy status to penicillin: Secondary | ICD-10-CM | POA: Insufficient documentation

## 2013-10-03 DIAGNOSIS — Z7901 Long term (current) use of anticoagulants: Secondary | ICD-10-CM | POA: Insufficient documentation

## 2013-10-03 DIAGNOSIS — J45909 Unspecified asthma, uncomplicated: Secondary | ICD-10-CM | POA: Insufficient documentation

## 2013-10-03 DIAGNOSIS — Z8673 Personal history of transient ischemic attack (TIA), and cerebral infarction without residual deficits: Secondary | ICD-10-CM | POA: Insufficient documentation

## 2013-10-03 DIAGNOSIS — R609 Edema, unspecified: Secondary | ICD-10-CM | POA: Insufficient documentation

## 2013-10-03 DIAGNOSIS — R079 Chest pain, unspecified: Secondary | ICD-10-CM

## 2013-10-03 LAB — BASIC METABOLIC PANEL
BUN: 12 mg/dL (ref 6–23)
CALCIUM: 8.6 mg/dL (ref 8.4–10.5)
CO2: 23 mEq/L (ref 19–32)
Chloride: 103 mEq/L (ref 96–112)
Creatinine, Ser: 1.46 mg/dL — ABNORMAL HIGH (ref 0.50–1.35)
GFR calc Af Amer: 48 mL/min — ABNORMAL LOW (ref >90)
GFR calc non Af Amer: 41 mL/min — ABNORMAL LOW (ref >90)
Glucose, Bld: 112 mg/dL — ABNORMAL HIGH (ref 70–99)
POTASSIUM: 4.3 meq/L (ref 3.7–5.3)
SODIUM: 140 meq/L (ref 137–147)

## 2013-10-03 LAB — CBC WITH DIFFERENTIAL/PLATELET
BASOS ABS: 0 10*3/uL (ref 0.0–0.1)
Basophils Relative: 0 % (ref 0–1)
Eosinophils Absolute: 0.2 10*3/uL (ref 0.0–0.7)
Eosinophils Relative: 2 % (ref 0–5)
HCT: 30.5 % — ABNORMAL LOW (ref 39.0–52.0)
Hemoglobin: 10 g/dL — ABNORMAL LOW (ref 13.0–17.0)
LYMPHS ABS: 1.8 10*3/uL (ref 0.7–4.0)
LYMPHS PCT: 19 % (ref 12–46)
MCH: 29.5 pg (ref 26.0–34.0)
MCHC: 32.8 g/dL (ref 30.0–36.0)
MCV: 90 fL (ref 78.0–100.0)
Monocytes Absolute: 1.1 10*3/uL — ABNORMAL HIGH (ref 0.1–1.0)
Monocytes Relative: 12 % (ref 3–12)
NEUTROS ABS: 6.5 10*3/uL (ref 1.7–7.7)
NEUTROS PCT: 67 % (ref 43–77)
PLATELETS: 211 10*3/uL (ref 150–400)
RBC: 3.39 MIL/uL — AB (ref 4.22–5.81)
RDW: 13.7 % (ref 11.5–15.5)
WBC: 9.6 10*3/uL (ref 4.0–10.5)

## 2013-10-03 LAB — POCT I-STAT TROPONIN I
TROPONIN I, POC: 0.01 ng/mL (ref 0.00–0.08)
Troponin i, poc: 0 ng/mL (ref 0.00–0.08)

## 2013-10-03 LAB — SEDIMENTATION RATE: SED RATE: 20 mm/h — AB (ref 0–16)

## 2013-10-03 MED ORDER — CLINDAMYCIN PHOSPHATE 900 MG/50ML IV SOLN
900.0000 mg | Freq: Once | INTRAVENOUS | Status: AC
  Administered 2013-10-03: 900 mg via INTRAVENOUS
  Filled 2013-10-03: qty 50

## 2013-10-03 NOTE — Discharge Instructions (Signed)
Chest Pain (Nonspecific)  You were seen today for chest pain and right arm pain. This is thought to be related to your recent surgery. Cardiac evaluation is negative. You were seen by her orthopedist and will followup in the office. Please return if he has no worsening pain, shortness of breath, or any other symptoms.  It is often hard to give a specific diagnosis for the cause of chest pain. There is always a chance that your pain could be related to something serious, such as a heart attack or a blood clot in the lungs. You need to follow up with your caregiver for further evaluation. CAUSES   Heartburn.  Pneumonia or bronchitis.  Anxiety or stress.  Inflammation around your heart (pericarditis) or lung (pleuritis or pleurisy).  A blood clot in the lung.  A collapsed lung (pneumothorax). It can develop suddenly on its own (spontaneous pneumothorax) or from injury (trauma) to the chest.  Shingles infection (herpes zoster virus). The chest wall is composed of bones, muscles, and cartilage. Any of these can be the source of the pain.  The bones can be bruised by injury.  The muscles or cartilage can be strained by coughing or overwork.  The cartilage can be affected by inflammation and become sore (costochondritis). DIAGNOSIS  Lab tests or other studies, such as X-rays, electrocardiography, stress testing, or cardiac imaging, may be needed to find the cause of your pain.  TREATMENT   Treatment depends on what may be causing your chest pain. Treatment may include:  Acid blockers for heartburn.  Anti-inflammatory medicine.  Pain medicine for inflammatory conditions.  Antibiotics if an infection is present.  You may be advised to change lifestyle habits. This includes stopping smoking and avoiding alcohol, caffeine, and chocolate.  You may be advised to keep your head raised (elevated) when sleeping. This reduces the chance of acid going backward from your stomach into your  esophagus.  Most of the time, nonspecific chest pain will improve within 2 to 3 days with rest and mild pain medicine. HOME CARE INSTRUCTIONS   If antibiotics were prescribed, take your antibiotics as directed. Finish them even if you start to feel better.  For the next few days, avoid physical activities that bring on chest pain. Continue physical activities as directed.  Do not smoke.  Avoid drinking alcohol.  Only take over-the-counter or prescription medicine for pain, discomfort, or fever as directed by your caregiver.  Follow your caregiver's suggestions for further testing if your chest pain does not go away.  Keep any follow-up appointments you made. If you do not go to an appointment, you could develop lasting (chronic) problems with pain. If there is any problem keeping an appointment, you must call to reschedule. SEEK MEDICAL CARE IF:   You think you are having problems from the medicine you are taking. Read your medicine instructions carefully.  Your chest pain does not go away, even after treatment.  You develop a rash with blisters on your chest. SEEK IMMEDIATE MEDICAL CARE IF:   You have increased chest pain or pain that spreads to your arm, neck, jaw, back, or abdomen.  You develop shortness of breath, an increasing cough, or you are coughing up blood.  You have severe back or abdominal pain, feel nauseous, or vomit.  You develop severe weakness, fainting, or chills.  You have a fever. THIS IS AN EMERGENCY. Do not wait to see if the pain will go away. Get medical help at once. Call your local  emergency services (911 in [place] Do not drive yourself to the hospital. MAKE SURE YOU:   Understand these instructions.  Will watch your condition.  Will get help right away if you are not doing well or get worse. Document Released: 06/21/2005 Document Revised: 12/04/2011 Document Reviewed: 04/16/2008 [facility] Patient Information 2014 [place].

## 2013-10-03 NOTE — ED Notes (Signed)
Raul DelMarty Gerber [phone] son

## 2013-10-03 NOTE — ED Provider Notes (Signed)
CSN: [id number]     Arrival date & time 10/03/13  1748 History   First MD Initiated Contact with Patient 10/03/13 1818     Chief Complaint  Patient presents with  . Chest Pain   (Consider location/radiation/quality/duration/timing/severity/associated sxs/prior Treatment) HPI  This is an 80 year old male who has a history of hypertension, stroke who presents with chest pain. Patient reports intermittent chest pain and nausea. He reports radiation into his right arm. He states that the pain started tonight but he is unsure what time. He received nitroglycerin and aspirin en route and he currently denies any chest pain but states his right arm hurts. He denies any shortness of breath during these episodes. Currently on Pradaxa for atrial fibrillation.   He had a recent humerus fracture and a status post ORIF. He denies any fevers. He reports 6/10 pain at the right upper extremity.  Past Medical History  Diagnosis Date  . Hypertension   . Arthritis   . Hernia   . Dysrhythmia     atrial fibrillation  . Head injury   . Humerus fracture     right  . Asthma     as a child  . Pneumonia   . Stroke     "light stroke"  . H/O hiatal hernia    Past Surgical History  Procedure Laterality Date  . Finger amputation Left     little  . Orif humerus fracture Right 09/15/2013    Procedure: OPEN REDUCTION INTERNAL FIXATION (ORIF) RIGHT HUMERAL SHAFT FRACTURE;  Surgeon: Cheral AlmasNaiping Michael Xu, MD;  Location: [facility] OR;  Service: Orthopedics;  Laterality: Right;   History reviewed. No pertinent family history. History  Substance Use Topics  . Smoking status: Never Smoker   . Smokeless tobacco: Former NeurosurgeonUser    Types: Chew  . Alcohol Use: Yes     Comment: heavy drinker    Review of Systems  Constitutional: Negative.  Negative for fever.  Respiratory: Positive for chest tightness. Negative for shortness of breath.   Cardiovascular: Negative.  Negative for chest pain.  Gastrointestinal: Positive for  nausea. Negative for abdominal pain.  Genitourinary: Negative.  Negative for dysuria.  Musculoskeletal: Negative for back pain.       Arm pain and swelling  Skin: Negative for rash.  Neurological: Negative for headaches.  All other systems reviewed and are negative.    Allergies  Hydrocodone; Penicillins; and Tetanus toxoids  Home Medications   Current Outpatient Rx  Name  Route  Sig  Dispense  Refill  . dabigatran (PRADAXA) 150 MG CAPS capsule   Oral   Take 150 mg by mouth 2 (two) times daily.         [name]. doxazosin (CARDURA) 8 MG tablet   Oral   Take 8 mg by mouth at bedtime.         . metoprolol (LOPRESSOR) 50 MG tablet   Oral   Take 50 mg by mouth daily.          BP 180/83  Pulse 98  Temp(Src) 98.2 F (36.8 C) (Oral)  Resp 21  SpO2 96% Physical Exam  Nursing note and vitals reviewed. Constitutional: He is oriented to person, place, and time. No distress.  Elderly  HENT:  Head: Normocephalic and atraumatic.  Eyes: Pupils are equal, round, and reactive to light.  Neck: Neck supple. No JVD present.  Cardiovascular: Normal rate, regular rhythm and normal heart sounds.   No murmur heard. Pulmonary/Chest: Effort normal and breath sounds normal. No  respiratory distress. He has no wheezes.  Abdominal: Soft. Bowel sounds are normal. There is no tenderness. There is no rebound.  Musculoskeletal: He exhibits edema.  Incision of right upper extremity clean dry and intact, there is induration and erythema medial to the incision with tenderness to palpation  Lymphadenopathy:    He has no cervical adenopathy.  Neurological: He is alert and oriented to person, place, and time.  Skin:  See musculoskeletal above  Psychiatric: He has a normal mood and affect.    ED Course  Procedures (including critical care time) Labs Review Labs Reviewed  CBC WITH DIFFERENTIAL - Abnormal; Notable for the following:    RBC 3.39 (*)    Hemoglobin 10.0 (*)    HCT 30.5 (*)     Monocytes Absolute 1.1 (*)    All other components within normal limits  BASIC METABOLIC PANEL - Abnormal; Notable for the following:    Glucose, Bld 112 (*)    Creatinine, Ser 1.46 (*)    GFR calc non Af Amer 41 (*)    GFR calc Af Amer 48 (*)    All other components within normal limits  SEDIMENTATION RATE - Abnormal; Notable for the following:    Sed Rate 20 (*)    All other components within normal limits  C-REACTIVE PROTEIN  POCT I-STAT TROPONIN I  POCT I-STAT TROPONIN I   Imaging Review Dg Chest 2 View  10/03/2013   CLINICAL DATA:  80 year old male with chest pain and shortness of breath.  EXAM: CHEST  2 VIEW  COMPARISON:  08/19/2013 and prior chest radiographs  FINDINGS: Cardiomegaly is identified.  Mild pulmonary vascular congestion identified.  Mild peribronchial thickening/probable COPD changes are stable  There is no evidence of focal airspace disease, pulmonary edema, suspicious pulmonary nodule/mass, pleural effusion, or pneumothorax. No acute bony abnormalities are identified.  IMPRESSION: Cardiomegaly with mild pulmonary vascular congestion.   Electronically Signed   By: Laveda Abbe M.D.   On: 10/03/2013 19:30   Dg Humerus Right  10/03/2013   CLINICAL DATA:  Status post fixation of a humerus fracture 09/15/2013. Right upper arm pain, redness and swelling.  EXAM: RIGHT HUMERUS - 2+ VIEW  COMPARISON:  Plain films 08/19/2013 and intraoperative fluoroscopic spot views 09/15/2013.  FINDINGS: Plate and screws are in place for fixation of a right humerus fracture. Hardware appears intact. No lucency about the hardware is identified to suggest loosening or infection. No soft tissue gas collection is present. There is no acute bony or joint abnormality.  IMPRESSION: No acute finding.  Status post ORIF right humerus fracture.   Electronically Signed   By: Drusilla Kanner M.D.   On: 10/03/2013 21:06    EKG Interpretation    Date/Time:  Friday October 03 2013 17:53:55 EST Ventricular Rate:   70 PR Interval:    QRS Duration: 85 QT Interval:  405 QTC Calculation: 437 R Axis:   16 Text Interpretation:  Atrial fibrillation Low voltage, extremity leads Nonspecific T abnormalities, lateral leads Confirmed by HORTON  MD, COURTNEY ([id number]) on 10/03/2013 6:17:05 PM            MDM   1. Right arm pain   2. Chest pain    Patient presents with right arm pain and chest pain. He is a generally poor historian but denies chest pain at this time with continued right arm pain. He is status post ORIF of the right upper extremity and there is tenderness to palpation.  Suspect postop pain may  be causing patient's chest pain. EKG is reassuring and shows rate-controlled atrial fibrillation. Patient has a known history. There is no evidence of ischemia. A delta set of troponins is negative. Given the erythema at the site, patient was given clindamycin and a sedimentation rate/CRP were sent. He was evaluated by Dr. Roda Shutters release the patient's exam is likely secondary to the alignment of his humerus which is not 100% straight. He may also have some hematoma. Patient continues to be chest pain-free. Have low suspicion at this time for ACS and given negative workup, we'll discharge patient home. He has followup with orthopedics.  After history, exam, and medical workup I feel the patient has been appropriately medically screened and is safe for discharge home. Pertinent diagnoses were discussed with the patient. Patient was given return precautions.   Shon Baton, MD 10/03/13 [phone]

## 2013-10-03 NOTE — ED Notes (Signed)
Pt c/o rt sided cp, pt unable to describe pain but states its a 7/10. Pt has swelling to rt arm, redness and warmth also noted. Pt c/o rt arm pain when he moves arm.

## 2013-10-03 NOTE — ED Notes (Signed)
Per EMs [facility] pt c/o intermittent CP and nausea non radiating. EMS started a 20 G in left wrist. Administered 324 mg of ASA and 4 Nitro, prior to this pain was 7/10 after pain is gone. Right upper arm red swollen and painful. 98% room air, CBG 98, BP 180/94. Takes pradaxa, hx of a.fib. Pt denies recent falls, pt has bruising to multiple spots on body.

## 2013-10-04 LAB — C-REACTIVE PROTEIN: CRP: <0.5 mg/dL — ABNORMAL LOW (ref <0.60)

## 2013-10-04 NOTE — ED Notes (Signed)
[facility] paged for transport 

## 2013-10-04 NOTE — Consult Note (Signed)
ORTHOPAEDIC CONSULTATION  REQUESTING PHYSICIAN: Shon Baton, MD  Chief Complaint: Right arm pain and swelling  HPI: Jerry [name] is a 80 y.o. male who complains of right arm swelling and pain.  He is 3 weeks s/p ORIF R humerus.  Reports improved pain overall and swelling of the arm.  Originally reported pain in chest that radiated to arm.    Past Medical History  Diagnosis Date  . Hypertension   . Arthritis   . Hernia   . Dysrhythmia     atrial fibrillation  . Head injury   . Humerus fracture     right  . Asthma     as a child  . Pneumonia   . Stroke     "light stroke"  . H/O hiatal hernia    Past Surgical History  Procedure Laterality Date  . Finger amputation Left     little  . Orif humerus fracture Right 09/15/2013    Procedure: OPEN REDUCTION INTERNAL FIXATION (ORIF) RIGHT HUMERAL SHAFT FRACTURE;  Surgeon: Cheral Almas, MD;  Location: [facility] OR;  Service: Orthopedics;  Laterality: Right;   History   Social History  . Marital Status: Widowed    Spouse Name: N/A    Number of Children: N/A  . Years of Education: N/A   Social History Main Topics  . Smoking status: Never Smoker   . Smokeless tobacco: Former Neurosurgeon    Types: Chew  . Alcohol Use: Yes     Comment: heavy drinker  . Drug Use: None  . Sexual Activity: None   Other Topics Concern  . None   Social History Narrative  . None   History reviewed. No pertinent family history. Allergies  Allergen Reactions  . Hydrocodone Other (See Comments)    Reaction unknown  . Penicillins Other (See Comments)    REACTION: unknown  . Tetanus Toxoids Other (See Comments)    unknown   Prior to Admission medications   Medication Sig Start Date End Date Taking? Authorizing Provider  dabigatran (PRADAXA) 150 MG CAPS capsule Take 150 mg by mouth 2 (two) times daily.   Yes Historical Provider, MD  doxazosin (CARDURA) 8 MG tablet Take 8 mg by mouth at bedtime.   Yes Historical Provider, MD  metoprolol  (LOPRESSOR) 50 MG tablet Take 50 mg by mouth daily.   Yes Historical Provider, MD   Dg Chest 2 View  10/03/2013   CLINICAL DATA:  80 year old male with chest pain and shortness of breath.  EXAM: CHEST  2 VIEW  COMPARISON:  08/19/2013 and prior chest radiographs  FINDINGS: Cardiomegaly is identified.  Mild pulmonary vascular congestion identified.  Mild peribronchial thickening/probable COPD changes are stable  There is no evidence of focal airspace disease, pulmonary edema, suspicious pulmonary nodule/mass, pleural effusion, or pneumothorax. No acute bony abnormalities are identified.  IMPRESSION: Cardiomegaly with mild pulmonary vascular congestion.   Electronically Signed   By: Laveda Abbe M.D.   On: 10/03/2013 19:30   Dg Humerus Right  10/03/2013   CLINICAL DATA:  Status post fixation of a humerus fracture 09/15/2013. Right upper arm pain, redness and swelling.  EXAM: RIGHT HUMERUS - 2+ VIEW  COMPARISON:  Plain films 08/19/2013 and intraoperative fluoroscopic spot views 09/15/2013.  FINDINGS: Plate and screws are in place for fixation of a right humerus fracture. Hardware appears intact. No lucency about the hardware is identified to suggest loosening or infection. No soft tissue gas collection is present. There is no acute bony or  joint abnormality.  IMPRESSION: No acute finding.  Status post ORIF right humerus fracture.   Electronically Signed   By: Drusilla Kannerhomas  Dalessio M.D.   On: 10/03/2013 21:06    Positive ROS: All other systems have been reviewed and were otherwise negative with the exception of those mentioned in the HPI and as above.  Physical Exam: General: Alert, no acute distress Cardiovascular: No pedal edema Respiratory: No cyanosis, no use of accessory musculature GI: No organomegaly, abdomen is soft and non-tender Skin: No lesions in the area of chief complaint Neurologic: Sensation intact distally Psychiatric: Patient is competent for consent with normal mood and affect Lymphatic: No  axillary or cervical lymphadenopathy  MUSCULOSKELETAL:  RUE: - incision is well healed - no signs of infection - ecchymosis and swelling improved - humerus is in clinical procrovatum and is tender to palpation - NVI distally  Assessment: Right humerus pain  Plan: - discussed the xrays with patient and patient understands that humerus is not fixed in anatomic alignment due to circumstances during the surgery - we agreed to continue to follow him clinically since he is getting better overall - NWB RUE, sling for comfort - f/u with me as scheduled in the office  Thank you for the consult and the opportunity to see Mr. Ramus  N. Glee ArvinMichael Prospero Mahnke, MD [facility] [phone] 12:18 AM

## 2013-10-04 NOTE — ED Notes (Signed)
Waiting on Ortho Tech to apply arm sling and [facility]

## 2013-10-04 NOTE — Progress Notes (Signed)
Orthopedic Tech Progress Note Patient Details:  Emmaline LifeJames C Mix 06/09/1924 [id number]  Ortho Devices Type of Ortho Device: Arm sling   Haskell Flirtewsome, Jere Bostrom M 10/04/2013, 12:31 AM

## 2014-01-25 ENCOUNTER — Emergency Department (HOSPITAL_COMMUNITY)
Admission: EM | Admit: 2014-01-25 | Discharge: 2014-01-25 | Disposition: A | Discharge status: Home / Self Care | Payer: Medicare Other | Attending: Emergency Medicine | Admitting: Emergency Medicine

## 2014-01-25 ENCOUNTER — Encounter (HOSPITAL_COMMUNITY): Payer: Self-pay | Admitting: Emergency Medicine

## 2014-01-25 ENCOUNTER — Emergency Department (HOSPITAL_COMMUNITY): Payer: Medicare Other

## 2014-01-25 DIAGNOSIS — S46909A Unspecified injury of unspecified muscle, fascia and tendon at shoulder and upper arm level, unspecified arm, initial encounter: Secondary | ICD-10-CM | POA: Insufficient documentation

## 2014-01-25 DIAGNOSIS — W19XXXA Unspecified fall, initial encounter: Secondary | ICD-10-CM

## 2014-01-25 DIAGNOSIS — I4891 Unspecified atrial fibrillation: Secondary | ICD-10-CM | POA: Diagnosis not present

## 2014-01-25 DIAGNOSIS — S0180XA Unspecified open wound of other part of head, initial encounter: Secondary | ICD-10-CM | POA: Diagnosis not present

## 2014-01-25 DIAGNOSIS — M25511 Pain in right shoulder: Secondary | ICD-10-CM

## 2014-01-25 DIAGNOSIS — J45909 Unspecified asthma, uncomplicated: Secondary | ICD-10-CM | POA: Insufficient documentation

## 2014-01-25 DIAGNOSIS — Z8673 Personal history of transient ischemic attack (TIA), and cerebral infarction without residual deficits: Secondary | ICD-10-CM | POA: Insufficient documentation

## 2014-01-25 DIAGNOSIS — Z8781 Personal history of (healed) traumatic fracture: Secondary | ICD-10-CM | POA: Diagnosis not present

## 2014-01-25 DIAGNOSIS — S0990XA Unspecified injury of head, initial encounter: Secondary | ICD-10-CM | POA: Diagnosis present

## 2014-01-25 DIAGNOSIS — W1809XA Striking against other object with subsequent fall, initial encounter: Secondary | ICD-10-CM | POA: Diagnosis not present

## 2014-01-25 DIAGNOSIS — Y92009 Unspecified place in unspecified non-institutional (private) residence as the place of occurrence of the external cause: Secondary | ICD-10-CM | POA: Insufficient documentation

## 2014-01-25 DIAGNOSIS — M129 Arthropathy, unspecified: Secondary | ICD-10-CM | POA: Insufficient documentation

## 2014-01-25 DIAGNOSIS — Z8719 Personal history of other diseases of the digestive system: Secondary | ICD-10-CM | POA: Diagnosis not present

## 2014-01-25 DIAGNOSIS — Z79899 Other long term (current) drug therapy: Secondary | ICD-10-CM | POA: Diagnosis not present

## 2014-01-25 DIAGNOSIS — Y9389 Activity, other specified: Secondary | ICD-10-CM | POA: Diagnosis not present

## 2014-01-25 DIAGNOSIS — S4980XA Other specified injuries of shoulder and upper arm, unspecified arm, initial encounter: Secondary | ICD-10-CM | POA: Insufficient documentation

## 2014-01-25 DIAGNOSIS — Z880 Allergy status to penicillin: Secondary | ICD-10-CM | POA: Diagnosis not present

## 2014-01-25 DIAGNOSIS — Z8701 Personal history of pneumonia (recurrent): Secondary | ICD-10-CM | POA: Diagnosis not present

## 2014-01-25 DIAGNOSIS — I10 Essential (primary) hypertension: Secondary | ICD-10-CM | POA: Diagnosis not present

## 2014-01-25 DIAGNOSIS — Z7902 Long term (current) use of antithrombotics/antiplatelets: Secondary | ICD-10-CM | POA: Diagnosis not present

## 2014-01-25 DIAGNOSIS — R42 Dizziness and giddiness: Secondary | ICD-10-CM | POA: Diagnosis not present

## 2014-01-25 DIAGNOSIS — R110 Nausea: Secondary | ICD-10-CM | POA: Insufficient documentation

## 2014-01-25 DIAGNOSIS — S0181XA Laceration without foreign body of other part of head, initial encounter: Secondary | ICD-10-CM

## 2014-01-25 LAB — BASIC METABOLIC PANEL
BUN: 13 mg/dL (ref 6–23)
CO2: 23 mEq/L (ref 19–32)
Calcium: 8.9 mg/dL (ref 8.4–10.5)
Creatinine, Ser: 1.3 mg/dL (ref 0.50–1.35)
GFR calc Af Amer: 55 mL/min — ABNORMAL LOW (ref >90)

## 2014-01-25 LAB — BASIC METABOLIC PANEL WITH GFR
Chloride: 103 meq/L (ref 96–112)
GFR calc non Af Amer: 48 mL/min — ABNORMAL LOW (ref >90)
Glucose, Bld: 132 mg/dL — ABNORMAL HIGH (ref 70–99)
Potassium: 4.1 meq/L (ref 3.7–5.3)
Sodium: 140 meq/L (ref 137–147)

## 2014-01-25 LAB — CBC
HCT: 37.6 % — ABNORMAL LOW (ref 39.0–52.0)
Hemoglobin: 12.8 g/dL — ABNORMAL LOW (ref 13.0–17.0)
MCH: 27.7 pg (ref 26.0–34.0)
MCHC: 34 g/dL (ref 30.0–36.0)
MCV: 81.4 fL (ref 78.0–100.0)
Platelets: 113 10*3/uL — ABNORMAL LOW (ref 150–400)
RBC: 4.62 MIL/uL (ref 4.22–5.81)
RDW: 15 % (ref 11.5–15.5)
WBC: 7.2 10*3/uL (ref 4.0–10.5)

## 2014-01-25 MED ORDER — LIDOCAINE-EPINEPHRINE-TETRACAINE (LET) SOLUTION
3.0000 mL | Freq: Once | NASAL | Status: DC
  Filled 2014-01-25: qty 3

## 2014-01-25 MED ORDER — TRAMADOL HCL 50 MG PO TABS: 50.0000 mg | ORAL_TABLET | Freq: Four times a day (QID) | ORAL | Status: DC | PRN

## 2014-01-25 NOTE — ED Notes (Signed)
Patient ambulated around unit with walker without incident.

## 2014-01-25 NOTE — ED Notes (Signed)
Pt has small cut to upper lip.

## 2014-01-25 NOTE — ED Provider Notes (Signed)
CSN: [id number]     Arrival date & time 01/25/14  [id number] History   First MD Initiated Contact with Patient 01/25/14 0845     Chief Complaint  Patient presents with  . Fall     (Consider location/radiation/quality/duration/timing/severity/associated sxs/prior Treatment) HPI Comments: [facility]  Pt with h/o falls in the past.  Pt with a h/o atrial fib on pradaxa.  Pt has a laceration above eyebrow.  Pt reports mild HA and pain to right shoulder.  Pt indicates he has injured his right shoulder in the past, unsure if he re injured or not.  He thinks he as a little dizzy, then got his feet tangled up.  No CP, SOB, did not feel lightheaded.  No back pain.  No current neck pain.  Denies pain to hips, lower legs.  Pt had nausea on ambulance en route.      Patient is a 80 y.o. male presenting with fall. The history is provided by the patient.  Fall This is a recurrent problem. Associated symptoms include headaches. Pertinent negatives include no chest pain and no abdominal pain.    Past Medical History  Diagnosis Date  . Hypertension   . Arthritis   . Hernia   . Dysrhythmia     atrial fibrillation  . Head injury   . Humerus fracture     right  . Asthma     as a child  . Pneumonia   . Stroke     "light stroke"  . H/O hiatal hernia    Past Surgical History  Procedure Laterality Date  . Finger amputation Left     little  . Orif humerus fracture Right 09/15/2013    Procedure: OPEN REDUCTION INTERNAL FIXATION (ORIF) RIGHT HUMERAL SHAFT FRACTURE;  Surgeon: Cheral AlmasNaiping Domonic Kimball Xu, MD;  Location: [facility] OR;  Service: Orthopedics;  Laterality: Right;   History reviewed. No pertinent family history. History  Substance Use Topics  . Smoking status: Never Smoker   . Smokeless tobacco: Former NeurosurgeonUser    Types: Chew  . Alcohol Use: Yes     Comment: heavy drinker    Review of Systems  Cardiovascular: Negative for chest pain.  Gastrointestinal: Positive for nausea. Negative  for vomiting and abdominal pain.  Genitourinary: Negative for flank pain.  Musculoskeletal: Positive for arthralgias. Negative for back pain and neck pain.  Skin: Positive for wound.  Neurological: Positive for dizziness and headaches. Negative for syncope.  All other systems reviewed and are negative.     Allergies  Hydrocodone; Penicillins; and Tetanus toxoids  Home Medications   Prior to Admission medications   Medication Sig Start Date End Date Taking? Authorizing Provider  amLODipine (NORVASC) 10 MG tablet Take 10 mg by mouth daily.   Yes Historical Provider, MD  dabigatran (PRADAXA) 150 MG CAPS capsule Take 150 mg by mouth 2 (two) times daily.   Yes Historical Provider, MD  doxazosin (CARDURA) 8 MG tablet Take 8 mg by mouth at bedtime.   Yes Historical Provider, MD  HYDROcodone-acetaminophen (NORCO/VICODIN) 5-325 MG per tablet Take 1 tablet by mouth every 6 (six) hours as needed for moderate pain.   Yes Historical Provider, MD  metoprolol (LOPRESSOR) 50 MG tablet Take 50 mg by mouth 2 (two) times daily.    Yes Historical Provider, MD   BP 166/81  Pulse 77  Temp(Src) 97.6 F (36.4 C) (Oral)  Resp 18  SpO2 99% Physical Exam  Nursing note and vitals reviewed. Constitutional: He appears well-developed  and well-nourished. No distress.  HENT:  Head: Normocephalic.    Eyes: EOM are normal.  Cardiovascular: Normal rate and intact distal pulses.   Pulmonary/Chest: Effort normal. No respiratory distress. He has no wheezes.  Abdominal: Soft. He exhibits no distension. There is no tenderness. There is no rebound.  Neurological: He is alert.  Skin: Skin is warm. He is not diaphoretic.    ED Course  LACERATION REPAIR Date/Time: 01/25/2014 9:44 AM Performed by: Lear Ng Authorized by: Lear Ng Consent: Verbal consent obtained. Risks and benefits: risks, benefits and alternatives were discussed Consent given by: patient Patient understanding: patient states  understanding of the procedure being performed Patient consent: the patient's understanding of the procedure matches consent given Patient identity confirmed: arm band Time out: Immediately prior to procedure a "time out" was called to verify the correct patient, procedure, equipment, support staff and site/side marked as required. Body area: head/neck Location details: right eyebrow Laceration length: 1.5 cm Foreign bodies: no foreign bodies Tendon involvement: none Nerve involvement: none Vascular damage: no Anesthesia: local infiltration Local anesthetic: lidocaine 1% with epinephrine Patient sedated: no Preparation: Patient was prepped and draped in the usual sterile fashion. Irrigation solution: saline Irrigation method: jet lavage Amount of cleaning: standard Skin closure: 6-0 Prolene Number of sutures: 2 Technique: simple Approximation: close Approximation difficulty: simple Patient tolerance: Patient tolerated the procedure well with no immediate complications.   (including critical care time) Labs Review Labs Reviewed  CBC - Abnormal; Notable for the following:    Hemoglobin 12.8 (*)    HCT 37.6 (*)    All other components within normal limits  BASIC METABOLIC PANEL    Imaging Review Dg Shoulder Right  01/25/2014   CLINICAL DATA:  Fall with right shoulder pain.  EXAM: RIGHT SHOULDER - 2+ VIEW  COMPARISON:  10/03/2013 and 08/19/2013 radiographs  FINDINGS: There is no evidence of acute fracture, subluxation or dislocation.  Internal plate and screw fixation traversing a remote fracture of the proximal humerus noted.  Mild glenohumeral joint degenerative changes are noted.  No focal bony lesions are present.  IMPRESSION: No evidence of acute abnormality.   Electronically Signed   By: Laveda Abbe M.D.   On: 01/25/2014 10:30   Ct Head Wo Contrast  01/25/2014   CLINICAL DATA:  Post fall, now with small laceration to the right forehead above the eyebrow  EXAM: CT HEAD WITHOUT  CONTRAST  CT CERVICAL SPINE WITHOUT CONTRAST  TECHNIQUE: Multidetector CT imaging of the head and cervical spine was performed following the standard protocol without intravenous contrast. Multiplanar CT image reconstructions of the cervical spine were also generated.  COMPARISON:  CT HEAD W/O CM dated 08/19/2013  FINDINGS: CT HEAD FINDINGS  There is geographic soft tissue swelling about the right side of the forehead (image 22, series 201) this finding is without associated radiopaque foreign body or displaced calvarial fracture.  Similar findings of advanced atrophy with sulcal prominence and mild prominence of the bifrontal extra-axial spaces. Stable sequela of prior infarction involving the anterior aspect of the right temporal lobe as well as the subcortical aspect of the left frontal lobe (image 19, series 201). Extensive hypodensities compatible with microvascular ischemic disease are unchanged with mild ex vacuo dilatation of the ventricular system. Given extensive background parenchymal abnormalities, there is no CT evidence of acute superimposed large territory infarct. No definite intraparenchymal or extra-axial mass or hemorrhage. Unchanged size and configuration of the ventricles and basilar cisterns. No midline shift. Limited visualization  of the paranasal sinuses and mastoid air cells are normal. Post bilateral cataract surgery.  CT CERVICAL SPINE FINDINGS  C1 to the superior endplate of T3 is imaged.  Normal alignment of the cervical spine. No anterolisthesis or retrolisthesis. The bilateral facets are normally aligned. The dens is normally positioned between the lateral masses of C1. Moderate degenerative change of the atlantodental articulation. Normal atlantoaxial articulations.  No fracture or static subluxation of the cervical spine. Cervical vertebral body heights are preserved. Prevertebral soft tissues are normal.  There is mild-to-moderate multilevel DDD throughout the cervical spine, likely  worse at C5-C6 and to a lesser extent, C4-C5 and C6-C7 with disc space height loss, endplate irregularity and small posteriorly directed disc osteophyte complexes at these locations.  There is partial ossification of the nuchal ligament posterior to the C4 and C5 spinous processes.  Atherosclerotic plaque within the bilateral carotid bulbs, right greater than left. Normal noncontrast appearance of the thyroid gland. No bulky cervical lymphadenopathy on this noncontrast examination. Limited visualization of lung apices demonstrates grossly symmetric biapical pleural parenchymal thickening and mild paraseptal emphysematous change.  IMPRESSION: 1. Soft tissue swelling about the right side of the forehead without associated radiopaque foreign body, displaced calvarial fracture or acute intracranial process. 2. Stable findings of advanced atrophy, microvascular ischemic disease and sequela of prior infarction involving the right temporal and left frontal lobes. 3. No fracture or static subluxation of the cervical spine. 4. Mild to moderate DDD throughout the cervical spine, worse at C5-C6.   Electronically Signed   By: Simonne Come M.D.   On: 01/25/2014 10:47   Ct Cervical Spine Wo Contrast  01/25/2014   CLINICAL DATA:  Post fall, now with small laceration to the right forehead above the eyebrow  EXAM: CT HEAD WITHOUT CONTRAST  CT CERVICAL SPINE WITHOUT CONTRAST  TECHNIQUE: Multidetector CT imaging of the head and cervical spine was performed following the standard protocol without intravenous contrast. Multiplanar CT image reconstructions of the cervical spine were also generated.  COMPARISON:  CT HEAD W/O CM dated 08/19/2013  FINDINGS: CT HEAD FINDINGS  There is geographic soft tissue swelling about the right side of the forehead (image 22, series 201) this finding is without associated radiopaque foreign body or displaced calvarial fracture.  Similar findings of advanced atrophy with sulcal prominence and mild  prominence of the bifrontal extra-axial spaces. Stable sequela of prior infarction involving the anterior aspect of the right temporal lobe as well as the subcortical aspect of the left frontal lobe (image 19, series 201). Extensive hypodensities compatible with microvascular ischemic disease are unchanged with mild ex vacuo dilatation of the ventricular system. Given extensive background parenchymal abnormalities, there is no CT evidence of acute superimposed large territory infarct. No definite intraparenchymal or extra-axial mass or hemorrhage. Unchanged size and configuration of the ventricles and basilar cisterns. No midline shift. Limited visualization of the paranasal sinuses and mastoid air cells are normal. Post bilateral cataract surgery.  CT CERVICAL SPINE FINDINGS  C1 to the superior endplate of T3 is imaged.  Normal alignment of the cervical spine. No anterolisthesis or retrolisthesis. The bilateral facets are normally aligned. The dens is normally positioned between the lateral masses of C1. Moderate degenerative change of the atlantodental articulation. Normal atlantoaxial articulations.  No fracture or static subluxation of the cervical spine. Cervical vertebral body heights are preserved. Prevertebral soft tissues are normal.  There is mild-to-moderate multilevel DDD throughout the cervical spine, likely worse at C5-C6 and to a lesser extent, C4-C5  and C6-C7 with disc space height loss, endplate irregularity and small posteriorly directed disc osteophyte complexes at these locations.  There is partial ossification of the nuchal ligament posterior to the C4 and C5 spinous processes.  Atherosclerotic plaque within the bilateral carotid bulbs, right greater than left. Normal noncontrast appearance of the thyroid gland. No bulky cervical lymphadenopathy on this noncontrast examination. Limited visualization of lung apices demonstrates grossly symmetric biapical pleural parenchymal thickening and mild  paraseptal emphysematous change.  IMPRESSION: 1. Soft tissue swelling about the right side of the forehead without associated radiopaque foreign body, displaced calvarial fracture or acute intracranial process. 2. Stable findings of advanced atrophy, microvascular ischemic disease and sequela of prior infarction involving the right temporal and left frontal lobes. 3. No fracture or static subluxation of the cervical spine. 4. Mild to moderate DDD throughout the cervical spine, worse at C5-C6.   Electronically Signed   By: Simonne ComeJohn  Watts M.D.   On: 01/25/2014 10:47     EKG Interpretation   Date/Time:  Sunday Jan 25 2014 08:52:46 EDT Ventricular Rate:  70 PR Interval:    QRS Duration: 93 QT Interval:  440 QTC Calculation: 475 R Axis:   98 Text Interpretation:  Atrial fibrillation Low voltage, extremity leads  Probable anteroseptal infarct, old Nonspecific T abnormalities, lateral  leads No significant change since last tracing Confirmed by [facility]  MD,  MICHEAL ([id number]) on 01/25/2014 9:22:16 AM     RA sat is 98% and I interpret to be adequate  11:11 AM CT's shows no sig acute abn's.  Will try to ambulate pt.  Will remove ccollar.  If he can ambulate easily, will d/c home and pt provided head injury cautions.  Sutures out in 3-5 days    MDM   Final diagnoses:  Facial laceration  Minor head injury  Fall  Shoulder pain, right    Pt is s/o probably mechanical fall.  Will get CBC and BMP as well.  Denies syncope.  No LOC after injury, however he is on pradaxa and has nausea.  Will get CT of head and cervical spine.      Gavin PoundMichael Y. Aasir Daigler, MD 01/25/14 1113

## 2014-01-25 NOTE — ED Notes (Addendum)
[email or web address]. Pt was standing in line waiting on cafeteria to open for breakfast. Staff stated that pt fell against the wall and then to the ground landing on right side and hitting head. Pt has small lac to right forehead above eyebrow. Blood noted to right nare. Pt has a recent hx of right arm fx. No deformities noted. Tender to palpation. Pt did have an incontinent episode. Pt c/o generalized pain all over which is normal for him but new c/o neck pain. No LOC. Denies nausea on scene but when arrived to ED pt c/o nausea.

## 2014-01-25 NOTE — Discharge Instructions (Signed)
Facial Laceration ° A facial laceration is a cut on the face. These injuries can be painful and cause bleeding. Lacerations usually heal quickly, but they need special care to reduce scarring. °DIAGNOSIS  °Your health care provider will take a medical history, ask for details about how the injury occurred, and examine the wound to determine how deep the cut is. °TREATMENT  °Some facial lacerations may not require closure. Others may not be able to be closed because of an increased risk of infection. The risk of infection and the chance for successful closure will depend on various factors, including the amount of time since the injury occurred. °The wound may be cleaned to help prevent infection. If closure is appropriate, pain medicines may be given if needed. Your health care provider will use stitches (sutures), wound glue (adhesive), or skin adhesive strips to repair the laceration. These tools bring the skin edges together to allow for faster healing and a better cosmetic outcome. If needed, you may also be given a tetanus shot. °HOME CARE INSTRUCTIONS °· Only take over-the-counter or prescription medicines as directed by your health care provider. °· Follow your health care provider's instructions for wound care. These instructions will vary depending on the technique used for closing the wound. °For Sutures: °· Keep the wound clean and dry.   °· If you were given a bandage (dressing), you should change it at least once a day. Also change the dressing if it becomes wet or dirty, or as directed by your health care provider.   °· Wash the wound with soap and water 2 times a day. Rinse the wound off with water to remove all soap. Pat the wound dry with a clean towel.   °· After cleaning, apply a thin layer of the antibiotic ointment recommended by your health care provider. This will help prevent infection and keep the dressing from sticking.   °· You may shower as usual after the first 24 hours. Do not soak the  wound in water until the sutures are removed.   °· Get your sutures removed as directed by your health care provider. With facial lacerations, sutures should usually be taken out after 4 5 days to avoid stitch marks.   °· Wait a few days after your sutures are removed before applying any makeup. °For Skin Adhesive Strips: °· Keep the wound clean and dry.   °· Do not get the skin adhesive strips wet. You may bathe carefully, using caution to keep the wound dry.   °· If the wound gets wet, pat it dry with a clean towel.   °· Skin adhesive strips will fall off on their own. You may trim the strips as the wound heals. Do not remove skin adhesive strips that are still stuck to the wound. They will fall off in time.   °For Wound Adhesive: °· You may briefly wet your wound in the shower or bath. Do not soak or scrub the wound. Do not swim. Avoid periods of heavy sweating until the skin adhesive has fallen off on its own. After showering or bathing, gently pat the wound dry with a clean towel.   °· Do not apply liquid medicine, cream medicine, ointment medicine, or makeup to your wound while the skin adhesive is in place. This may loosen the film before your wound is healed.   °· If a dressing is placed over the wound, be careful not to apply tape directly over the skin adhesive. This may cause the adhesive to be pulled off before the wound is healed.   °·   Avoid prolonged exposure to sunlight or tanning lamps while the skin adhesive is in place.  The skin adhesive will usually remain in place for 5 10 days, then naturally fall off the skin. Do not pick at the adhesive film.  After Healing: Once the wound has healed, cover the wound with sunscreen during the day for 1 full year. This can help minimize scarring. Exposure to ultraviolet light in the first year will darken the scar. It can take 1 2 years for the scar to lose its redness and to heal completely.  SEEK IMMEDIATE MEDICAL CARE IF:  You have redness, pain, or  swelling around the wound.   You see ayellowish-white fluid (pus) coming from the wound.   You have chills or a fever.  MAKE SURE YOU:  Understand these instructions.  Will watch your condition.  Will get help right away if you are not doing well or get worse. Document Released: 10/19/2004 Document Revised: 07/02/2013 Document Reviewed: 04/24/2013 [facility] Patient Information 2014 [place], [place].     Head Injury, Adult You have received a head injury. It does not appear serious at this time. Headaches and vomiting are common following head injury. It should be easy to awaken from sleeping. Sometimes it is necessary for you to stay in the emergency department for a while for observation. Sometimes admission to the hospital may be needed. After injuries such as yours, most problems occur within the first 24 hours, but side effects may occur up to 7 10 days after the injury. It is important for you to carefully monitor your condition and contact your health care provider or seek immediate medical care if there is a change in your condition. WHAT ARE THE TYPES OF HEAD INJURIES? Head injuries can be as minor as a bump. Some head injuries can be more severe. More severe head injuries include:  A jarring injury to the brain (concussion).  A bruise of the brain (contusion). This mean there is bleeding in the brain that can cause swelling.  A cracked skull (skull fracture).  Bleeding in the brain that collects, clots, and forms a bump (hematoma). WHAT CAUSES A HEAD INJURY? A serious head injury is most likely to happen to someone who is in a car wreck and is not wearing a seat belt. Other causes of major head injuries include bicycle or motorcycle accidents, sports injuries, and falls. HOW ARE HEAD INJURIES DIAGNOSED? A complete history of the event leading to the injury and your current symptoms will be helpful in diagnosing head injuries. Many times, pictures of the brain, such as CT  or MRI are needed to see the extent of the injury. Often, an overnight hospital stay is necessary for observation.  WHEN SHOULD I SEEK IMMEDIATE MEDICAL CARE?  You should get help right away if:  You have confusion or drowsiness.  You feel sick to your stomach (nauseous) or have continued, forceful vomiting.  You have dizziness or unsteadiness that is getting worse.  You have severe, continued headaches not relieved by medicine. Only take over-the-counter or prescription medicines for pain, fever, or discomfort as directed by your health care provider.  You do not have normal function of the arms or legs or are unable to walk.  You notice changes in the black spots in the center of the colored part of your eye (pupil).  You have a clear or bloody fluid coming from your nose or ears.  You have a loss of vision. During the next 24 hours  after the injury, you must stay with someone who can watch you for the warning signs. This person should contact local emergency services (911 in the [place] if you have seizures, you become unconscious, or you are unable to wake up. HOW CAN I PREVENT A HEAD INJURY IN THE FUTURE? The most important factor for preventing major head injuries is avoiding motor vehicle accidents. To minimize the potential for damage to your head, it is crucial to wear seat belts while riding in motor vehicles. Wearing helmets while bike riding and playing collision sports (like football) is also helpful. Also, avoiding dangerous activities around the house will further help reduce your risk of head injury.  WHEN CAN I RETURN TO NORMAL ACTIVITIES AND ATHLETICS? You should be reevaluated by your health care provider before returning to these activities. If you have any of the following symptoms, you should not return to activities or contact sports until 1 week after the symptoms have stopped:  Persistent headache.  Dizziness or vertigo.  Poor attention and  concentration.  Confusion.  Memory problems.  Nausea or vomiting.  Fatigue or tire easily.  Irritability.  Intolerant of bright lights or loud noises.  Anxiety or depression.  Disturbed sleep. MAKE SURE YOU:   Understand these instructions.  Will watch your condition.  Will get help right away if you are not doing well or get worse. Document Released: 09/11/2005 Document Revised: 07/02/2013 Document Reviewed: 05/19/2013 [facility] Patient Information 2014 [place], [place].     Shoulder Pain The shoulder is the joint that connects your arms to your body. The bones that form the shoulder joint include the upper arm bone (humerus), the shoulder blade (scapula), and the collarbone (clavicle). The top of the humerus is shaped like a ball and fits into a rather flat socket on the scapula (glenoid cavity). A combination of muscles and strong, fibrous tissues that connect muscles to bones (tendons) support your shoulder joint and hold the ball in the socket. Small, fluid-filled sacs (bursae) are located in different areas of the joint. They act as cushions between the bones and the overlying soft tissues and help reduce friction between the gliding tendons and the bone as you move your arm. Your shoulder joint allows a wide range of motion in your arm. This range of motion allows you to do things like scratch your back or throw a ball. However, this range of motion also makes your shoulder more prone to pain from overuse and injury. Causes of shoulder pain can originate from both injury and overuse and usually can be grouped in the following four categories:  Redness, swelling, and pain (inflammation) of the tendon (tendinitis) or the bursae (bursitis).  Instability, such as a dislocation of the joint.  Inflammation of the joint (arthritis).  Broken bone (fracture). HOME CARE INSTRUCTIONS   Apply ice to the sore area.  Put ice in a plastic bag.  Place a towel between your skin and  the bag.  Leave the ice on for 15-20 minutes, 03-04 times per day for the first 2 days.  Stop using cold packs if they do not help with the pain.  If you have a shoulder sling or immobilizer, wear it as long as your caregiver instructs. Only remove it to shower or bathe. Move your arm as little as possible, but keep your hand moving to prevent swelling.  Squeeze a soft ball or foam pad as much as possible to help prevent swelling.  Only take over-the-counter or prescription medicines for  pain, discomfort, or fever as directed by your caregiver. SEEK MEDICAL CARE IF:   Your shoulder pain increases, or new pain develops in your arm, hand, or fingers.  Your hand or fingers become cold and numb.  Your pain is not relieved with medicines. SEEK IMMEDIATE MEDICAL CARE IF:   Your arm, hand, or fingers are numb or tingling.  Your arm, hand, or fingers are significantly swollen or turn white or blue. MAKE SURE YOU:   Understand these instructions.  Will watch your condition.  Will get help right away if you are not doing well or get worse. Document Released: 06/21/2005 Document Revised: 06/05/2012 Document Reviewed: 08/26/2011 [facility] Patient Information 2014 [place], [place].

## 2014-09-25 DIAGNOSIS — I639 Cerebral infarction, unspecified: Secondary | ICD-10-CM

## 2014-09-25 HISTORY — DX: Cerebral infarction, unspecified: I63.9

## 2014-12-11 ENCOUNTER — Emergency Department (HOSPITAL_COMMUNITY): Payer: Medicare Other

## 2014-12-11 ENCOUNTER — Inpatient Hospital Stay (HOSPITAL_COMMUNITY): Payer: Medicare Other

## 2014-12-11 ENCOUNTER — Inpatient Hospital Stay (HOSPITAL_COMMUNITY)
Admission: EM | Admit: 2014-12-11 | Discharge: 2014-12-14 | DRG: 064 | Disposition: A | Discharge status: Skilled Nursing Facility | Payer: Medicare Other | Attending: Internal Medicine | Admitting: Internal Medicine

## 2014-12-11 ENCOUNTER — Encounter (HOSPITAL_COMMUNITY): Payer: Self-pay

## 2014-12-11 DIAGNOSIS — I6322 Cerebral infarction due to unspecified occlusion or stenosis of basilar arteries: Secondary | ICD-10-CM | POA: Diagnosis present

## 2014-12-11 DIAGNOSIS — G8929 Other chronic pain: Secondary | ICD-10-CM | POA: Diagnosis present

## 2014-12-11 DIAGNOSIS — J45909 Unspecified asthma, uncomplicated: Secondary | ICD-10-CM | POA: Diagnosis present

## 2014-12-11 DIAGNOSIS — Z8673 Personal history of transient ischemic attack (TIA), and cerebral infarction without residual deficits: Secondary | ICD-10-CM

## 2014-12-11 DIAGNOSIS — S065X0D Traumatic subdural hemorrhage without loss of consciousness, subsequent encounter: Secondary | ICD-10-CM | POA: Diagnosis not present

## 2014-12-11 DIAGNOSIS — E785 Hyperlipidemia, unspecified: Secondary | ICD-10-CM | POA: Diagnosis present

## 2014-12-11 DIAGNOSIS — N390 Urinary tract infection, site not specified: Secondary | ICD-10-CM | POA: Diagnosis present

## 2014-12-11 DIAGNOSIS — I481 Persistent atrial fibrillation: Secondary | ICD-10-CM | POA: Diagnosis not present

## 2014-12-11 DIAGNOSIS — Z885 Allergy status to narcotic agent status: Secondary | ICD-10-CM | POA: Diagnosis not present

## 2014-12-11 DIAGNOSIS — M1990 Unspecified osteoarthritis, unspecified site: Secondary | ICD-10-CM | POA: Diagnosis present

## 2014-12-11 DIAGNOSIS — I482 Chronic atrial fibrillation: Secondary | ICD-10-CM | POA: Diagnosis present

## 2014-12-11 DIAGNOSIS — Z87820 Personal history of traumatic brain injury: Secondary | ICD-10-CM

## 2014-12-11 DIAGNOSIS — Z887 Allergy status to serum and vaccine status: Secondary | ICD-10-CM

## 2014-12-11 DIAGNOSIS — I10 Essential (primary) hypertension: Secondary | ICD-10-CM | POA: Diagnosis present

## 2014-12-11 DIAGNOSIS — S066X0D Traumatic subarachnoid hemorrhage without loss of consciousness, subsequent encounter: Secondary | ICD-10-CM

## 2014-12-11 DIAGNOSIS — M549 Dorsalgia, unspecified: Secondary | ICD-10-CM | POA: Diagnosis present

## 2014-12-11 DIAGNOSIS — W19XXXD Unspecified fall, subsequent encounter: Secondary | ICD-10-CM | POA: Diagnosis present

## 2014-12-11 DIAGNOSIS — R319 Hematuria, unspecified: Secondary | ICD-10-CM | POA: Diagnosis present

## 2014-12-11 DIAGNOSIS — Z89022 Acquired absence of left finger(s): Secondary | ICD-10-CM

## 2014-12-11 DIAGNOSIS — Z79899 Other long term (current) drug therapy: Secondary | ICD-10-CM

## 2014-12-11 DIAGNOSIS — Z7901 Long term (current) use of anticoagulants: Secondary | ICD-10-CM

## 2014-12-11 DIAGNOSIS — G8194 Hemiplegia, unspecified affecting left nondominant side: Secondary | ICD-10-CM | POA: Diagnosis present

## 2014-12-11 DIAGNOSIS — I639 Cerebral infarction, unspecified: Secondary | ICD-10-CM | POA: Insufficient documentation

## 2014-12-11 DIAGNOSIS — R29810 Facial weakness: Secondary | ICD-10-CM | POA: Diagnosis present

## 2014-12-11 DIAGNOSIS — G9340 Encephalopathy, unspecified: Secondary | ICD-10-CM | POA: Diagnosis present

## 2014-12-11 DIAGNOSIS — Z880 Allergy status to penicillin: Secondary | ICD-10-CM

## 2014-12-11 DIAGNOSIS — Z87891 Personal history of nicotine dependence: Secondary | ICD-10-CM | POA: Diagnosis not present

## 2014-12-11 DIAGNOSIS — E876 Hypokalemia: Secondary | ICD-10-CM | POA: Diagnosis not present

## 2014-12-11 DIAGNOSIS — I6789 Other cerebrovascular disease: Secondary | ICD-10-CM | POA: Diagnosis not present

## 2014-12-11 DIAGNOSIS — S066X9A Traumatic subarachnoid hemorrhage with loss of consciousness of unspecified duration, initial encounter: Secondary | ICD-10-CM | POA: Diagnosis present

## 2014-12-11 DIAGNOSIS — I4891 Unspecified atrial fibrillation: Secondary | ICD-10-CM

## 2014-12-11 DIAGNOSIS — S066XAA Traumatic subarachnoid hemorrhage with loss of consciousness status unknown, initial encounter: Secondary | ICD-10-CM | POA: Diagnosis present

## 2014-12-11 DIAGNOSIS — R4182 Altered mental status, unspecified: Secondary | ICD-10-CM

## 2014-12-11 HISTORY — DX: Unspecified atrial fibrillation: I48.91

## 2014-12-11 HISTORY — DX: Dorsalgia, unspecified: M54.9

## 2014-12-11 LAB — COMPREHENSIVE METABOLIC PANEL
ALBUMIN: 3.5 g/dL (ref 3.5–5.2)
ALT: 9 U/L (ref 0–53)
AST: 17 U/L (ref 0–37)
Alkaline Phosphatase: 59 U/L (ref 39–117)
Anion gap: 11 (ref 5–15)
BUN: 18 mg/dL (ref 6–23)
CO2: 24 mmol/L (ref 19–32)
Calcium: 9.2 mg/dL (ref 8.4–10.5)
Chloride: 104 mmol/L (ref 96–112)
Creatinine, Ser: 1.39 mg/dL — ABNORMAL HIGH (ref 0.50–1.35)
GFR, EST AFRICAN AMERICAN: 51 mL/min — AB (ref >90)
GFR, EST NON AFRICAN AMERICAN: 44 mL/min — AB (ref >90)
Glucose, Bld: 149 mg/dL — ABNORMAL HIGH (ref 70–99)
POTASSIUM: 4 mmol/L (ref 3.5–5.1)
Sodium: 139 mmol/L (ref 135–145)
Total Bilirubin: 1.5 mg/dL — ABNORMAL HIGH (ref 0.3–1.2)
Total Protein: 6.1 g/dL (ref 6.0–8.3)

## 2014-12-11 LAB — AMMONIA: AMMONIA: 11 umol/L (ref 11–32)

## 2014-12-11 LAB — CBC
HCT: 39.1 % (ref 39.0–52.0)
Hemoglobin: 13.3 g/dL (ref 13.0–17.0)
MCH: 29.2 pg (ref 26.0–34.0)
MCHC: 34 g/dL (ref 30.0–36.0)
MCV: 85.7 fL (ref 78.0–100.0)
Platelets: 120 10*3/uL — ABNORMAL LOW (ref 150–400)
RBC: 4.56 MIL/uL (ref 4.22–5.81)
RDW: 13.9 % (ref 11.5–15.5)
WBC: 11.1 10*3/uL — AB (ref 4.0–10.5)

## 2014-12-11 LAB — DIFFERENTIAL
BASOS PCT: 1 % (ref 0–1)
Basophils Absolute: 0.1 10*3/uL (ref 0.0–0.1)
Eosinophils Absolute: 0.1 10*3/uL (ref 0.0–0.7)
Eosinophils Relative: 1 % (ref 0–5)
LYMPHS ABS: 1.6 10*3/uL (ref 0.7–4.0)
Lymphocytes Relative: 14 % (ref 12–46)
MONO ABS: 1 10*3/uL (ref 0.1–1.0)
MONOS PCT: 9 % (ref 3–12)
NEUTROS PCT: 75 % (ref 43–77)
Neutro Abs: 8.4 10*3/uL — ABNORMAL HIGH (ref 1.7–7.7)

## 2014-12-11 LAB — RAPID URINE DRUG SCREEN, HOSP PERFORMED
Amphetamines: NOT DETECTED
BENZODIAZEPINES: NOT DETECTED
Barbiturates: NOT DETECTED
COCAINE: NOT DETECTED
Opiates: NOT DETECTED
TETRAHYDROCANNABINOL: NOT DETECTED

## 2014-12-11 LAB — I-STAT CHEM 8, ED
BUN: 21 mg/dL (ref 6–23)
CHLORIDE: 102 mmol/L (ref 96–112)
CREATININE: 1.2 mg/dL (ref 0.50–1.35)
Calcium, Ion: 1.13 mmol/L (ref 1.13–1.30)
Glucose, Bld: 155 mg/dL — ABNORMAL HIGH (ref 70–99)
HCT: 44 % (ref 39.0–52.0)
Hemoglobin: 15 g/dL (ref 13.0–17.0)
POTASSIUM: 3.9 mmol/L (ref 3.5–5.1)
SODIUM: 139 mmol/L (ref 135–145)
TCO2: 21 mmol/L (ref 0–100)

## 2014-12-11 LAB — URINE MICROSCOPIC-ADD ON

## 2014-12-11 LAB — URINALYSIS, ROUTINE W REFLEX MICROSCOPIC
Glucose, UA: NEGATIVE mg/dL
Ketones, ur: 40 mg/dL — AB
NITRITE: NEGATIVE
Protein, ur: 100 mg/dL — AB
Specific Gravity, Urine: 1.018 (ref 1.005–1.030)
UROBILINOGEN UA: 1 mg/dL (ref 0.0–1.0)
pH: 6 (ref 5.0–8.0)

## 2014-12-11 LAB — ETHANOL

## 2014-12-11 LAB — TSH: TSH: 2.243 u[IU]/mL (ref 0.350–4.500)

## 2014-12-11 LAB — I-STAT TROPONIN, ED: Troponin i, poc: 0 ng/mL (ref 0.00–0.08)

## 2014-12-11 LAB — PROTIME-INR
INR: 2.13 — ABNORMAL HIGH (ref 0.00–1.49)
Prothrombin Time: 24 seconds — ABNORMAL HIGH (ref 11.6–15.2)

## 2014-12-11 LAB — APTT: APTT: 67 s — AB (ref 24–37)

## 2014-12-11 MED ORDER — SODIUM CHLORIDE 0.9 % IV SOLN
INTRAVENOUS | Status: DC
  Administered 2014-12-11 – 2014-12-13 (×2): via INTRAVENOUS

## 2014-12-11 MED ORDER — ATORVASTATIN CALCIUM 40 MG PO TABS
40.0000 mg | ORAL_TABLET | Freq: Every day | ORAL | Status: DC
  Administered 2014-12-12 – 2014-12-13 (×2): 40 mg via ORAL
  Filled 2014-12-11 (×2): qty 1

## 2014-12-11 MED ORDER — STROKE: EARLY STAGES OF RECOVERY BOOK
Freq: Once | Status: AC
  Administered 2014-12-11: 22:00:00

## 2014-12-11 MED ORDER — METOPROLOL TARTRATE 50 MG PO TABS
50.0000 mg | ORAL_TABLET | Freq: Every day | ORAL | Status: DC
  Administered 2014-12-12 – 2014-12-13 (×2): 50 mg via ORAL
  Filled 2014-12-11 (×2): qty 1

## 2014-12-11 MED ORDER — SENNOSIDES-DOCUSATE SODIUM 8.6-50 MG PO TABS: 1.0000 | ORAL_TABLET | Freq: Every evening | ORAL | Status: DC | PRN

## 2014-12-11 MED ORDER — SODIUM CHLORIDE 0.9 % IV BOLUS (SEPSIS)
1000.0000 mL | Freq: Once | INTRAVENOUS | Status: AC
  Administered 2014-12-11: 1000 mL via INTRAVENOUS

## 2014-12-11 MED ORDER — DABIGATRAN ETEXILATE MESYLATE 150 MG PO CAPS
150.0000 mg | ORAL_CAPSULE | Freq: Two times a day (BID) | ORAL | Status: DC
  Administered 2014-12-12: 150 mg via ORAL
  Filled 2014-12-11 (×3): qty 1

## 2014-12-11 MED ORDER — TRAMADOL HCL 50 MG PO TABS: 50.0000 mg | ORAL_TABLET | Freq: Four times a day (QID) | ORAL | Status: DC | PRN

## 2014-12-11 MED ORDER — SODIUM CHLORIDE 0.9 % IV SOLN: INTRAVENOUS | Status: AC

## 2014-12-11 NOTE — ED Provider Notes (Signed)
CSN: [id number]     Arrival date & time 12/11/14  1632 History   First MD Initiated Contact with Patient 12/11/14 1635     Chief Complaint  Patient presents with  . Altered Mental Status     (Consider location/radiation/quality/duration/timing/severity/associated sxs/prior Treatment) The history is provided by the patient, the EMS personnel and the nursing home.  ALLEX LAPOINT is a 81 y.o. male hx HTN, stroke, pneumonia here presenting with altered mental status. He had been confused for the last several days as per the [facility]. He had some gross hematuria today so went to see primary care doctor. As per the nurse there, the primary doctor referred him to urology. He went back to the retirement center and around 3:20 PM, only became unresponsive and was unable to talk. He was also noted to be drooling. EMS was called and noticed that patient was disoriented. CBG 136. Patient said that he felt weak. Denies any fevers or chills or cough. Patient is on Pradaxa.    Past Medical History  Diagnosis Date  . Hypertension   . Arthritis   . Hernia   . Dysrhythmia     atrial fibrillation  . Head injury   . Humerus fracture     right  . Asthma     as a child  . Pneumonia   . Stroke     "light stroke"  . H/O hiatal hernia    Past Surgical History  Procedure Laterality Date  . Finger amputation Left     little  . Orif humerus fracture Right 09/15/2013    Procedure: OPEN REDUCTION INTERNAL FIXATION (ORIF) RIGHT HUMERAL SHAFT FRACTURE;  Surgeon: Cheral Almas, MD;  Location: [facility] OR;  Service: Orthopedics;  Laterality: Right;   History reviewed. No pertinent family history. History  Substance Use Topics  . Smoking status: Never Smoker   . Smokeless tobacco: Former Neurosurgeon    Types: Chew  . Alcohol Use: Yes     Comment: heavy drinker    Review of Systems  Unable to perform ROS: Mental status change      Allergies  Hydrocodone; Penicillins; and Tetanus toxoids  Home  Medications   Prior to Admission medications   Medication Sig Start Date End Date Taking? Authorizing Provider  amLODipine (NORVASC) 10 MG tablet Take 10 mg by mouth daily.    Historical Provider, MD  dabigatran (PRADAXA) 150 MG CAPS capsule Take 150 mg by mouth 2 (two) times daily.    Historical Provider, MD  doxazosin (CARDURA) 8 MG tablet Take 8 mg by mouth at bedtime.    Historical Provider, MD  HYDROcodone-acetaminophen (NORCO/VICODIN) 5-325 MG per tablet Take 1 tablet by mouth every 6 (six) hours as needed for moderate pain.    Historical Provider, MD  metoprolol (LOPRESSOR) 50 MG tablet Take 50 mg by mouth 2 (two) times daily.     Historical Provider, MD  traMADol (ULTRAM) 50 MG tablet Take 1 tablet (50 mg total) by mouth every 6 (six) hours as needed. 01/25/14   Quita Skye, MD   BP 136/66 mmHg  Pulse 71  Resp 16  SpO2 95% Physical Exam  Constitutional:  Chronically ill   HENT:  Head: Normocephalic.  Mouth/Throat: Oropharynx is clear and moist.  Eyes: Conjunctivae are normal. Pupils are equal, round, and reactive to light.  Neck: Normal range of motion. Neck supple.  Cardiovascular: Normal rate, regular rhythm and normal heart sounds.   Pulmonary/Chest: Effort normal and breath sounds normal. No  respiratory distress. He has no wheezes. He has no rales.  Abdominal: Soft. Bowel sounds are normal. He exhibits no distension. There is no tenderness. There is no rebound and no guarding.  Musculoskeletal: Normal range of motion. He exhibits no edema or tenderness.  Neurological:  Confused. L facial droop. Strength 4/5 L side, 5/5 R side.   Skin: Skin is warm and dry.  Psychiatric: He has a normal mood and affect. His behavior is normal. Judgment and thought content normal.  Nursing note and vitals reviewed.   ED Course  Procedures (including critical care time) Labs Review Labs Reviewed  PROTIME-INR - Abnormal; Notable for the following:    Prothrombin Time 24.0 (*)    INR  2.13 (*)    All other components within normal limits  APTT - Abnormal; Notable for the following:    aPTT 67 (*)    All other components within normal limits  CBC - Abnormal; Notable for the following:    WBC 11.1 (*)    Platelets 120 (*)    All other components within normal limits  DIFFERENTIAL - Abnormal; Notable for the following:    Neutro Abs 8.4 (*)    All other components within normal limits  I-STAT CHEM 8, ED - Abnormal; Notable for the following:    Glucose, Bld 155 (*)    All other components within normal limits  ETHANOL  COMPREHENSIVE METABOLIC PANEL  URINE RAPID DRUG SCREEN (HOSP PERFORMED)  URINALYSIS, ROUTINE W REFLEX MICROSCOPIC  I-STAT TROPOININ, ED  I-STAT TROPOININ, ED    Imaging Review Ct Head Wo Contrast  12/11/2014   CLINICAL DATA:  Found down and unresponsive.  Altered mental status.  EXAM: CT HEAD WITHOUT CONTRAST  TECHNIQUE: Contiguous axial images were obtained from the base of the skull through the vertex without contrast.  COMPARISON:  01/25/2014  FINDINGS: There is chronic encephalomalacia in the right temporal lobe compatible with an old insult. There is new low-density throughout the upper right basal ganglia extending into the right corona radiata. Again noted is diffuse low density throughout the white matter. Stable cerebral atrophy. No evidence for acute hemorrhage, mass lesion, midline shift or hydrocephalus. The paranasal sinuses are clear. No acute bone abnormality.  IMPRESSION: Negative for acute hemorrhage.  New low density in the upper right basal ganglia and extending into the right corona radiata. Findings are compatible with an infarct of uncertain age. The infarct could be acute or subacute.  Cerebral atrophy with evidence of chronic small vessel ischemic changes.  Old insult in the right temporal lobe.   Electronically Signed   By: Richarda OverlieAdam  Henn M.D.   On: 12/11/2014 17:53   Dg Chest Port 1 View  12/11/2014   CLINICAL DATA:  AMS. ?low grade  fever. No n/v. No apparent resp distress. No cough. No chest pain. Hx light stroke. Ex smoker. No copd  EXAM: PORTABLE CHEST - 1 VIEW  COMPARISON:  10/03/2013  FINDINGS: Cardiac silhouette is mildly enlarged. Normal mediastinal and hilar contours.  Lungs are mildly hyperexpanded. Small stable granuloma in the left upper lobe. Lungs otherwise clear. No pleural effusion or pneumothorax.  There stable changes from the ORIF of a proximal right humerus fracture. Bony thorax is demineralized.  IMPRESSION: No acute cardiopulmonary disease.   Electronically Signed   By: Amie Portlandavid  Ormond M.D.   On: 12/11/2014 17:14     EKG Interpretation   Date/Time:  Friday December 11 2014 16:45:49 EDT Ventricular Rate:  67 PR Interval:  QRS Duration: 90 QT Interval:  411 QTC Calculation: 434 R Axis:   44 Text Interpretation:  Atrial fibrillation Borderline low voltage,  extremity leads No significant change since last tracing Confirmed by YAO   MD, DAVID ([id number]) on 12/11/2014 4:50:16 PM      MDM   Final diagnoses:  Altered mental state    RAJAN BURGARD is a 81 y.o. male here with AMS, L sided weakness. Within window for TPA. However, patient on pradaxa. I consulted neurology, who doesn't feel that he is TPA candidate so request that I don't activate code stroke. Will do stroke workup and AMS workup.   6:04 PM Dr. Hosie Poisson neurology at bedside. CT showed large R basal ganglia infarct, likely contributing to his symptoms. Not candidate for TPA. Will admit for stroke workup.   Richardean Canal, MD 12/11/14 [phone]

## 2014-12-11 NOTE — Progress Notes (Signed)
Pt transferred to unit from ED via NT x 1. Pt responds to voice and oriented upon arrival. No complaints of pain or discomfort. No signs or symptoms of acute distress. Pt connected to telemetry and central monitoring notified. Pt oriented to unit, as well as unit procedures. Pt now resting in bed at lowest position, bed alarm on, call light in reach. Will continue to monitor. Delfino Lovettichie Natosha Bou, RN, BSN 12/11/2014 7:50 PM

## 2014-12-11 NOTE — Consult Note (Addendum)
Stroke Consult    Chief Complaint: confusion and left sided weakness  HPI: Jerry [name] is an 81 y.o. male hx HTN, stroke, A fib on pradaxa presenting with altered mental status. He had been confused for the last several days as per the [facility]. This afternoon became more unresponsive and unable to talk. Noted to be drooling with question of left facial droop and left sided weakness  Patient unable to provide further history due to altered mental status.   CT head imaging reviewed, shows right basal ganglia infarct which appears new compared to prior head CT.   Date last known well: 12/08/2014 Time last known well: unclear time tPA Given: no, last known normal 3 days ago  Past Medical History  Diagnosis Date  . Hypertension   . Arthritis   . Hernia   . Dysrhythmia     atrial fibrillation  . Head injury   . Humerus fracture     right  . Asthma     as a child  . Pneumonia   . Stroke     "light stroke"  . H/O hiatal hernia     Past Surgical History  Procedure Laterality Date  . Finger amputation Left     little  . Orif humerus fracture Right 09/15/2013    Procedure: OPEN REDUCTION INTERNAL FIXATION (ORIF) RIGHT HUMERAL SHAFT FRACTURE;  Surgeon: Cheral AlmasNaiping Michael Xu, MD;  Location: [facility] OR;  Service: Orthopedics;  Laterality: Right;    History reviewed. No pertinent family history. Social History:  reports that he has never smoked. He has quit using smokeless tobacco. His smokeless tobacco use included Chew. He reports that he drinks alcohol. His drug history is not on file.  Allergies:  Allergies  Allergen Reactions  . Hydrocodone Other (See Comments)    Reaction unknown  . Penicillins Other (See Comments)    REACTION: unknown  . Tetanus Toxoids Other (See Comments)    unknown     (Not in a hospital admission)  ROS: Out of a complete 14 system review, the patient complains of only the following symptoms, and all other reviewed systems are  negative. +confusion  Physical Examination: Filed Vitals:   12/11/14 1635  BP: 136/66  Pulse: 71  Resp: 16   Physical Exam  Constitutional: mildly cachectic Psych: Affect appropriate to situation Eyes: No scleral injection HENT: No OP obstrucion Head: Normocephalic.  Cardiovascular: irregular Respiratory: Effort normal and breath sounds normal.  GI: Soft. Bowel sounds are normal. No distension. There is no tenderness.  Skin: WDI  Neurologic Examination: Mental Status: Lethargic but easily aroused to voice, oriented to name only. Intermittently will follow commands. Mild dysarthria noted Cranial Nerves: II: unable to visualize optic discs bilaterlly, visual fields grossly normal, pupils equal, round, reactive to light  III,IV, VI: ptosis not present, extra-ocular motions intact bilaterally V,VII: mild flattening left NLF with minor asymmetry with activation, facial light touch sensation normal bilaterally VIII: hearing normal bilaterally IX,X: gag reflex present XI: trapezius strength/neck flexion strength normal bilaterally XII: tongue strength normal  Motor: Difficult to fully assess due to mental status. Able to hold both UE against gravity, question subtle weakness on * side Bilateral LE unable to hold against gravity, question effort due to lethargy Sensory: withdrawals to noxious in all extremities though slightly more brisk on right side Deep Tendon Reflexes: 1+ and symmetric throughout Plantars: Right: downgoing   Left: downgoing Cerebellar: Unable to test Gait: unable to test  Laboratory Studies:   Basic Metabolic  Panel:  Recent Labs Lab 12/11/14 1711  NA 139  K 3.9  CL 102  GLUCOSE 155*  BUN 21  CREATININE 1.20    Liver Function Tests: No results for input(s): AST, ALT, ALKPHOS, BILITOT, PROT, ALBUMIN in the last 168 hours. No results for input(s): LIPASE, AMYLASE in the last 168 hours. No results for input(s): AMMONIA in the last 168  hours.  CBC:  Recent Labs Lab 12/11/14 1711  HGB 15.0  HCT 44.0    Cardiac Enzymes: No results for input(s): CKTOTAL, CKMB, CKMBINDEX, TROPONINI in the last 168 hours.  BNP: Invalid input(s): POCBNP  CBG: No results for input(s): GLUCAP in the last 168 hours.  Microbiology: Results for orders placed or performed during the hospital encounter of 02/19/13  MRSA PCR Screening     Status: None   Collection Time: 02/19/13  2:35 PM  Result Value Ref Range Status   MRSA by PCR NEGATIVE NEGATIVE Final    Comment:        The GeneXpert MRSA Assay (FDA approved for NASAL specimens only), is one component of a comprehensive MRSA colonization surveillance program. It is not intended to diagnose MRSA infection nor to guide or monitor treatment for MRSA infections.  Urine culture     Status: None   Collection Time: 02/21/13  9:46 AM  Result Value Ref Range Status   Specimen Description URINE, CATHETERIZED  Final   Special Requests NONE  Final   Culture  Setup Time 02/21/2013 12:05  Final   Colony Count >=100,000 COLONIES/ML  Final   Culture ESCHERICHIA COLI  Final   Report Status 02/23/2013 FINAL  Final   Organism ID, Bacteria ESCHERICHIA COLI  Final      Susceptibility   Escherichia coli - MIC*    AMPICILLIN <=2 SENSITIVE Sensitive     CEFAZOLIN <=4 SENSITIVE Sensitive     CEFTRIAXONE <=1 SENSITIVE Sensitive     CIPROFLOXACIN <=0.25 SENSITIVE Sensitive     GENTAMICIN <=1 SENSITIVE Sensitive     LEVOFLOXACIN <=0.12 SENSITIVE Sensitive     NITROFURANTOIN 32 SENSITIVE Sensitive     TOBRAMYCIN <=1 SENSITIVE Sensitive     TRIMETH/SULFA <=20 SENSITIVE Sensitive     PIP/TAZO <=4 SENSITIVE Sensitive     * ESCHERICHIA COLI    Coagulation Studies: No results for input(s): LABPROT, INR in the last 72 hours.  Urinalysis: No results for input(s): COLORURINE, LABSPEC, PHURINE, GLUCOSEU, HGBUR, BILIRUBINUR, KETONESUR, PROTEINUR, UROBILINOGEN, NITRITE, LEUKOCYTESUR in the last 168  hours.  Invalid input(s): APPERANCEUR  Lipid Panel:     Component Value Date/Time   CHOL  06/14/2007 1520    105        ATP III CLASSIFICATION:  <200     mg/dL   Desirable  161-096  mg/dL   Borderline High  >=045    mg/dL   High   TRIG 409 81/19/1478 1520   HDL 24* 06/14/2007 1520   CHOLHDL 4.4 06/14/2007 1520   VLDL 27 06/14/2007 1520   LDLCALC  06/14/2007 1520    54        Total Cholesterol/HDL:CHD Risk Coronary Heart Disease Risk Table                     Men   Women  1/2 Average Risk   3.4   3.3    HgbA1C:  Lab Results  Component Value Date   HGBA1C  06/14/2007    5.9 (NOTE)   The ADA recommends the following  therapeutic goals for glycemic   control related to Hgb A1C measurement:   Goal of Therapy:   < 7.0% Hgb A1C   Action Suggested:  > 8.0% Hgb A1C   Ref:  Diabetes Care, 22, Suppl. 1, 1999    Urine Drug Screen:     Component Value Date/Time   LABOPIA NONE DETECTED 06/14/2007 1500   COCAINSCRNUR NONE DETECTED 06/14/2007 1500   LABBENZ NONE DETECTED 06/14/2007 1500   AMPHETMU NONE DETECTED 06/14/2007 1500   THCU NONE DETECTED 06/14/2007 1500   LABBARB  06/14/2007 1500    NONE DETECTED        DRUG SCREEN FOR MEDICAL PURPOSES ONLY.  IF CONFIRMATION IS NEEDED FOR ANY PURPOSE, NOTIFY LAB WITHIN 5 DAYS.    Alcohol Level: No results for input(s): ETH in the last 168 hours.  Other results: EKG: atrial fibrillation.  Imaging: Dg Chest Port 1 View  12/11/2014   CLINICAL DATA:  AMS. ?low grade fever. No n/v. No apparent resp distress. No cough. No chest pain. Hx light stroke. Ex smoker. No copd  EXAM: PORTABLE CHEST - 1 VIEW  COMPARISON:  10/03/2013  FINDINGS: Cardiac silhouette is mildly enlarged. Normal mediastinal and hilar contours.  Lungs are mildly hyperexpanded. Small stable granuloma in the left upper lobe. Lungs otherwise clear. No pleural effusion or pneumothorax.  There stable changes from the ORIF of a proximal right humerus fracture. Bony thorax is  demineralized.  IMPRESSION: No acute cardiopulmonary disease.   Electronically Signed   By: Amie Portland M.D.   On: 12/11/2014 17:14    Assessment: 81 y.o. male admitted with confusion and reported new onset weakness on the left side. CT head shows acute vs subacute infarct in the right basal ganglia/corona radiata. Will admit for stroke workup Will need to rule out metabolic and/or infectious etiology of mental status change.   Stroke Risk Factors -HTN, A fib, prior CVA  Plan: 1. HgbA1c, fasting lipid panel 2. MRI, MRA  of the brain without contrast 3. PT consult, OT consult, Speech consult 4. Echocardiogram 5. Carotid dopplers 6. Prophylactic therapy-continue pradaxa 7. Risk factor modification 8. Telemetry monitoring 9. Frequent neuro checks 10. NPO until RN stroke swallow screen 11. Check UA, CBC, B12, TSH, ammonia   Elspeth Cho, DO [facility] [phone]  If 7pm- 7am, please page neurology on call as listed in AMION. 12/11/2014, 5:28 PM

## 2014-12-11 NOTE — H&P (Signed)
[facility] History and Physical  Jerry [name] ZOX:[id number] DOB: 1923/12/23 DOA: 12/11/2014  Referring physician: ED physician PCP: [name], MD  Specialists:   Chief Complaint: AMS and left sided weakness   HPI: Jerry [name] is a 81 y.o. male with PMH of  HTN, stroke, A fib on pradaxa, asthma, hiatal hernia, history of subarachnoid hemorrhage, who presents with altered mental status and left side weakness.   [facility]. History is from ED report and EMS. It seems that he had been confused for the last several days as per the [facility] center. He had some gross hematuria today so went to see primary care doctor. As per the nurse there, the primary doctor referred him to urology. At about 3:20 PM, he became unresponsive and was unable to talk. He was noted to be drooling with question of left facial droop and left sided weakness. Patient does not seem to have fever, chills, chest pain, shortness of breath, cough, abdominal pain, leg edema, rashes.  In ED, patient was found to have right basal ganglia infarct by CT-head, which appears new compared to prior head CT. WBC 11.1, troponin negative, and they are 2.13, electrolytes okay. Patient is admitted to inpatient for further evaluation and treatment. Neurology was consulted.  Review of Systems: As presented in the history of presenting illness, rest negative.  [facility] Can patient participate in ADLs? none  Allergy:  Allergies  Allergen Reactions  . Hydrocodone Other (See Comments)    Reaction unknown  . Penicillins Other (See Comments)    REACTION: unknown  . Tetanus Toxoids Other (See Comments)    unknown    Past Medical History  Diagnosis Date  . Hypertension   . Arthritis   . Hernia   . Dysrhythmia     atrial fibrillation  . Head injury   . Humerus fracture     right  . Asthma     as a child  . Pneumonia   . Stroke     "light  stroke"  . H/O hiatal hernia   . Stroke   . A-fib   . Back pain     Past Surgical History  Procedure Laterality Date  . Finger amputation Left     little  . Orif humerus fracture Right 09/15/2013    Procedure: OPEN REDUCTION INTERNAL FIXATION (ORIF) RIGHT HUMERAL SHAFT FRACTURE;  Surgeon: Cheral Almas, MD;  Location: [facility] OR;  Service: Orthopedics;  Laterality: Right;    Social History:  reports that he has never smoked. He has quit using smokeless tobacco. His smokeless tobacco use included Chew. He reports that he drinks alcohol. His drug history is not on file.  Family History:  Family History  Problem Relation Age of Onset  . Heart attack Brother      Prior to Admission medications   Medication Sig Start Date End Date Taking? Authorizing Provider  amLODipine (NORVASC) 10 MG tablet Take 10 mg by mouth daily.    Historical Provider, MD  dabigatran (PRADAXA) 150 MG CAPS capsule Take 150 mg by mouth 2 (two) times daily.    Historical Provider, MD  doxazosin (CARDURA) 8 MG tablet Take 8 mg by mouth at bedtime.    Historical Provider, MD  HYDROcodone-acetaminophen (NORCO/VICODIN) 5-325 MG per tablet Take 1 tablet by mouth every 6 (six) hours as needed for moderate pain.    Historical Provider, MD  metoprolol (LOPRESSOR) 50 MG tablet Take  50 mg by mouth 2 (two) times daily.     Historical Provider, MD  traMADol (ULTRAM) 50 MG tablet Take 1 tablet (50 mg total) by mouth every 6 (six) hours as needed. 01/25/14   Quita Skye, MD    Physical Exam: Daiva Eves:   12/11/14 1855 12/11/14 1930 12/11/14 2130 12/11/14 2330  BP:  121/69 148/78 143/62  Pulse:  78 75 80  Temp: 97 F (36.1 C) 97.2 F (36.2 C) 97 F (36.1 C) 97.7 F (36.5 C)  TempSrc: Rectal Axillary Axillary Oral  Resp:  SpO2:  94% 98% 96%   General: Not in acute distress HEENT:       Eyes: PERRL, EOMI, no scleral icterus       ENT: No discharge from the ears and nose, no pharynx injection, no tonsillar  enlargement.        Neck: No JVD, no bruit, no mass felt. Cardiac: S1/S2, RRR, No murmurs, No gallops or rubs Pulm: Good air movement bilaterally. Clear to auscultation bilaterally. No rales, wheezing, rhonchi or rubs. Abd: Soft, nondistended, nontender, no rebound pain, no organomegaly, BS present Ext: No edema bilaterally. 2+DP/PT pulse bilaterally Musculoskeletal: No joint deformities, erythema, or stiffness, ROM full Skin: No rashes.  Neuro: drowsy, and oriented to person, but not to time and place, cranial nerves II-XII grossly intact, muscle strength 3/5 in left arm and 4/5 in right leg. sensation to light touch intact. Brachial reflex 1+ bilaterally. Knee reflex 1+ bilaterally. Negative Babinski's sign.  Psych: Patient is not psychotic, no suicidal or hemocidal ideation.  Labs on Admission:  Basic Metabolic Panel:  Recent Labs Lab 12/11/14 1654 12/11/14 1711  NA 139 139  K 4.0 3.9  CL 104 102  CO2 24  --   GLUCOSE 149* 155*  BUN 18 21  CREATININE 1.39* 1.20  CALCIUM 9.2  --    Liver Function Tests:  Recent Labs Lab 12/11/14 1654  AST 17  ALT 9  ALKPHOS 59  BILITOT 1.5*  PROT 6.1  ALBUMIN 3.5   No results for input(s): LIPASE, AMYLASE in the last 168 hours.  Recent Labs Lab 12/11/14 2022  AMMONIA 11   CBC:  Recent Labs Lab 12/11/14 1654 12/11/14 1711  WBC 11.1*  --   NEUTROABS 8.4*  --   HGB 13.3 15.0  HCT 39.1 44.0  MCV 85.7  --   PLT 120*  --    Cardiac Enzymes: No results for input(s): CKTOTAL, CKMB, CKMBINDEX, TROPONINI in the last 168 hours.  BNP (last 3 results) No results for input(s): BNP in the last 8760 hours.  ProBNP (last 3 results) No results for input(s): PROBNP in the last 8760 hours.  CBG: No results for input(s): GLUCAP in the last 168 hours.  Radiological Exams on Admission: Ct Head Wo Contrast  12/11/2014   CLINICAL DATA:  Found down and unresponsive.  Altered mental status.  EXAM: CT HEAD WITHOUT CONTRAST  TECHNIQUE:  Contiguous axial images were obtained from the base of the skull through the vertex without contrast.  COMPARISON:  01/25/2014  FINDINGS: There is chronic encephalomalacia in the right temporal lobe compatible with an old insult. There is new low-density throughout the upper right basal ganglia extending into the right corona radiata. Again noted is diffuse low density throughout the white matter. Stable cerebral atrophy. No evidence for acute hemorrhage, mass lesion, midline shift or hydrocephalus. The paranasal sinuses are clear. No acute bone abnormality.  IMPRESSION: Negative for acute  hemorrhage.  New low density in the upper right basal ganglia and extending into the right corona radiata. Findings are compatible with an infarct of uncertain age. The infarct could be acute or subacute.  Cerebral atrophy with evidence of chronic small vessel ischemic changes.  Old insult in the right temporal lobe.   Electronically Signed   By: Richarda OverlieAdam  Henn M.D.   On: 12/11/2014 17:53   Dg Chest Port 1 View  12/11/2014   CLINICAL DATA:  AMS. ?low grade fever. No n/v. No apparent resp distress. No cough. No chest pain. Hx light stroke. Ex smoker. No copd  EXAM: PORTABLE CHEST - 1 VIEW  COMPARISON:  10/03/2013  FINDINGS: Cardiac silhouette is mildly enlarged. Normal mediastinal and hilar contours.  Lungs are mildly hyperexpanded. Small stable granuloma in the left upper lobe. Lungs otherwise clear. No pleural effusion or pneumothorax.  There stable changes from the ORIF of a proximal right humerus fracture. Bony thorax is demineralized.  IMPRESSION: No acute cardiopulmonary disease.   Electronically Signed   By: Amie Portlandavid  Ormond M.D.   On: 12/11/2014 17:14    EKG: Independently reviewed. A. fib  Assessment/Plan Principal Problem:   Stroke Active Problems:   Traumatic subarachnoid hemorrhage   Chronic back pain   Atrial fibrillation   Anticoagulated   HTN (hypertension)   Left-sided weakness   Acute encephalopathy    Hematuria   Stroke: CT head shows acute vs subacute infarct in the right basal ganglia/corona radiata. Neurology was consulted, Dr. Minus BreedingSumner's saw patient.  -Will admit to tele bed -Appreciate neurology's consultation, with follow-up recommendations as follows 1. HgbA1c, fasting lipid panel 2. MRI, MRA  of the brain without contrast 3. PT consult, OT consult, Speech consult 4. Echocardiogram 5. Carotid dopplers 6. Prophylactic therapy-continue pradaxa 7. Risk factor modification 8. Telemetry monitoring 9. Frequent neuro checks 10. NPO until RN stroke swallow screen 11. Check UA, CBC, B12, TSH, ammonia -will star lipitor now  Hematuria: Likely due to UTI. Urinalysis is positive for moderate leukocyte with many bacteria. -Start IV Levaquin -follow-up urine culture -per [facility], patient was given referral to urology, may follow-up with urology after discharge  Acute encephalopathy: Likely due to combination of UTI and stroke -treat underlying problems as above -Neuro check frequently -IVF: 75 cc/h NS  HTN: -Hold amlodipine in the setting of possible acute stroke -Continue metoprolol  A. Fib: CHA2DS2-VASc Score is 5, need oral anticoagulation. Patient is on Pradaxa at home. INR is 2.13 on admission. Heart rate is well controlled -will continue Pradaxa -Continue metoprolol    DVT ppx: on protection, SCD    Code Status: Full code (I discussed with one of his sons, who cannot make the decision, needs to discuss with other children of patient) Family Communication:   Yes, patient's    son   at bed side Disposition Plan: Admit to inpatient   Date of Service 12/12/2014    Lorretta HarpIU, [place] [place] [facility] Pager [phone]  If 7PM-7AM, please contact night-coverage [email or web address] Password [email or web address] 12/12/2014, 1:15 AM

## 2014-12-11 NOTE — ED Notes (Signed)
[facility] EMS[facility] Pt seen by MD today for blood in urine. Diagnosed with UTI. Was found at nurses station of [facility] unresponsive. Hx Afib. Pt responsive to voice at this time. Disoriented x 4. Vitals stable. CBG = 136

## 2014-12-12 DIAGNOSIS — E785 Hyperlipidemia, unspecified: Secondary | ICD-10-CM

## 2014-12-12 LAB — LIPID PANEL
CHOL/HDL RATIO: 6.1 ratio
CHOLESTEROL: 170 mg/dL (ref 0–200)
HDL: 28 mg/dL — ABNORMAL LOW (ref >39)
LDL CALC: 124 mg/dL — AB (ref 0–99)
TRIGLYCERIDES: 90 mg/dL (ref <150)
VLDL: 18 mg/dL (ref 0–40)

## 2014-12-12 LAB — VITAMIN B12: VITAMIN B 12: 292 pg/mL (ref 211–911)

## 2014-12-12 MED ORDER — LEVOFLOXACIN IN D5W 500 MG/100ML IV SOLN
500.0000 mg | Freq: Once | INTRAVENOUS | Status: AC
  Administered 2014-12-12: 500 mg via INTRAVENOUS
  Filled 2014-12-12: qty 100

## 2014-12-12 MED ORDER — APIXABAN 5 MG PO TABS
5.0000 mg | ORAL_TABLET | Freq: Two times a day (BID) | ORAL | Status: DC
  Administered 2014-12-12 – 2014-12-14 (×4): 5 mg via ORAL
  Filled 2014-12-12 (×5): qty 1

## 2014-12-12 MED ORDER — VITAMIN B-12 1000 MCG PO TABS
1000.0000 ug | ORAL_TABLET | Freq: Every day | ORAL | Status: DC
  Administered 2014-12-13 – 2014-12-14 (×2): 1000 ug via ORAL
  Filled 2014-12-12 (×2): qty 1

## 2014-12-12 MED ORDER — LEVOFLOXACIN IN D5W 500 MG/100ML IV SOLN
500.0000 mg | INTRAVENOUS | Status: DC
  Administered 2014-12-13: 500 mg via INTRAVENOUS
  Filled 2014-12-12: qty 100

## 2014-12-12 MED ORDER — CYANOCOBALAMIN 1000 MCG/ML IJ SOLN
1000.0000 ug | Freq: Once | INTRAMUSCULAR | Status: AC
  Administered 2014-12-12: 1000 ug via INTRAMUSCULAR
  Filled 2014-12-12: qty 1
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
## 2014-12-12 NOTE — Progress Notes (Signed)
[facility] PROGRESS NOTE  Jerry [name] ZOX:[id number] DOB: 1924-01-09 DOA: 12/11/2014 PCP: Jerry Mask, MD  Assessment/Plan: Principal Problem:   Stroke Active Problems:   Traumatic subarachnoid hemorrhage   Chronic back pain   Atrial fibrillation   Anticoagulated   HTN (hypertension)   Left-sided weakness   Acute encephalopathy   Hematuria    Stroke: Acute large RIGHT basal ganglia acute infarct corresponding to CT abnormality.At least mid grade stenosis the RIGHT V4 segment. Near complete loss of mid basilar flow related enhancement which may reflect high-grade stenosis and/or artifact  Cont  tele bed -Appreciate neurology's consultation, with follow-up recommendations as follows 1. HgbA1c, fasting lipid panel, LDL 124, start statin 3. PT consult, OT consult, Speech consult 4. Echocardiogram pending 5. Carotid dopplers pending  6. Prophylactic therapy-continue pradaxa, neurology to make further recs 10. NPO until RN stroke swallow screen 11. Check UA mild UTI  , CBC, B12, TSH, ammonia    Hematuria: Likely due to UTI. Urinalysis is positive for moderate leukocyte with many bacteria. -Start IV Levaquin -follow-up urine culture -[facility], patient was given referral to urology, may follow-up with urology after discharge  Acute encephalopathy: Likely due to combination of UTI and stroke -treat underlying problems as above -Neuro check frequently -IVF: 75 cc/h NS  HTN: -Hold amlodipine in the setting of possible acute stroke -Continue metoprolol  A. Fib: CHA2DS2-VASc Score is 5, need oral anticoagulation.  Patient is on Pradaxa at home. INR is 2.13 on admission. Heart rate is well controlled  continue Pradaxa for now  -Continue metoprolol    Code Status: full Family Communication: family updated about patient's clinical progress Disposition Plan:  As above    Brief narrative: Jerry [name] is a 81 y.o. male with PMH of HTN, stroke,  A fib on pradaxa, asthma, hiatal hernia, history of subarachnoid hemorrhage, who presents with altered mental status and left side weakness.   [facility] and [facility]. History is from ED report and EMS. It seems that he had been confused for the last several days as per the [facility]. He had some gross hematuria today so went to see primary care doctor. As per the nurse there, the primary doctor referred him to urology. At about 3:20 PM, he became unresponsive and was unable to talk. He was noted to be drooling with question of left facial droop and left sided weakness. Patient does not seem to have fever, chills, chest pain, shortness of breath, cough, abdominal pain, leg edema, rashes.  In ED, patient was found to have right basal ganglia infarct by CT-head, which appears new compared to prior head CT. WBC 11.1, troponin negative, and they are 2.13, electrolytes okay. Patient is admitted to inpatient for further evaluation and treatment. Neurology was consulted  Consultants:  neurology  Procedures:  None     Antibiotics: Anti-infectives:@  HPI/Subjective: Continues to have left sided weakness, no cp, no sob   Objective: Filed Vitals:   12/12/14 0330 12/12/14 0530 12/12/14 0730 12/12/14 1026  BP: 96/62 154/71 133/85 134/74  Pulse: 88 98 92 95  Temp: 97.5 F (36.4 C) 98.1 F (36.7 C) 98.4 F (36.9 C) 97.3 F (36.3 C)  TempSrc: Oral Oral Oral Oral  Resp: SpO2: 96% 95% 97% 97%    Intake/Output Summary (Last 24 hours) at 12/12/14 1248 Last data filed at 12/12/14 0750  Gross per 24 hour  Intake      0 ml  Output    100 ml  Net   -100 ml    Exam:  General: No acute respiratory distress Lungs: Clear to auscultation bilaterally without wheezes or crackles Cardiovascular: Regular rate and rhythm without murmur gallop or rub normal S1 and S2 Abdomen: Nontender, nondistended, soft, bowel sounds positive, no rebound, no ascites, no  appreciable mass Extremities: No significant cyanosis, clubbing, or edema bilateral lower extremities Neuro: drowsy, and oriented to person, but not to time and place, cranial nerves II-XII grossly intact, muscle strength 3/5 in left arm and 4/5 in right leg     Data Reviewed: Basic Metabolic Panel:  Recent Labs Lab 12/11/14 1654 12/11/14 1711  NA 139 139  K 4.0 3.9  CL 104 102  CO2 24  --   GLUCOSE 149* 155*  BUN 18 21  CREATININE 1.39* 1.20  CALCIUM 9.2  --     Liver Function Tests:  Recent Labs Lab 12/11/14 1654  AST 17  ALT 9  ALKPHOS 59  BILITOT 1.5*  PROT 6.1  ALBUMIN 3.5   No results for input(s): LIPASE, AMYLASE in the last 168 hours.  Recent Labs Lab 12/11/14 2022  AMMONIA 11    CBC:  Recent Labs Lab 12/11/14 1654 12/11/14 1711  WBC 11.1*  --   NEUTROABS 8.4*  --   HGB 13.3 15.0  HCT 39.1 44.0  MCV 85.7  --   PLT 120*  --     Cardiac Enzymes: No results for input(s): CKTOTAL, CKMB, CKMBINDEX, TROPONINI in the last 168 hours. BNP (last 3 results) No results for input(s): BNP in the last 8760 hours.  ProBNP (last 3 results) No results for input(s): PROBNP in the last 8760 hours.    CBG: No results for input(s): GLUCAP in the last 168 hours.  No results found for this or any previous visit (from the past 240 hour(s)).   Studies: Ct Head Wo Contrast  12/11/2014   CLINICAL DATA:  Found down and unresponsive.  Altered mental status.  EXAM: CT HEAD WITHOUT CONTRAST  TECHNIQUE: Contiguous axial images were obtained from the base of the skull through the vertex without contrast.  COMPARISON:  01/25/2014  FINDINGS: There is chronic encephalomalacia in the right temporal lobe compatible with an old insult. There is new low-density throughout the upper right basal ganglia extending into the right corona radiata. Again noted is diffuse low density throughout the white matter. Stable cerebral atrophy. No evidence for acute hemorrhage, mass lesion,  midline shift or hydrocephalus. The paranasal sinuses are clear. No acute bone abnormality.  IMPRESSION: Negative for acute hemorrhage.  New low density in the upper right basal ganglia and extending into the right corona radiata. Findings are compatible with an infarct of uncertain age. The infarct could be acute or subacute.  Cerebral atrophy with evidence of chronic small vessel ischemic changes.  Old insult in the right temporal lobe.   Electronically Signed   By: Richarda Overlie M.D.   On: 12/11/2014 17:53   Mr Brain Wo Contrast  12/12/2014   CLINICAL DATA:  Confusion new onset LEFT-sided weakness. CT demonstrates RIGHT basal ganglia age indeterminate infarct. History of hypertension, atrial fibrillation and stroke.  EXAM: MRI HEAD WITHOUT CONTRAST  MRA HEAD WITHOUT CONTRAST  TECHNIQUE: Multiplanar, multiecho pulse sequences of the brain and surrounding structures were obtained without intravenous contrast. Angiographic images of the head were obtained using MRA technique without contrast.  COMPARISON:  CT of the head December 11, 2014 at 1731 hours and  CT of the head Jan 25, 2014  FINDINGS: MRI HEAD FINDINGS  4.1 x 2.1 cm area of reduced diffusion within RIGHT basal ganglia, corresponding to CT abnormality. Associated low ADC values consistent with acute infarct. Extra-axial hemosiderin staining along the bilateral frontal lobes, no intraparenchymal suspicious susceptibility artifact.  Bilateral holo hemispheric subdural hematomas measure up to 4 mm on the RIGHT, considering bright T2 signal, these are chronic. RIGHT inferior temporal lobe encephalomalacia with mild ex vacuo dilatation RIGHT temporal horn. LEFT greater the RIGHT frontal lobe encephalomalacia. Moderate to severe white matter changes suggest chronic small vessel ischemic disease. No midline shift. No mass lesions.  Ocular globes and orbital contents are unremarkable. Paranasal sinus are well aerated. Trace bilateral mastoid effusions. No abnormal  sellar expansion. No cerebellar tonsillar ectopia. Generalized bright T1 bone marrow signal most consistent with osteopenia. Patient is edentulous.  MRA HEAD FINDINGS - moderately motion degraded examination.  Anterior circulation: Flow related enhancement of the cervical, petrous, cavernous and supra clinoid internal carotid arteries. Tiny anterior communicating artery present with normal flow related enhancement of the anterior and middle cerebral arteries proximally, limited assessment of the distal anterior cerebral arteries due to motion.  Posterior circulation: Normal flow related enhancement of the included P3 segments. Luminal irregularity and at least mid grade stenosis of the RIGHT V4 segment. Near complete loss of the mid basilar artery flow related enhancement. Tiny LEFT and robust RIGHT posterior communicating artery flow voids. Flow related enhancement in the bilateral posterior cerebral arteries.  IMPRESSION: MRI HEAD: Acute large RIGHT basal ganglia acute infarct corresponding to CT abnormality.  Small bilateral chronic holo hemispheric subdural hematomas. Bifrontal hemosiderin staining consistent with remote subarachnoid hemorrhage.  Multifocal encephalomalacia may be posttraumatic or ischemic. Moderate to severe white matter changes suggest chronic small vessel ischemic disease.  MRA HEAD: Moderately motion degraded examination.  At least mid grade stenosis the RIGHT V4 segment. Near complete loss of mid basilar flow related enhancement which may reflect high-grade stenosis and/or artifact. Complete circle of Willis.  No large vessel occlusion, limited assessment of the mid to distal cerebral arteries due to motion. Findings would be better characterized on CTA of the head, as patient had difficulty remaining still for MRA.   Electronically Signed   By: Awilda Metro   On: 12/12/2014 02:41   Dg Chest Port 1 View  12/11/2014   CLINICAL DATA:  AMS. ?low grade fever. No n/v. No apparent resp  distress. No cough. No chest pain. Hx light stroke. Ex smoker. No copd  EXAM: PORTABLE CHEST - 1 VIEW  COMPARISON:  10/03/2013  FINDINGS: Cardiac silhouette is mildly enlarged. Normal mediastinal and hilar contours.  Lungs are mildly hyperexpanded. Small stable granuloma in the left upper lobe. Lungs otherwise clear. No pleural effusion or pneumothorax.  There stable changes from the ORIF of a proximal right humerus fracture. Bony thorax is demineralized.  IMPRESSION: No acute cardiopulmonary disease.   Electronically Signed   By: Amie Portland M.D.   On: 12/11/2014 17:14   Mr Maxine Glenn Head/brain Wo Cm  12/12/2014   CLINICAL DATA:  Confusion new onset LEFT-sided weakness. CT demonstrates RIGHT basal ganglia age indeterminate infarct. History of hypertension, atrial fibrillation and stroke.  EXAM: MRI HEAD WITHOUT CONTRAST  MRA HEAD WITHOUT CONTRAST  TECHNIQUE: Multiplanar, multiecho pulse sequences of the brain and surrounding structures were obtained without intravenous contrast. Angiographic images of the head were obtained using MRA technique without contrast.  COMPARISON:  CT of the head December 11, 2014  at 1731 hours and CT of the head Jan 25, 2014  FINDINGS: MRI HEAD FINDINGS  4.1 x 2.1 cm area of reduced diffusion within RIGHT basal ganglia, corresponding to CT abnormality. Associated low ADC values consistent with acute infarct. Extra-axial hemosiderin staining along the bilateral frontal lobes, no intraparenchymal suspicious susceptibility artifact.  Bilateral holo hemispheric subdural hematomas measure up to 4 mm on the RIGHT, considering bright T2 signal, these are chronic. RIGHT inferior temporal lobe encephalomalacia with mild ex vacuo dilatation RIGHT temporal horn. LEFT greater the RIGHT frontal lobe encephalomalacia. Moderate to severe white matter changes suggest chronic small vessel ischemic disease. No midline shift. No mass lesions.  Ocular globes and orbital contents are unremarkable. Paranasal sinus  are well aerated. Trace bilateral mastoid effusions. No abnormal sellar expansion. No cerebellar tonsillar ectopia. Generalized bright T1 bone marrow signal most consistent with osteopenia. Patient is edentulous.  MRA HEAD FINDINGS - moderately motion degraded examination.  Anterior circulation: Flow related enhancement of the cervical, petrous, cavernous and supra clinoid internal carotid arteries. Tiny anterior communicating artery present with normal flow related enhancement of the anterior and middle cerebral arteries proximally, limited assessment of the distal anterior cerebral arteries due to motion.  Posterior circulation: Normal flow related enhancement of the included P3 segments. Luminal irregularity and at least mid grade stenosis of the RIGHT V4 segment. Near complete loss of the mid basilar artery flow related enhancement. Tiny LEFT and robust RIGHT posterior communicating artery flow voids. Flow related enhancement in the bilateral posterior cerebral arteries.  IMPRESSION: MRI HEAD: Acute large RIGHT basal ganglia acute infarct corresponding to CT abnormality.  Small bilateral chronic holo hemispheric subdural hematomas. Bifrontal hemosiderin staining consistent with remote subarachnoid hemorrhage.  Multifocal encephalomalacia may be posttraumatic or ischemic. Moderate to severe white matter changes suggest chronic small vessel ischemic disease.  MRA HEAD: Moderately motion degraded examination.  At least mid grade stenosis the RIGHT V4 segment. Near complete loss of mid basilar flow related enhancement which may reflect high-grade stenosis and/or artifact. Complete circle of Willis.  No large vessel occlusion, limited assessment of the mid to distal cerebral arteries due to motion. Findings would be better characterized on CTA of the head, as patient had difficulty remaining still for MRA.   Electronically Signed   By: Awilda Metroourtnay  Bloomer   On: 12/12/2014 02:41    Scheduled Meds: . atorvastatin  40  mg Oral q1800  . dabigatran  150 mg Oral BID  . [START ON 12/13/2014] levofloxacin (LEVAQUIN) IV  500 mg Intravenous Q48H  . metoprolol  50 mg Oral Daily   Continuous Infusions: . sodium chloride 75 mL/hr at 12/11/14 1957    Principal Problem:   Stroke Active Problems:   Traumatic subarachnoid hemorrhage   Chronic back pain   Atrial fibrillation   Anticoagulated   HTN (hypertension)   Left-sided weakness   Acute encephalopathy   Hematuria    Time spent: 40 minutes   [facility] Center[facility],Roselle Norton  [facility] Pager [phone]. If 7PM-7AM, please contact night-coverage at [email or web address], password [email or web address] 12/12/2014, 12:48 PM  LOS: 1 day

## 2014-12-12 NOTE — Evaluation (Signed)
Clinical/Bedside Swallow Evaluation Patient Details  Name: Jerry [name] MRN: [id number] Date of Birth: 1924-08-19  Today's Date: 12/12/2014 Time: SLP Start Time (ACUTE ONLY): 1327 SLP Stop Time (ACUTE ONLY): 1340 SLP Time Calculation (min) (ACUTE ONLY): 13 min  Past Medical History:  Past Medical History  Diagnosis Date  . Hypertension   . Arthritis   . Hernia   . Dysrhythmia     atrial fibrillation  . Head injury   . Humerus fracture     right  . Asthma     as a child  . Pneumonia   . Stroke     "light stroke"  . H/O hiatal hernia   . Stroke   . A-fib   . Back pain    Past Surgical History:  Past Surgical History  Procedure Laterality Date  . Finger amputation Left     little  . Orif humerus fracture Right 09/15/2013    Procedure: OPEN REDUCTION INTERNAL FIXATION (ORIF) RIGHT HUMERAL SHAFT FRACTURE;  Surgeon: Cheral Almas, MD;  Location: [facility] OR;  Service: Orthopedics;  Laterality: Right;   HPI:  Jerry [name] is a 81 y.o. male with PMH of HTN, stroke, A fib on pradaxa, asthma, hiatal hernia, history of subarachnoid hemorrhage, who presents with altered mental status and left side weakness.  [facility] and [facility].  It seems that he had been confused for the last several days as per the [facility]. He had some gross hematuria today so went to see primary care doctor. As per the nurse there, the primary doctor referred him to urology. At about 3:20 PM, he became unresponsive and was unable to talk. He was noted to be drooling with question of left facial droop and left sided weakness. Patient does not seem to have fever, chills, chest pain, shortness of breath, cough, abdominal pain, leg edema, rashes.  In ED, patient was found to have right basal ganglia infarct by CT-head, which appears new compared to prior head CT. The patient with previous swallow work up with an MBS on 02/24/2013 with documented silent penetration of thin liquids  which he was able to clear given cues.     Assessment / Plan / Recommendation Clinical Impression  Pt demonstrates normal swallow function, improved since MBS in 2014. Pt able to masticate solids adequately, no wet vocal quality noted with thin liquids or other signs of aspiration. Recommend pt resume a regular texture diet with thin liquids. No SLP f/u needed. Will sign off.     Aspiration Risk  Mild    Diet Recommendation Regular;Thin liquid   Liquid Administration via: Cup;Straw Medication Administration: Whole meds with liquid Supervision: Staff to assist with self feeding Postural Changes and/or Swallow Maneuvers: Seated upright 90 degrees    Other  Recommendations Oral Care Recommendations: Oral care BID   Follow Up Recommendations  None    Frequency and Duration        Pertinent Vitals/Pain NA    SLP Swallow Goals     Swallow Study Prior Functional Status       General HPI: Jerry [name] is a 81 y.o. male with PMH of HTN, stroke, A fib on pradaxa, asthma, hiatal hernia, history of subarachnoid hemorrhage, who presents with altered mental status and left side weakness.  [facility] and came in via EMS.  It seems that he had been confused for the last several days as per the [facility] center. He  had some gross hematuria today so went to see primary care doctor. As per the nurse there, the primary doctor referred him to urology. At about 3:20 PM, he became unresponsive and was unable to talk. He was noted to be drooling with question of left facial droop and left sided weakness. Patient does not seem to have fever, chills, chest pain, shortness of breath, cough, abdominal pain, leg edema, rashes.  In ED, patient was found to have right basal ganglia infarct by CT-head, which appears new compared to prior head CT. The patient with previous swallow work up with an MBS on 02/24/2013 with documented silent penetration of thin liquids which he was able to clear  given cues.   Type of Study: Bedside swallow evaluation Diet Prior to this Study: NPO Temperature Spikes Noted: No Respiratory Status: Room air History of Recent Intubation: No Behavior/Cognition: Alert;Cooperative;Pleasant mood Oral Cavity - Dentition: Dentures, top;Dentures, bottom Self-Feeding Abilities: Needs assist Patient Positioning: Upright in bed Baseline Vocal Quality: Clear Volitional Cough: Strong Volitional Swallow: Able to elicit    Oral/Motor/Sensory Function Overall Oral Motor/Sensory Function: Appears within functional limits for tasks assessed   Ice Chips     Thin Liquid Thin Liquid: Within functional limits Presentation: Cup;Straw    Nectar Thick Nectar Thick Liquid: Not tested   Honey Thick Honey Thick Liquid: Not tested   Puree Puree: Within functional limits   Solid   GO    Solid: Within functional limits       [name], [name] 12/12/2014,1:43 PM

## 2014-12-12 NOTE — Progress Notes (Signed)
PT Cancellation Note  Patient Details Name: Jerry LifeJames C Metzgar MRN: [id number] DOB: 1923-12-30   Cancelled Treatment:    Reason Eval/Treat Not Completed: Medical issues which prohibited therapy Pt has active bedrest orders. Holding PT evaluation until increase in activity orders. MD please update activity orders when appropriate for therapy. Thank you.    Alvie HeidelbergFolan, Ezma Rehm A 12/12/2014, 8:53 AM Alvie HeidelbergShauna Folan, PT, DPT [phone]

## 2014-12-12 NOTE — Progress Notes (Signed)
ANTICOAGULATION CONSULT NOTE - Initial Consult  Pharmacy Consult for apixaban Indication: atrial fibrillation  Allergies  Allergen Reactions  . Hydrocodone Other (See Comments)    Reaction unknown  . Penicillins Other (See Comments)    REACTION: unknown  . Tetanus Toxoids Other (See Comments)    unknown    Vital Signs: Temp: 98.3 F (36.8 C) (03/19 1417) Temp Source: Oral (03/19 1417) BP: 175/95 mmHg (03/19 1417) Pulse Rate: 80 (03/19 1417)  Labs:  Recent Labs  12/11/14 1654 12/11/14 1711  HGB 13.3 15.0  HCT 39.1 44.0  PLT 120*  --   APTT 67*  --   LABPROT 24.0*  --   INR 2.13*  --   CREATININE 1.39* 1.20    CrCl cannot be calculated (Unknown ideal weight.).   Medical History: Past Medical History  Diagnosis Date  . Hypertension   . Arthritis   . Hernia   . Dysrhythmia     atrial fibrillation  . Head injury   . Humerus fracture     right  . Asthma     as a child  . Pneumonia   . Stroke     "light stroke"  . H/O hiatal hernia   . Stroke   . A-fib   . Back pain     Medications:  Prescriptions prior to admission  Medication Sig Dispense Refill Last Dose  . amLODipine (NORVASC) 10 MG tablet Take 10 mg by mouth daily.   12/11/2014 at Unknown time  . dabigatran (PRADAXA) 150 MG CAPS capsule Take 150 mg by mouth 2 (two) times daily.   12/11/2014 at 0800  . metoprolol (LOPRESSOR) 50 MG tablet Take 50 mg by mouth daily.    12/11/2014 at 0800  . traMADol (ULTRAM) 50 MG tablet Take 1 tablet (50 mg total) by mouth every 6 (six) hours as needed. 15 tablet 0 unknown    Assessment: 81 year old man on Pradaxa for AFIB stroke prevention who was admitted for an acute stroke despite Pradaxa.  Asked to change to Apixaban.  Scr 1.2, age 81, last weight is 70kg.  Plan:  Apixaban 5mg  po BID Ask nursing to weigh patient to confirm dose.  Mickeal SkinnerFrens, Tayleigh Wetherell John 12/12/2014,5:10 PM

## 2014-12-12 NOTE — Progress Notes (Signed)
OT Cancellation Note  Patient Details Name: Jerry [name] MRN: [id number] DOB: Aug 12, 1924   Cancelled Treatment:    Reason Eval/Treat Not Completed: Medical issues which prohibited therapy. Pt has active bedrest orders. Acute OT will follow up when pt medically ready. MD please update activity orders when appropriate for therapy. Thank you.   Nena JordanMiller, Nitya Cauthon M 12/12/2014, 7:44 AM

## 2014-12-13 ENCOUNTER — Inpatient Hospital Stay (HOSPITAL_COMMUNITY): Payer: Medicare Other

## 2014-12-13 DIAGNOSIS — I639 Cerebral infarction, unspecified: Secondary | ICD-10-CM | POA: Insufficient documentation

## 2014-12-13 LAB — COMPREHENSIVE METABOLIC PANEL
ALBUMIN: 2.7 g/dL — AB (ref 3.5–5.2)
ALT: 9 U/L (ref 0–53)
ANION GAP: 7 (ref 5–15)
AST: 14 U/L (ref 0–37)
Alkaline Phosphatase: 52 U/L (ref 39–117)
BILIRUBIN TOTAL: 1 mg/dL (ref 0.3–1.2)
BUN: 10 mg/dL (ref 6–23)
CO2: 23 mmol/L (ref 19–32)
CREATININE: 1.08 mg/dL (ref 0.50–1.35)
Calcium: 8.1 mg/dL — ABNORMAL LOW (ref 8.4–10.5)
Chloride: 107 mmol/L (ref 96–112)
GFR calc Af Amer: 69 mL/min — ABNORMAL LOW (ref >90)
GFR calc non Af Amer: 59 mL/min — ABNORMAL LOW (ref >90)
Glucose, Bld: 86 mg/dL (ref 70–99)
Potassium: 3.4 mmol/L — ABNORMAL LOW (ref 3.5–5.1)
Sodium: 137 mmol/L (ref 135–145)
Total Protein: 5.4 g/dL — ABNORMAL LOW (ref 6.0–8.3)

## 2014-12-13 LAB — CBC
HCT: 35.8 % — ABNORMAL LOW (ref 39.0–52.0)
Hemoglobin: 11.8 g/dL — ABNORMAL LOW (ref 13.0–17.0)
MCH: 28.6 pg (ref 26.0–34.0)
MCHC: 33 g/dL (ref 30.0–36.0)
MCV: 86.9 fL (ref 78.0–100.0)
Platelets: 96 10*3/uL — ABNORMAL LOW (ref 150–400)
RBC: 4.12 MIL/uL — ABNORMAL LOW (ref 4.22–5.81)
RDW: 13.9 % (ref 11.5–15.5)
WBC: 5.1 10*3/uL (ref 4.0–10.5)

## 2014-12-13 LAB — URINE CULTURE: Colony Count: 85000

## 2014-12-13 MED ORDER — POTASSIUM CHLORIDE IN NACL 20-0.9 MEQ/L-% IV SOLN
INTRAVENOUS | Status: AC
  Administered 2014-12-13: 17:00:00 via INTRAVENOUS
  Filled 2014-12-13: qty 1000

## 2014-12-13 MED ORDER — METOPROLOL TARTRATE 12.5 MG HALF TABLET
12.5000 mg | ORAL_TABLET | Freq: Every day | ORAL | Status: DC
  Administered 2014-12-14: 12.5 mg via ORAL
  Filled 2014-12-13: qty 1

## 2014-12-13 MED ORDER — IOHEXOL 350 MG/ML SOLN
50.0000 mL | Freq: Once | INTRAVENOUS | Status: AC | PRN
  Administered 2014-12-13: 50 mL via INTRAVENOUS

## 2014-12-13 NOTE — Evaluation (Signed)
Physical Therapy Evaluation Patient Details Name: MELVIN WHITEFORD MRN: [id number] DOB: Mar 17, 1924 Today's Date: 12/13/2014   History of Present Illness  DARIS HARKINS is a 81 y.o. male with PMH of HTN, stroke, A fib on pradaxa, asthma, hiatal hernia, history of subarachnoid hemorrhage, who presents with altered mental status and left side weakness. MRI revealed large R basal ganglia acute infarct.  Clinical Impression  Pt admitted with above diagnosis. Pt currently with functional limitations due to the deficits listed below (see PT Problem List).  Pt is a poor historian, so unsure of prior functional mobility status accuracy. Pt reports he was ambulating mod I with RW or cane PTA.  Pt will benefit from skilled PT to increase their independence and safety with mobility to allow discharge to the venue listed below.       Follow Up Recommendations SNF;Supervision/Assistance - 24 hour    Equipment Recommendations  Rolling walker with 5" wheels    Recommendations for Other Services       Precautions / Restrictions Precautions Precautions: Fall      Mobility  Bed Mobility Overal bed mobility: Needs Assistance Bed Mobility: Supine to Sit     Supine to sit: HOB elevated;Mod assist     General bed mobility comments: verbal/tactile cues for sequencing, use of bedrail by pt, assist to bring up trunk and scoot hips to EOB  Transfers Overall transfer level: Needs assistance Equipment used: Rolling walker (2 wheeled) Transfers: Sit to/from [employer] Sit to Stand: Min assist Stand pivot transfers: Mod assist       General transfer comment: continuous verbal cues for sequencing, assist with L hand to grip RW  Ambulation/Gait   Ambulation Distance (Feet): 2 Feet Assistive device: Rolling walker (2 wheeled) Gait Pattern/deviations: Ataxic;Step-through pattern;Decreased stride length Gait velocity: decreased      Stairs            Wheelchair Mobility     Modified Rankin (Stroke Patients Only) Modified Rankin (Stroke Patients Only) Pre-Morbid Rankin Score: Slight disability Modified Rankin: Moderately severe disability     Balance Overall balance assessment: Needs assistance Sitting-balance support: Bilateral upper extremity supported;Feet supported Sitting balance-Leahy Scale: Fair   Postural control: Left lateral lean Standing balance support: Bilateral upper extremity supported;During functional activity Standing balance-Leahy Scale: Poor                               Pertinent Vitals/Pain Pain Assessment: No/denies pain    Home Living Family/patient expects to be discharged to:: Skilled nursing facility                      Prior Function Level of Independence: Independent with assistive device(s)         Comments: Per chart, pt resided in a retirement home.  Pt is a poor historian, so unsure of PMH accuracy.  He reports being independent with ambulation using cane or RW.     Hand Dominance   Dominant Hand: Right    Extremity/Trunk Assessment   Upper Extremity Assessment: Defer to OT evaluation           Lower Extremity Assessment: LLE deficits/detail   LLE Deficits / Details: generalized weakness noted with LLE weakness significantly > RLE weakness     Communication   Communication: Receptive difficulties;Expressive difficulties  Cognition Arousal/Alertness: Awake/alert Behavior During Therapy: Flat affect Overall Cognitive Status: Impaired/Different from baseline Area of Impairment: Orientation;Attention;Safety/judgement;Awareness;Problem  solving;Memory Orientation Level: Disoriented to;Situation Current Attention Level: Sustained Memory: Decreased short-term memory;Decreased recall of precautions   Safety/Judgement: Decreased awareness of safety;Decreased awareness of deficits Awareness: Intellectual Problem Solving: Slow processing;Decreased initiation;Difficulty  sequencing;Requires verbal cues      General Comments      Exercises        Assessment/Plan    PT Assessment Patient needs continued PT services  PT Diagnosis Abnormality of gait;Altered mental status   PT Problem List Decreased strength;Decreased activity tolerance;Decreased balance;Decreased mobility;Decreased coordination;Decreased knowledge of precautions;Decreased safety awareness;Decreased cognition  PT Treatment Interventions DME instruction;Gait training;Functional mobility training;Therapeutic activities;Therapeutic exercise;Patient/family education;Cognitive remediation;Neuromuscular re-education;Balance training   PT Goals (Current goals can be found in the Care Plan section) Acute Rehab PT Goals Patient Stated Goal: not stated PT Goal Formulation: With patient Time For Goal Achievement: 12/27/14 Potential to Achieve Goals: Good    Frequency Min 3X/week   Barriers to discharge        Co-evaluation               End of Session Equipment Utilized During Treatment: Gait belt Activity Tolerance: Patient tolerated treatment well Patient left: in chair;with call bell/phone within reach;with chair alarm set Nurse Communication: Mobility status         Time: 0852-0920 PT Time Calculation (min) (ACUTE ONLY): 28 min   Charges:   PT Evaluation $Initial PT Evaluation Tier I: 1 Procedure PT Treatments $Therapeutic Activity: 8-22 mins   PT G Codes:        Ilda FoilGarrow, Rasheem Figiel Rene 12/13/2014, 9:34 AM

## 2014-12-13 NOTE — Progress Notes (Signed)
STROKE TEAM PROGRESS NOTE   HISTORY Jerry [name] is an 81 y.o. male hx HTN, stroke, A fib on pradaxa presenting with altered mental status. He had been confused for the last several days as per the retirement center. This afternoon became more unresponsive and unable to talk. Noted to be drooling with question of left facial droop and left sided weakness.  Patient unable to provide further history due to altered mental status.   CT head imaging reviewed, shows right basal ganglia infarct which appears new compared to prior head CT.   Date last known well: 12/08/2014 Time last known well: unclear time tPA Given: no, last known normal 3 days ago    SUBJECTIVE (INTERVAL HISTORY) No family members present today. The patient appears to be more alert today. He reports that he feels better. Overall he seems to be improving. He remains slow in responding to questions.   OBJECTIVE Temp:  [97.3 F (36.3 C)-98.5 F (36.9 C)] 97.7 F (36.5 C) (03/20 0640) Pulse Rate:  [68-95] 85 (03/20 0640) Cardiac Rhythm:  [-] Atrial fibrillation (03/19 2000) Resp:  [18] 18 (03/20 0640) BP: (134-175)/(64-95) 163/94 mmHg (03/20 0640) SpO2:  [92 %-98 %] 98 % (03/20 0640) Weight:  [71.6 kg (157 lb 13.6 oz)] 71.6 kg (157 lb 13.6 oz) (03/19 1717)  No results for input(s): GLUCAP in the last 168 hours.  Recent Labs Lab 12/11/14 1654 12/11/14 1711  NA 139 139  K 4.0 3.9  CL 104 102  CO2 24  --   GLUCOSE 149* 155*  BUN 18 21  CREATININE 1.39* 1.20  CALCIUM 9.2  --     Recent Labs Lab 12/11/14 1654  AST 17  ALT 9  ALKPHOS 59  BILITOT 1.5*  PROT 6.1  ALBUMIN 3.5    Recent Labs Lab 12/11/14 1654 12/11/14 1711  WBC 11.1*  --   NEUTROABS 8.4*  --   HGB 13.3 15.0  HCT 39.1 44.0  MCV 85.7  --   PLT 120*  --    No results for input(s): CKTOTAL, CKMB, CKMBINDEX, TROPONINI in the last 168 hours.  Recent Labs  12/11/14 1654  LABPROT 24.0*  INR 2.13*    Recent Labs  12/11/14 1850   COLORURINE RED*  LABSPEC 1.018  PHURINE 6.0  GLUCOSEU NEGATIVE  HGBUR LARGE*  BILIRUBINUR MODERATE*  KETONESUR 40*  PROTEINUR 100*  UROBILINOGEN 1.0  NITRITE NEGATIVE  LEUKOCYTESUR MODERATE*       Component Value Date/Time   CHOL 170 12/12/2014 0616   TRIG 90 12/12/2014 0616   HDL 28* 12/12/2014 0616   CHOLHDL 6.1 12/12/2014 0616   VLDL 18 12/12/2014 0616   LDLCALC 124* 12/12/2014 0616   Lab Results  Component Value Date   HGBA1C  06/14/2007    5.9 (NOTE)   The ADA recommends the following therapeutic goals for glycemic   control related to Hgb A1C measurement:   Goal of Therapy:   < 7.0% Hgb A1C   Action Suggested:  > 8.0% Hgb A1C   Ref:  Diabetes Care, 22, Suppl. 1, 1999      Component Value Date/Time   LABOPIA NONE DETECTED 12/11/2014 1850   COCAINSCRNUR NONE DETECTED 12/11/2014 1850   LABBENZ NONE DETECTED 12/11/2014 1850   AMPHETMU NONE DETECTED 12/11/2014 1850   THCU NONE DETECTED 12/11/2014 1850   LABBARB NONE DETECTED 12/11/2014 1850     Recent Labs Lab 12/11/14 2022  ETH <5   I have personally reviewed the radiological images  below and agree with the radiology interpretations.  Ct Head Wo Contrast 12/11/2014    Negative for acute hemorrhage.  New low density in the upper right basal ganglia and extending into the right corona radiata. Findings are compatible with an infarct of uncertain age. The infarct could be acute or subacute.  Cerebral atrophy with evidence of chronic small vessel ischemic changes.  Old insult in the right temporal lobe.     Mr Brain Wo Contrast 12/12/2014    MRI HEAD:  Acute large RIGHT basal ganglia acute infarct corresponding to CT abnormality.   Small bilateral chronic holo hemispheric subdural hematomas.  Bifrontal hemosiderin staining consistent with remote subarachnoid hemorrhage.   Multifocal encephalomalacia may be posttraumatic or ischemic.  Moderate to severe white matter changes suggest chronic small vessel ischemic  disease.    MRA HEAD:  Moderately motion degraded examination.   At least mid grade stenosis the RIGHT V4 segment.  Near complete loss of mid basilar flow related enhancement which may reflect high-grade stenosis and/or artifact.  Complete circle of Willis.  No large vessel occlusion, limited assessment of the mid to distal cerebral arteries due to motion. Findings would be better characterized on CTA of the head, as patient had difficulty remaining still for MRA.     Dg Chest Port 1 View 12/11/2014   No acute cardiopulmonary disease.     CTA of Head and Neck 12/13/2014 The patient was unable to remain motionless for the exam. Small or subtle lesions could be overlooked.  No proximal anterior circulation stenosis or large vessel occlusion. 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  2D echo pending  PHYSICAL EXAM  Temp:  [97.3 F (36.3 C)-98.5 F (36.9 C)] 97.7 F (36.5 C) (03/20 0640) Pulse Rate:  [68-95] 85 (03/20 0640) Resp:  [18] 18 (03/20 0640) BP: (134-175)/(64-95) 163/94 mmHg (03/20 0640) SpO2:  [92 %-98 %] 98 % (03/20 0640) Weight:  [71.6 kg (157 lb 13.6 oz)] 71.6 kg (157 lb 13.6 oz) (03/19 1717)  General - Well nourished, well developed, sleepy but easily arousable and in no apparent distress.  Ophthalmologic - fundi not visualized due to incorporation.  Cardiovascular - irregularly irregular heart rate and rhythm.  Mental Status -  Level of arousal and orientation to month, self, age, and person were intact, however not orientated to place and year. Language including expression, naming, repetition, comprehension was assessed and found grossly intact, however, hypophonia, and paucity of speech, slow in response to questions.  Cranial Nerves II - XII - II - Visual field intact OU. III, IV, VI - Extraocular movements intact. V - Facial sensation intact bilaterally. VII -  Facial movement intact bilaterally. VIII - Hearing & vestibular intact bilaterally. X - Palate elevates symmetrically, mild to moderate dysarthria. XI - Chin turning & shoulder shrug intact bilaterally. XII - Tongue protrusion intact.  Motor Strength - The patient's strength was 4/5 in all extremities and pronator drift was absent.  Bulk was normal and fasciculations were absent.   Motor Tone - Muscle tone was assessed at the neck and appendages and was normal.  Reflexes - The patient's reflexes were 1+ in all extremities and he had no pathological reflexes.  Sensory - Light touch, temperature/pinprick were assessed and were normal.    Coordination - The patient had normal movements in the hands with no ataxia or dysmetria, however slow in response.  Tremor was absent.  Gait and  Station - not tested due to safety concerns.  ASSESSMENT/PLAN Jerry [name] is a 81 y.o. male with history of traumatic brain injury, hypertension, atrial fibrillation on Pradaxa, recent hematuria, and history of a subarachnoid hemorrhage and subdural hematoma after falling presenting with altered mental status and decreased responsiveness. He did not receive IV t-PA due to late presentation.  Stroke:  Right basal ganglia infarct - large in size, probably embolic secondary to atrial fibrillation despite Pradaxa.  Resultant - altered mental status, lethargy  MRI - Acute large RIGHT basal ganglia acute infarct    MRA - high-grade stenosis mid basilar artery stenosis vs. artifact.   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  2D Echo - pending  LDL - 124, not at goal  EEG - pending  HgbA1c - pending  Pradaxa and SCDs for VTE prophylaxis  Diet Heart with thin liquids  pradaxa (dabigatran) prior to admission. Now on Eliquis due to history of SAH and SDH due to falls. Eliquis has a lower bleeding risk profile among NOACs. Aspirin discontinued.  Ongoing aggressive stroke risk  factor management  Therapy recommendations: Return to SNF   Disposition: Pending  Atrial fibrillation on Pradaxa  Chronic atrial fibrillation  On Pradaxa  PTT 67, INR 2.13, indicating patient taking and responding to Pradaxa  switched to Eliquis due to history of as SAH and SDH  Hematuria, ? UTI  UA RBC too numerous to count, WBC 3-6  On Levaquin  Continue monitor  Hypertension  Home meds: Norvasc 5 mg daily and Lopressor 50 mg daily  BP labile  Permissive hypertension <220/120 for 24-48 hours and then gradually normalize within 5-7 days  Close monitor  Hyperlipidemia  Home meds: No lipid lowering medications prior to admission.  LDL 124, goal < 70  Now on Lipitor 40 mg daily  Continue statin at discharge  Other Stroke Risk Factors  Advanced age  Other Active Problems  Low normal B-12 level - 292,   B-12 supplement ordered as suggested.  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 I also obtained additional history from pt's daughter at bedside. I also discussed with Dr. Susie CassetteAbrol regarding his care plan. Together with the NP/PA, we formulated the assessment and plan of care which reflects our mutual decision.  I have made any additions or clarifications directly to the above note and agree with the findings and plan as currently documented.   81 year old male with history of A. fib on Pradaxa, hypertension, chronic SDH and SAH due to fall presented from nursing home for altered mental status and hematuria. MRI showed right basal ganglia large infarct, concerning for embolic cause.  However his PTT and INR indicating he is taking and responding to Pradaxa. 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 Pending 2-D echo, EEG, A1c. Given history of SDH and SAH, switched Pradaxa to Eliquis for stroke prevention. Would not consider addition of aspirin due to history of SAH and SDH. LDL was high, on Lipitor. B-12 was low, on B-12 supplement. Close monitoring hematuria, PT OT.   Marvel PlanJindong Umaiza Matusik, MD PhD Stroke Neurology 12/13/2014 3:08 PM   o  contact Stroke Continuity provider, please refer to [email or web address] After hours, contact General Neurology

## 2014-12-13 NOTE — Evaluation (Signed)
Occupational Therapy Evaluation Patient Details Name: TASHA JINDRA MRN: [id number] DOB: 1924-08-12 Today's Date: 12/13/2014    History of Present Illness Jerry [name] is a 81 y.o. male with PMH of HTN, stroke, A fib on pradaxa, asthma, hiatal hernia, history of subarachnoid hemorrhage, who presents with altered mental status and left side weakness. MRI revealed large R basal ganglia acute infarct.   Clinical Impression   PTA pt lived at SNF and reports that he was independent with use of AD, however pt is disoriented and question accuracy of PLOF. Pt required mod A to transfer to recliner, then returned to bed to go for procedure. Pt presents with slow processing and decreased cognition. D/C plan is back to SNF and pt will benefit from acute OT to address independence with ADLs.     Follow Up Recommendations  SNF;Supervision/Assistance - 24 hour    Equipment Recommendations  None recommended by OT    Recommendations for Other Services       Precautions / Restrictions Precautions Precautions: Fall Restrictions Weight Bearing Restrictions: No      Mobility Bed Mobility Overal bed mobility: Needs Assistance Bed Mobility: Supine to Sit;Sit to Supine     Supine to sit: Min guard;HOB elevated Sit to supine: Min assist;HOB elevated   General bed mobility comments: Use of bed rails. Very slow but no physical assist needed to get up. Pt required min A to assist LEs onto bed to return supine.   Transfers Overall transfer level: Needs assistance Equipment used: Rolling walker (2 wheeled) Transfers: Sit to/from Stand Sit to Stand: Min assist Stand pivot transfers: Mod assist       General transfer comment: VC's for sequencing.     Balance Overall balance assessment: Needs assistance Sitting-balance support: Bilateral upper extremity supported;Feet supported Sitting balance-Leahy Scale: Fair   Postural control: Left lateral lean Standing balance support: Bilateral upper  extremity supported;During functional activity Standing balance-Leahy Scale: Poor                              ADL Overall ADL's : Needs assistance/impaired                                                       Pertinent Vitals/Pain Pain Assessment: No/denies pain     Hand Dominance Right   Extremity/Trunk Assessment Upper Extremity Assessment Upper Extremity Assessment: Generalized weakness;LUE deficits/detail LUE Deficits / Details: LUE weaker than RUE. Shoulder flexion 3-/5, bicep/ tricep 3+5, grip strength 4/5. 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  LUE Coordination: decreased gross motor   Lower Extremity Assessment Lower Extremity Assessment: Defer to PT evaluation LLE Deficits / Details: generalized weakness noted with LLE weakness significantly > RLE weakness   Cervical / Trunk Assessment Cervical / Trunk Assessment: Kyphotic   Communication Communication Communication: No difficulties   Cognition Arousal/Alertness: Awake/alert Behavior During Therapy: Flat affect Overall Cognitive Status: No family/caregiver present to determine baseline cognitive functioning Area of Impairment: Orientation;Attention;Safety/judgement;Awareness;Problem solving;Memory Orientation Level: Disoriented to;Time;Situation Current Attention Level: Sustained Memory: Decreased short-term memory;Decreased recall of precautions   Safety/Judgement: Decreased awareness of safety;Decreased awareness of deficits Awareness: Intellectual Problem Solving: Slow processing;Decreased initiation;Difficulty sequencing;Requires verbal cues  Home Living Family/patient expects to be discharged to:: Skilled nursing facility                                        Prior Functioning/Environment Level of Independence: Independent with assistive device(s)        Comments: Per chart, pt resided in a retirement  home.  Pt is a poor historian, so unsure of PMH accuracy.  He reports being independent with ambulation using cane or RW.    OT Diagnosis: Generalized weakness;Cognitive deficits;Altered mental status   OT Problem List: Decreased strength;Decreased activity tolerance;Impaired balance (sitting and/or standing);Decreased coordination;Decreased cognition;Impaired UE functional use;Decreased safety awareness   OT Treatment/Interventions: Self-care/ADL training;Therapeutic exercise;Neuromuscular education;Energy conservation;DME and/or AE instruction;Therapeutic activities;Cognitive remediation/compensation;Patient/family education;Balance training    OT Goals(Current goals can be found in the care plan section) Acute Rehab OT Goals Patient Stated Goal: eat lunch OT Goal Formulation: With patient Time For Goal Achievement: 12/27/14 Potential to Achieve Goals: Good  OT Frequency: Min 1X/week    End of Session Equipment Utilized During Treatment: Gait belt;Rolling walker  Activity Tolerance: Patient tolerated treatment well Patient left: in bed;with call bell/phone within reach;Other (comment) (leaving for procedure)   Time: 1140-1155 OT Time Calculation (min): 15 min Charges:  OT General Charges $OT Visit: 1 Procedure OT Evaluation $Initial OT Evaluation Tier I: 1 Procedure G-Codes:    Rae LipsMiller, Billiejean Schimek M 12/13/2014, 12:21 PM  Carney LivingLeeAnn Marie Blakelyn Dinges, OTR/L Occupational Therapist [phone] (pager)

## 2014-12-13 NOTE — Progress Notes (Signed)
VASCULAR LAB    Patient had negative CTA of neck.  Please advise if you would like Carotid Dopplers. Thank you       Kaedance Magos, RVT 12/13/2014, 2:33 PM

## 2014-12-13 NOTE — Discharge Instructions (Signed)

## 2014-12-13 NOTE — Progress Notes (Signed)
[facility] PROGRESS NOTE  Jerry [name] YNW:[id number] DOB: 10-30-1923 DOA: 12/11/2014 PCP: [name], MD  Assessment/Plan: Principal Problem:   Stroke Active Problems:   Traumatic subarachnoid hemorrhage   Chronic back pain   Atrial fibrillation   Anticoagulated   HTN (hypertension)   Left-sided weakness   Acute encephalopathy   Hematuria   Hyperlipidemia    Stroke: Acute large RIGHT basal ganglia acute infarct corresponding to CT abnormality.At least mid grade stenosis the RIGHT V4 segment. Near complete loss of mid basilar flow related enhancement which may reflect high-grade stenosis and/or artifact  Cont  tele bed, -Appreciate neurology's consultation, with follow-up recommendations as follows 1. HgbA1c, fasting lipid panel, LDL 124, start statin 3. PT consult, OT consult, recommended SNF 4. Echocardiogram pending Discontinue carotid Dopplers as patient will have a CTA head and neck  Prophylactic therapy-continue pradaxa, neurology recommends switching to Eliquis given history of subarachnoid hemorrhage and subdural hemorrhage due to falls Currently on a heart healthy diet with thin liquids Urinalysis positive for mild UTI  , CBC, B12 is low to 92, begin repletion,  TSH and ammonia level within normal limits Neurology recommend CTA head and neck, which is pending EEG pending  Hypokalemia Replete  Hematuria: Likely due to UTI. Urinalysis is positive for moderate leukocyte with many bacteria. -Start IV Levaquin -follow-up urine culture -[facility], patient was given referral to urology, may follow-up with urology after discharge  Acute encephalopathy: Likely due to combination of UTI and stroke Neurochecks stable   HTN: Hold Norvasc, and decrease the dose of metoprolol given  low blood pressure  A. Fib: CHA2DS2-VASc Score is 5, need oral anticoagulation.  Patient is on Pradaxa at home. INR is 2.13 on admission. Heart rate is well  controlled Patient switched to Eliquis, heart rate controlled on metoprolol    Code Status: full Family Communication: family updated about patient's clinical progress Disposition Plan: SNF   Brief narrative: Jerry [name] is a 81 y.o. male with PMH of HTN, stroke, A fib on pradaxa, asthma, hiatal hernia, history of subarachnoid hemorrhage, who presents with altered mental status and left side weakness.   [facility]. History is from ED report and EMS. It seems that he had been confused for the last several days as per the [facility]. He had some gross hematuria today so went to see primary care doctor. As per the nurse there, the primary doctor referred him to urology. At about 3:20 PM, he became unresponsive and was unable to talk. He was noted to be drooling with question of left facial droop and left sided weakness. Patient does not seem to have fever, chills, chest pain, shortness of breath, cough, abdominal pain, leg edema, rashes.  In ED, patient was found to have right basal ganglia infarct by CT-head, which appears new compared to prior head CT. WBC 11.1, troponin negative, and they are 2.13, electrolytes okay. Patient is admitted to inpatient for further evaluation and treatment. Neurology was consulted  Consultants:  neurology  Procedures:  None     Antibiotics: Anti-infectives:@  HPI/Subjective: Improving left facial droop and left sided weakness, no cp, no sob   Objective: Filed Vitals:   12/12/14 2209 12/13/14 0125 12/13/14 0640 12/13/14 1032  BP: 161/64 137/71 163/94 101/65  Pulse: 73 68 85 66  Temp: 98.5 F (36.9 C) 98.1 F (36.7 C) 97.7 F (36.5 C) 98.3 F (36.8 C)  TempSrc: Oral Oral Oral Oral  Resp: 18  Height:      Weight:      SpO2: 98% 97% 98% 97%    Intake/Output Summary (Last 24 hours) at 12/13/14 1241 Last data filed at 12/13/14 0848  Gross per 24 hour  Intake    420 ml  Output    150 ml   Net    270 ml    Exam:  General: No acute respiratory distress Lungs: Clear to auscultation bilaterally without wheezes or crackles Cardiovascular: Regular rate and rhythm without murmur gallop or rub normal S1 and S2 Abdomen: Nontender, nondistended, soft, bowel sounds positive, no rebound, no ascites, no appreciable mass Extremities: No significant cyanosis, clubbing, or edema bilateral lower extremities Neuro: drowsy, and oriented to person, but not to time and place, cranial nerves II-XII grossly intact, muscle strength 3/5 in left arm and 4/5 in right leg     Data Reviewed: Basic Metabolic Panel:  Recent Labs Lab 12/11/14 1654 12/11/14 1711 12/13/14 0758  NA 139 139 137  K 4.0 3.9 3.4*  CL 104 102 107  CO2 24  --  23  GLUCOSE 149* 155* 86  BUN CREATININE 1.39* 1.20 1.08  CALCIUM 9.2  --  8.1*    Liver Function Tests:  Recent Labs Lab 12/11/14 1654 12/13/14 0758  AST 17 14  ALT 9 9  ALKPHOS 59 52  BILITOT 1.5* 1.0  PROT 6.1 5.4*  ALBUMIN 3.5 2.7*   No results for input(s): LIPASE, AMYLASE in the last 168 hours.  Recent Labs Lab 12/11/14 2022  AMMONIA 11    CBC:  Recent Labs Lab 12/11/14 1654 12/11/14 1711 12/13/14 0758  WBC 11.1*  --  5.1  NEUTROABS 8.4*  --   --   HGB 13.3 15.0 11.8*  HCT 39.1 44.0 35.8*  MCV 85.7  --  86.9  PLT 120*  --  96*    Cardiac Enzymes: No results for input(s): CKTOTAL, CKMB, CKMBINDEX, TROPONINI in the last 168 hours. BNP (last 3 results) No results for input(s): BNP in the last 8760 hours.  ProBNP (last 3 results) No results for input(s): PROBNP in the last 8760 hours.    CBG: No results for input(s): GLUCAP in the last 168 hours.  No results found for this or any previous visit (from the past 240 hour(s)).   Studies: Ct Head Wo Contrast  12/11/2014   CLINICAL DATA:  Found down and unresponsive.  Altered mental status.  EXAM: CT HEAD WITHOUT CONTRAST  TECHNIQUE: Contiguous axial images  were obtained from the base of the skull through the vertex without contrast.  COMPARISON:  01/25/2014  FINDINGS: There is chronic encephalomalacia in the right temporal lobe compatible with an old insult. There is new low-density throughout the upper right basal ganglia extending into the right corona radiata. Again noted is diffuse low density throughout the white matter. Stable cerebral atrophy. No evidence for acute hemorrhage, mass lesion, midline shift or hydrocephalus. The paranasal sinuses are clear. No acute bone abnormality.  IMPRESSION: Negative for acute hemorrhage.  New low density in the upper right basal ganglia and extending into the right corona radiata. Findings are compatible with an infarct of uncertain age. The infarct could be acute or subacute.  Cerebral atrophy with evidence of chronic small vessel ischemic changes.  Old insult in the right temporal lobe.   Electronically Signed   By: Richarda Overlie M.D.   On: 12/11/2014 17:53   Mr Brain Wo Contrast  12/12/2014   CLINICAL DATA:  Confusion new onset LEFT-sided weakness. CT demonstrates RIGHT basal ganglia age indeterminate infarct. History of hypertension, atrial fibrillation and stroke.  EXAM: MRI HEAD WITHOUT CONTRAST  MRA HEAD WITHOUT CONTRAST  TECHNIQUE: Multiplanar, multiecho pulse sequences of the brain and surrounding structures were obtained without intravenous contrast. Angiographic images of the head were obtained using MRA technique without contrast.  COMPARISON:  CT of the head December 11, 2014 at 1731 hours and CT of the head Jan 25, 2014  FINDINGS: MRI HEAD FINDINGS  4.1 x 2.1 cm area of reduced diffusion within RIGHT basal ganglia, corresponding to CT abnormality. Associated low ADC values consistent with acute infarct. Extra-axial hemosiderin staining along the bilateral frontal lobes, no intraparenchymal suspicious susceptibility artifact.  Bilateral holo hemispheric subdural hematomas measure up to 4 mm on the RIGHT, considering  bright T2 signal, these are chronic. RIGHT inferior temporal lobe encephalomalacia with mild ex vacuo dilatation RIGHT temporal horn. LEFT greater the RIGHT frontal lobe encephalomalacia. Moderate to severe white matter changes suggest chronic small vessel ischemic disease. No midline shift. No mass lesions.  Ocular globes and orbital contents are unremarkable. Paranasal sinus are well aerated. Trace bilateral mastoid effusions. No abnormal sellar expansion. No cerebellar tonsillar ectopia. Generalized bright T1 bone marrow signal most consistent with osteopenia. Patient is edentulous.  MRA HEAD FINDINGS - moderately motion degraded examination.  Anterior circulation: Flow related enhancement of the cervical, petrous, cavernous and supra clinoid internal carotid arteries. Tiny anterior communicating artery present with normal flow related enhancement of the anterior and middle cerebral arteries proximally, limited assessment of the distal anterior cerebral arteries due to motion.  Posterior circulation: Normal flow related enhancement of the included P3 segments. Luminal irregularity and at least mid grade stenosis of the RIGHT V4 segment. Near complete loss of the mid basilar artery flow related enhancement. Tiny LEFT and robust RIGHT posterior communicating artery flow voids. Flow related enhancement in the bilateral posterior cerebral arteries.  IMPRESSION: MRI HEAD: Acute large RIGHT basal ganglia acute infarct corresponding to CT abnormality.  Small bilateral chronic holo hemispheric subdural hematomas. Bifrontal hemosiderin staining consistent with remote subarachnoid hemorrhage.  Multifocal encephalomalacia may be posttraumatic or ischemic. Moderate to severe white matter changes suggest chronic small vessel ischemic disease.  MRA HEAD: Moderately motion degraded examination.  At least mid grade stenosis the RIGHT V4 segment. Near complete loss of mid basilar flow related enhancement which may reflect  high-grade stenosis and/or artifact. Complete circle of Willis.  No large vessel occlusion, limited assessment of the mid to distal cerebral arteries due to motion. Findings would be better characterized on CTA of the head, as patient had difficulty remaining still for MRA.   Electronically Signed   By: Awilda Metroourtnay  Bloomer   On: 12/12/2014 02:41   Dg Chest Port 1 View  12/11/2014   CLINICAL DATA:  AMS. ?low grade fever. No n/v. No apparent resp distress. No cough. No chest pain. Hx light stroke. Ex smoker. No copd  EXAM: PORTABLE CHEST - 1 VIEW  COMPARISON:  10/03/2013  FINDINGS: Cardiac silhouette is mildly enlarged. Normal mediastinal and hilar contours.  Lungs are mildly hyperexpanded. Small stable granuloma in the left upper lobe. Lungs otherwise clear. No pleural effusion or pneumothorax.  There stable changes from the ORIF of a proximal right humerus fracture. Bony thorax is demineralized.  IMPRESSION: No acute cardiopulmonary disease.   Electronically Signed   By: Amie Portlandavid  Ormond M.D.   On: 12/11/2014 17:14   Mr Maxine GlennMra  Head/brain Wo Cm  12/12/2014   CLINICAL DATA:  Confusion new onset LEFT-sided weakness. CT demonstrates RIGHT basal ganglia age indeterminate infarct. History of hypertension, atrial fibrillation and stroke.  EXAM: MRI HEAD WITHOUT CONTRAST  MRA HEAD WITHOUT CONTRAST  TECHNIQUE: Multiplanar, multiecho pulse sequences of the brain and surrounding structures were obtained without intravenous contrast. Angiographic images of the head were obtained using MRA technique without contrast.  COMPARISON:  CT of the head December 11, 2014 at 1731 hours and CT of the head Jan 25, 2014  FINDINGS: MRI HEAD FINDINGS  4.1 x 2.1 cm area of reduced diffusion within RIGHT basal ganglia, corresponding to CT abnormality. Associated low ADC values consistent with acute infarct. Extra-axial hemosiderin staining along the bilateral frontal lobes, no intraparenchymal suspicious susceptibility artifact.  Bilateral holo  hemispheric subdural hematomas measure up to 4 mm on the RIGHT, considering bright T2 signal, these are chronic. RIGHT inferior temporal lobe encephalomalacia with mild ex vacuo dilatation RIGHT temporal horn. LEFT greater the RIGHT frontal lobe encephalomalacia. Moderate to severe white matter changes suggest chronic small vessel ischemic disease. No midline shift. No mass lesions.  Ocular globes and orbital contents are unremarkable. Paranasal sinus are well aerated. Trace bilateral mastoid effusions. No abnormal sellar expansion. No cerebellar tonsillar ectopia. Generalized bright T1 bone marrow signal most consistent with osteopenia. Patient is edentulous.  MRA HEAD FINDINGS - moderately motion degraded examination.  Anterior circulation: Flow related enhancement of the cervical, petrous, cavernous and supra clinoid internal carotid arteries. Tiny anterior communicating artery present with normal flow related enhancement of the anterior and middle cerebral arteries proximally, limited assessment of the distal anterior cerebral arteries due to motion.  Posterior circulation: Normal flow related enhancement of the included P3 segments. Luminal irregularity and at least mid grade stenosis of the RIGHT V4 segment. Near complete loss of the mid basilar artery flow related enhancement. Tiny LEFT and robust RIGHT posterior communicating artery flow voids. Flow related enhancement in the bilateral posterior cerebral arteries.  IMPRESSION: MRI HEAD: Acute large RIGHT basal ganglia acute infarct corresponding to CT abnormality.  Small bilateral chronic holo hemispheric subdural hematomas. Bifrontal hemosiderin staining consistent with remote subarachnoid hemorrhage.  Multifocal encephalomalacia may be posttraumatic or ischemic. Moderate to severe white matter changes suggest chronic small vessel ischemic disease.  MRA HEAD: Moderately motion degraded examination.  At least mid grade stenosis the RIGHT V4 segment. Near  complete loss of mid basilar flow related enhancement which may reflect high-grade stenosis and/or artifact. Complete circle of Willis.  No large vessel occlusion, limited assessment of the mid to distal cerebral arteries due to motion. Findings would be better characterized on CTA of the head, as patient had difficulty remaining still for MRA.   Electronically Signed   By: Awilda Metro   On: 12/12/2014 02:41    Scheduled Meds: . apixaban  5 mg Oral BID  . atorvastatin  40 mg Oral q1800  . levofloxacin (LEVAQUIN) IV  500 mg Intravenous Q48H  . metoprolol  50 mg Oral Daily  . vitamin B-12  1,000 mcg Oral Daily   Continuous Infusions: . sodium chloride 75 mL/hr at 12/13/14 1610    Principal Problem:   Stroke Active Problems:   Traumatic subarachnoid hemorrhage   Chronic back pain   Atrial fibrillation   Anticoagulated   HTN (hypertension)   Left-sided weakness   Acute encephalopathy   Hematuria   Hyperlipidemia    Time spent: 40 minutes   [facility]  [facility] Pager [phone].  If 7PM-7AM, please contact night-coverage at [email or web address], password [email or web address] 12/13/2014, 12:41 PM  LOS: 2 days

## 2014-12-14 ENCOUNTER — Inpatient Hospital Stay (HOSPITAL_COMMUNITY): Payer: Medicare Other

## 2014-12-14 DIAGNOSIS — I6789 Other cerebrovascular disease: Secondary | ICD-10-CM

## 2014-12-14 DIAGNOSIS — I639 Cerebral infarction, unspecified: Secondary | ICD-10-CM

## 2014-12-14 LAB — HEMOGLOBIN A1C
Hgb A1c MFr Bld: 4.9 % (ref 4.8–5.6)
Mean Plasma Glucose: 94 mg/dL

## 2014-12-14 MED ORDER — LEVOFLOXACIN 250 MG PO TABS: 250.0000 mg | ORAL_TABLET | Freq: Every day | ORAL | Status: DC

## 2014-12-14 MED ORDER — LEVOFLOXACIN 250 MG PO TABS
250.0000 mg | ORAL_TABLET | Freq: Every day | ORAL | Status: DC
  Filled 2014-12-14: qty 1

## 2014-12-14 MED ORDER — METOPROLOL TARTRATE 50 MG PO TABS: 25.0000 mg | ORAL_TABLET | Freq: Every day | ORAL | Status: DC

## 2014-12-14 MED ORDER — APIXABAN 5 MG PO TABS: 5.0000 mg | ORAL_TABLET | Freq: Two times a day (BID) | ORAL | Status: AC

## 2014-12-14 MED ORDER — TRAMADOL HCL 50 MG PO TABS: 50.0000 mg | ORAL_TABLET | Freq: Four times a day (QID) | ORAL | Status: AC | PRN

## 2014-12-14 MED ORDER — CYANOCOBALAMIN 1000 MCG PO TABS: 1000.0000 ug | ORAL_TABLET | Freq: Every day | ORAL | Status: AC

## 2014-12-14 MED ORDER — ATORVASTATIN CALCIUM 40 MG PO TABS: 40.0000 mg | ORAL_TABLET | Freq: Every day | ORAL | Status: DC

## 2014-12-14 NOTE — Procedures (Cosign Needed)
ELECTROENCEPHALOGRAM REPORT  Date of Study: 12/14/2014  Patient's Name: Jerry [name] MRN: [id number] Date of Birth: Jun 20, 1924  Referring Provider: Delton Seeavid Rinehuls  Indication: 81 year old man with UTI, altered mental status and possible stroke  Medications: Pradaxa Levaquin Metoprolol atorvastatin  Technical Summary: This is a multichannel digital EEG recording, using the international 10-20 placement system.  Spike detection software was employed.  Description: The EEG background is symmetric and mostly recorded during drowsiness.  During the most awake segments, there is a well-developed posterior dominant rhythm of 8 Hz, which is reactive to eye opening and closing.  Diffuse beta activity is seen, with a bilateral frontal preponderance.  No focal or generalized abnormalities are seen.  No focal or generalized epileptiform discharges are seen.  Stage II sleep is not seen.  Hyperventilation and photic stimulation were not performed.  ECG revealed normal cardiac rate and rhythm.  Impression: This is a normal routine EEG of the mostly drowsy state, without activating procedures.  A normal study does not rule out the possibility of a seizure disorder in this patient.  Adam R. Everlena CooperJaffe, DO

## 2014-12-14 NOTE — Progress Notes (Signed)
EEG Completed; Results Pending  

## 2014-12-14 NOTE — Progress Notes (Signed)
Echocardiogram 2D Echocardiogram has been performed.  Dorothey BasemanReel, Salome M 12/14/2014, 12:31 PM

## 2014-12-14 NOTE — Progress Notes (Signed)
Discharge orders received, [facility]  IV and telemetry D/C.  D/C instructions and Rx in packet for receiving facility and report called to ALF. PTAR brought pt downstairs via stretcher.

## 2014-12-14 NOTE — Discharge Summary (Signed)
Physician Discharge Summary  Jerry [name] MRN: [id number] DOB/AGE: [id number] 79 y.o.  PCP: Leonard Downing, MD   Admit date: 12/11/2014 Discharge date: 12/14/2014  Discharge Diagnoses:     Principal Problem:   Stroke Active Problems:   Traumatic subarachnoid hemorrhage   Chronic back pain   Atrial fibrillation   Anticoagulated   HTN (hypertension)   Left-sided weakness   Acute encephalopathy   Hematuria   Hyperlipidemia   Cerebral infarction due to unspecified mechanism  Follow-up recommendations In follow-up with PCP in 5-7 days In follow-up CBC CMP in 1 week Follow-up with Dr. Erlinda Hong neurology in 2 months     Medication List    STOP taking these medications        dabigatran 150 MG Caps capsule  Commonly known as:  PRADAXA      TAKE these medications        amLODipine 10 MG tablet  Commonly known as:  NORVASC  Take 10 mg by mouth daily., start a systolic blood pressure greater than 140      apixaban 5 MG Tabs tablet  Commonly known as:  ELIQUIS  Take 1 tablet (5 mg total) by mouth 2 (two) times daily.     atorvastatin 40 MG tablet  Commonly known as:  LIPITOR  Take 1 tablet (40 mg total) by mouth daily at 6 PM.     cyanocobalamin 1000 MCG tablet  Take 1 tablet (1,000 mcg total) by mouth daily.     levofloxacin 250 MG tablet  Commonly known as:  LEVAQUIN  Take 1 tablet (250 mg total) by mouth at bedtime.     metoprolol 50 MG tablet  Commonly known as:  LOPRESSOR  Take 25 mg by mouth daily.     traMADol 50 MG tablet  Commonly known as:  ULTRAM  Take 1 tablet (50 mg total) by mouth every 6 (six) hours as needed.        Discharge Condition:    Disposition: 01-Home or Self Care   Consults: Neurology    Significant Diagnostic Studies: Ct Angio Head W/cm &/or Wo Cm  12/13/2014   CLINICAL DATA:  New onset of confusion and LEFT-sided weakness beginning 12/11/2014. History of hypertension, atrial fibrillation, and prior stroke.  Recent MRI demonstrates Large acute RIGHT MCA territory infarction affecting predominantly the basal ganglia. Abnormal MRA degraded by motion. Initial encounter.  EXAM: CT ANGIOGRAPHY HEAD AND NECK  TECHNIQUE: Multidetector CT imaging of the head and neck was performed using the standard protocol during bolus administration of intravenous contrast. Multiplanar CT image reconstructions and MIPs were obtained to evaluate the vascular anatomy. Carotid stenosis measurements (when applicable) are obtained utilizing NASCET criteria, using the distal internal carotid diameter as the denominator.  CONTRAST:  67m OMNIPAQUE IOHEXOL 350 MG/ML SOLN  COMPARISON:  CT head 12/11/2014.  MR head 12/11/2014.  FINDINGS: CT HEAD  Calvarium and skull base: No fracture or destructive lesion. Mastoids and middle ears are grossly clear.  Paranasal sinuses: Imaged portions demonstrate no air-fluid levels.  Orbits: No acute findings.  Prior BILATERAL cataract extraction.  Brain: Advanced atrophy. Extensive chronic microvascular ischemic change. Widespread remote lacunar and large vessel infarcts of a chronic nature. BILATERAL subdural hematomas of a chronic nature are difficult to see, better evaluated on MR. Cytotoxic edema most notable in the RIGHT lentiform nucleus and regional white matter. No hemorrhagic transformation.  CTA NECK  The patient was unable to remain motionless for the exam. Small or subtle lesions could  be overlooked.  Aortic arch: Standard branching. Imaged portion shows no evidence of aneurysm or dissection. No significant stenosis of the major arch vessel origins.  Right carotid system: Predominantly calcific atheromatous change at the bifurcation without flow reducing lesion. No evidence of dissection, stenosis (50% or greater) or occlusion.  Left carotid system: Predominantly calcific atheromatous change at the bifurcation without flow reducing lesion. No evidence of dissection, stenosis (50% or greater) or  occlusion.  Vertebral arteries: LEFT slightly larger. No evidence of dissection, stenosis (50% or greater) or occlusion.  Nonvascular soft tissues: Motion degraded images of the lung apices show no mass or pneumothorax. Motion degraded soft tissue images the neck demonstrate no visible neck masses. There is advanced cervical spondylosis.  CTA HEAD  Anterior circulation: Mild BILATERAL cavernous atheromatous changes non flow reducing ICA termini widely patent. Codominant anterior cerebrals. No large vessel occlusion of the M1 or M2 segments. No significant stenosis, proximal occlusion, aneurysm, or vascular malformation. No significant irregularity at the site of origin RIGHT lenticulostriate vessels.  Posterior circulation: Estimated 50% stenosis of the V4 segment RIGHT vertebral. Focal stenosis of the midbasilar artery as suspected from MR. Superimposed moderate dolichoectasia swinging far to the RIGHT, with estimated 50-75% cross-sectional area reduction compared with the distal lumen. Slight hypoplasia related to fetal origin RIGHT PCA.  Venous sinuses: As permitted by contrast timing, patent.  Anatomic variants: None of significance.  Delayed phase:   No abnormal intracranial enhancement.  IMPRESSION: No proximal anterior circulation stenosis or large vessel occlusion.  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   Electronically Signed   By: Rolla Flatten M.D.   On: 12/13/2014 13:21   Ct Head Wo Contrast  12/11/2014   CLINICAL DATA:  Found down and unresponsive.  Altered mental status.  EXAM: CT HEAD WITHOUT CONTRAST  TECHNIQUE: Contiguous axial images were obtained from the base of the skull through the vertex without contrast.  COMPARISON:  01/25/2014  FINDINGS: There is chronic encephalomalacia in the right temporal lobe compatible with an old insult. There is new low-density throughout the upper right basal ganglia  extending into the right corona radiata. Again noted is diffuse low density throughout the white matter. Stable cerebral atrophy. No evidence for acute hemorrhage, mass lesion, midline shift or hydrocephalus. The paranasal sinuses are clear. No acute bone abnormality.  IMPRESSION: Negative for acute hemorrhage.  New low density in the upper right basal ganglia and extending into the right corona radiata. Findings are compatible with an infarct of uncertain age. The infarct could be acute or subacute.  Cerebral atrophy with evidence of chronic small vessel ischemic changes.  Old insult in the right temporal lobe.   Electronically Signed   By: Markus Daft M.D.   On: 12/11/2014 17:53   Ct Angio Neck W/cm &/or Wo/cm  12/13/2014   CLINICAL DATA:  New onset of confusion and LEFT-sided weakness beginning 12/11/2014. History of hypertension, atrial fibrillation, and prior stroke. Recent MRI demonstrates Large acute RIGHT MCA territory infarction affecting predominantly the basal ganglia. Abnormal MRA degraded by motion. Initial encounter.  EXAM: CT ANGIOGRAPHY HEAD AND NECK  TECHNIQUE: Multidetector CT imaging of the head and neck was performed using the standard protocol during bolus administration of intravenous contrast. Multiplanar CT image reconstructions and MIPs were obtained to evaluate the vascular anatomy. Carotid stenosis measurements (when applicable) are obtained utilizing NASCET criteria, using the distal internal carotid diameter as the denominator.  CONTRAST:  44m OMNIPAQUE IOHEXOL 350 MG/ML SOLN  COMPARISON:  CT head 12/11/2014.  MR head 12/11/2014.  FINDINGS: CT HEAD  Calvarium and skull base: No fracture or destructive lesion. Mastoids and middle ears are grossly clear.  Paranasal sinuses: Imaged portions demonstrate no air-fluid levels.  Orbits: No acute findings.  Prior BILATERAL cataract extraction.  Brain: Advanced atrophy. Extensive chronic microvascular ischemic change. Widespread remote lacunar  and large vessel infarcts of a chronic nature. BILATERAL subdural hematomas of a chronic nature are difficult to see, better evaluated on MR. Cytotoxic edema most notable in the RIGHT lentiform nucleus and regional white matter. No hemorrhagic transformation.  CTA NECK  The patient was unable to remain motionless for the exam. Small or subtle lesions could be overlooked.  Aortic arch: Standard branching. Imaged portion shows no evidence of aneurysm or dissection. No significant stenosis of the major arch vessel origins.  Right carotid system: Predominantly calcific atheromatous change at the bifurcation without flow reducing lesion. No evidence of dissection, stenosis (50% or greater) or occlusion.  Left carotid system: Predominantly calcific atheromatous change at the bifurcation without flow reducing lesion. No evidence of dissection, stenosis (50% or greater) or occlusion.  Vertebral arteries: LEFT slightly larger. No evidence of dissection, stenosis (50% or greater) or occlusion.  Nonvascular soft tissues: Motion degraded images of the lung apices show no mass or pneumothorax. Motion degraded soft tissue images the neck demonstrate no visible neck masses. There is advanced cervical spondylosis.  CTA HEAD  Anterior circulation: Mild BILATERAL cavernous atheromatous changes non flow reducing ICA termini widely patent. Codominant anterior cerebrals. No large vessel occlusion of the M1 or M2 segments. No significant stenosis, proximal occlusion, aneurysm, or vascular malformation. No significant irregularity at the site of origin RIGHT lenticulostriate vessels.  Posterior circulation: Estimated 50% stenosis of the V4 segment RIGHT vertebral. Focal stenosis of the midbasilar artery as suspected from MR. Superimposed moderate dolichoectasia swinging far to the RIGHT, with estimated 50-75% cross-sectional area reduction compared with the distal lumen. Slight hypoplasia related to fetal origin RIGHT PCA.  Venous  sinuses: As permitted by contrast timing, patent.  Anatomic variants: None of significance.  Delayed phase:   No abnormal intracranial enhancement.  IMPRESSION: No proximal anterior circulation stenosis or large vessel occlusion.  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   Electronically Signed   By: JRolla FlattenM.D.   On: 12/13/2014 13:21   Mr Brain Wo Contrast  12/12/2014   CLINICAL DATA:  Confusion new onset LEFT-sided weakness. CT demonstrates RIGHT basal ganglia age indeterminate infarct. History of hypertension, atrial fibrillation and stroke.  EXAM: MRI HEAD WITHOUT CONTRAST  MRA HEAD WITHOUT CONTRAST  TECHNIQUE: Multiplanar, multiecho pulse sequences of the brain and surrounding structures were obtained without intravenous contrast. Angiographic images of the head were obtained using MRA technique without contrast.  COMPARISON:  CT of the head December 11, 2014 at 1731 hours and CT of the head Jan 25, 2014  FINDINGS: MRI HEAD FINDINGS  4.1 x 2.1 cm area of reduced diffusion within RIGHT basal ganglia, corresponding to CT abnormality. Associated low ADC values consistent with acute infarct. Extra-axial hemosiderin staining along the bilateral frontal lobes, no intraparenchymal suspicious susceptibility artifact.  Bilateral holo hemispheric subdural hematomas measure up to 4 mm on the RIGHT, considering bright T2 signal, these are chronic. RIGHT inferior temporal lobe encephalomalacia with mild ex vacuo dilatation RIGHT temporal horn. LEFT greater the RIGHT  frontal lobe encephalomalacia. Moderate to severe white matter changes suggest chronic small vessel ischemic disease. No midline shift. No mass lesions.  Ocular globes and orbital contents are unremarkable. Paranasal sinus are well aerated. Trace bilateral mastoid effusions. No abnormal sellar expansion. No cerebellar tonsillar ectopia. Generalized bright T1 bone marrow  signal most consistent with osteopenia. Patient is edentulous.  MRA HEAD FINDINGS - moderately motion degraded examination.  Anterior circulation: Flow related enhancement of the cervical, petrous, cavernous and supra clinoid internal carotid arteries. Tiny anterior communicating artery present with normal flow related enhancement of the anterior and middle cerebral arteries proximally, limited assessment of the distal anterior cerebral arteries due to motion.  Posterior circulation: Normal flow related enhancement of the included P3 segments. Luminal irregularity and at least mid grade stenosis of the RIGHT V4 segment. Near complete loss of the mid basilar artery flow related enhancement. Tiny LEFT and robust RIGHT posterior communicating artery flow voids. Flow related enhancement in the bilateral posterior cerebral arteries.  IMPRESSION: MRI HEAD: Acute large RIGHT basal ganglia acute infarct corresponding to CT abnormality.  Small bilateral chronic holo hemispheric subdural hematomas. Bifrontal hemosiderin staining consistent with remote subarachnoid hemorrhage.  Multifocal encephalomalacia may be posttraumatic or ischemic. Moderate to severe white matter changes suggest chronic small vessel ischemic disease.  MRA HEAD: Moderately motion degraded examination.  At least mid grade stenosis the RIGHT V4 segment. Near complete loss of mid basilar flow related enhancement which may reflect high-grade stenosis and/or artifact. Complete circle of Willis.  No large vessel occlusion, limited assessment of the mid to distal cerebral arteries due to motion. Findings would be better characterized on CTA of the head, as patient had difficulty remaining still for MRA.   Electronically Signed   By: Elon Alas   On: 12/12/2014 02:41   Dg Chest Port 1 View  12/11/2014   CLINICAL DATA:  AMS. ?low grade fever. No n/v. No apparent resp distress. No cough. No chest pain. Hx light stroke. Ex smoker. No copd  EXAM: PORTABLE  CHEST - 1 VIEW  COMPARISON:  10/03/2013  FINDINGS: Cardiac silhouette is mildly enlarged. Normal mediastinal and hilar contours.  Lungs are mildly hyperexpanded. Small stable granuloma in the left upper lobe. Lungs otherwise clear. No pleural effusion or pneumothorax.  There stable changes from the ORIF of a proximal right humerus fracture. Bony thorax is demineralized.  IMPRESSION: No acute cardiopulmonary disease.   Electronically Signed   By: Lajean Manes M.D.   On: 12/11/2014 17:14   Mr Jodene Nam Head/brain Wo Cm  12/12/2014   CLINICAL DATA:  Confusion new onset LEFT-sided weakness. CT demonstrates RIGHT basal ganglia age indeterminate infarct. History of hypertension, atrial fibrillation and stroke.  EXAM: MRI HEAD WITHOUT CONTRAST  MRA HEAD WITHOUT CONTRAST  TECHNIQUE: Multiplanar, multiecho pulse sequences of the brain and surrounding structures were obtained without intravenous contrast. Angiographic images of the head were obtained using MRA technique without contrast.  COMPARISON:  CT of the head December 11, 2014 at 1731 hours and CT of the head Jan 25, 2014  FINDINGS: MRI HEAD FINDINGS  4.1 x 2.1 cm area of reduced diffusion within RIGHT basal ganglia, corresponding to CT abnormality. Associated low ADC values consistent with acute infarct. Extra-axial hemosiderin staining along the bilateral frontal lobes, no intraparenchymal suspicious susceptibility artifact.  Bilateral holo hemispheric subdural hematomas measure up to 4 mm on the RIGHT, considering bright T2 signal, these are chronic. RIGHT inferior temporal lobe encephalomalacia with mild ex vacuo dilatation RIGHT temporal horn.  LEFT greater the RIGHT frontal lobe encephalomalacia. Moderate to severe white matter changes suggest chronic small vessel ischemic disease. No midline shift. No mass lesions.  Ocular globes and orbital contents are unremarkable. Paranasal sinus are well aerated. Trace bilateral mastoid effusions. No abnormal sellar expansion. No  cerebellar tonsillar ectopia. Generalized bright T1 bone marrow signal most consistent with osteopenia. Patient is edentulous.  MRA HEAD FINDINGS - moderately motion degraded examination.  Anterior circulation: Flow related enhancement of the cervical, petrous, cavernous and supra clinoid internal carotid arteries. Tiny anterior communicating artery present with normal flow related enhancement of the anterior and middle cerebral arteries proximally, limited assessment of the distal anterior cerebral arteries due to motion.  Posterior circulation: Normal flow related enhancement of the included P3 segments. Luminal irregularity and at least mid grade stenosis of the RIGHT V4 segment. Near complete loss of the mid basilar artery flow related enhancement. Tiny LEFT and robust RIGHT posterior communicating artery flow voids. Flow related enhancement in the bilateral posterior cerebral arteries.  IMPRESSION: MRI HEAD: Acute large RIGHT basal ganglia acute infarct corresponding to CT abnormality.  Small bilateral chronic holo hemispheric subdural hematomas. Bifrontal hemosiderin staining consistent with remote subarachnoid hemorrhage.  Multifocal encephalomalacia may be posttraumatic or ischemic. Moderate to severe white matter changes suggest chronic small vessel ischemic disease.  MRA HEAD: Moderately motion degraded examination.  At least mid grade stenosis the RIGHT V4 segment. Near complete loss of mid basilar flow related enhancement which may reflect high-grade stenosis and/or artifact. Complete circle of Willis.  No large vessel occlusion, limited assessment of the mid to distal cerebral arteries due to motion. Findings would be better characterized on CTA of the head, as patient had difficulty remaining still for MRA.   Electronically Signed   By: Elon Alas   On: 12/12/2014 02:41     Microbiology: Recent Results (from the past 240 hour(s))  Urine culture     Status: None   Collection Time: 12/12/14   3:49 PM  Result Value Ref Range Status   Specimen Description URINE, CLEAN CATCH  Final   Special Requests NONE  Final   Colony Count   Final    85,000 COLONIES/ML Performed at [facility]    Culture   Final    Multiple bacterial morphotypes present, none predominant. Suggest appropriate recollection if clinically indicated. Performed at [facility]    Report Status 12/13/2014 FINAL  Final     Labs: Results for orders placed or performed during the hospital encounter of 12/11/14 (from the past 48 hour(s))  Urine culture     Status: None   Collection Time: 12/12/14  3:49 PM  Result Value Ref Range   Specimen Description URINE, CLEAN CATCH    Special Requests NONE    Colony Count      85,000 COLONIES/ML Performed at [facility]    Culture      Multiple bacterial morphotypes present, none predominant. Suggest appropriate recollection if clinically indicated. Performed at [facility]    Report Status 12/13/2014 FINAL   Comprehensive metabolic panel     Status: Abnormal   Collection Time: 12/13/14  7:58 AM  Result Value Ref Range   Sodium 137 135 - 145 mmol/L   Potassium 3.4 (L) 3.5 - 5.1 mmol/L   Chloride 107 96 - 112 mmol/L   CO2 23 19 - 32 mmol/L   Glucose, Bld 86 70 - 99 mg/dL   BUN 10 6 - 23 mg/dL  Creatinine, Ser 1.08 0.50 - 1.35 mg/dL   Calcium 8.1 (L) 8.4 - 10.5 mg/dL   Total Protein 5.4 (L) 6.0 - 8.3 g/dL   Albumin 2.7 (L) 3.5 - 5.2 g/dL   AST 14 0 - 37 U/L   ALT 9 0 - 53 U/L   Alkaline Phosphatase 52 39 - 117 U/L   Total Bilirubin 1.0 0.3 - 1.2 mg/dL   GFR calc non Af Amer 59 (L) >90 mL/min   GFR calc Af Amer 69 (L) >90 mL/min    Comment: (NOTE) The eGFR has been calculated using the CKD EPI equation. This calculation has not been validated in all clinical situations. eGFR's persistently <90 mL/min signify possible Chronic Kidney Disease.    Anion gap 7 5 - 15  CBC     Status: Abnormal   Collection Time: 12/13/14   7:58 AM  Result Value Ref Range   WBC 5.1 4.0 - 10.5 K/uL   RBC 4.12 (L) 4.22 - 5.81 MIL/uL   Hemoglobin 11.8 (L) 13.0 - 17.0 g/dL    Comment: DELTA CHECK NOTED REPEATED TO VERIFY    HCT 35.8 (L) 39.0 - 52.0 %   MCV 86.9 78.0 - 100.0 fL   MCH 28.6 26.0 - 34.0 pg   MCHC 33.0 30.0 - 36.0 g/dL   RDW 13.9 11.5 - 15.5 %   Platelets 96 (L) 150 - 400 K/uL    Comment: SPECIMEN CHECKED FOR CLOTS REPEATED TO VERIFY PLATELET COUNT CONFIRMED BY SMEAR LARGE PLATELETS PRESENT      HPI Jerry [name] is an 81 y.o. male hx HTN, stroke, A fib on pradaxa presenting with altered mental status. He had been confused for the last several [facility]. This afternoon became more unresponsive and unable to talk. Noted to be drooling with question of left facial droop and left sided weakness  Patient unable to provide further history due to altered mental status.   CT head imaging reviewed, shows right basal ganglia infarct which appears new compared to prior head CT.   HOSPITAL COURSE:  Stroke: Acute large RIGHT basal ganglia acute infarct corresponding to CT abnormality.At least mid grade stenosis the RIGHT V4 segment. Near complete loss of mid basilar flow related enhancement which may reflect high-grade stenosis and/or artifact  telemetry uneventful  1. HgbA1c 4.9, fasting lipid panel, LDL 124, continue statin 3. PT consult, OT consult, recommended SNF 4. Echocardiogram pending, Discontinue carotid Dopplers , CT of the head and neck showed No proximal anterior circulation stenosis or large vessel occlusion.Estimated 50% stenosis RIGHT V4 segment vertebral (non dominant vessel), and estimated 50-75% stenosis of the mid basilar artery Prophylactic discontinued Pradaxa and aspirin neurology recommendations switched to Eliquis given history of subarachnoid hemorrhage and subdural hemorrhage due to falls Currently on a heart healthy diet with thin liquids Urinalysis positive for mild  UTI on Levaquin , CBC, B12 is low to 92, begin repletion,  TSH and ammonia level within normal limits EEG within normal limits  Hypokalemia Repleted  Hematuria: Likely due to UTI. Urinalysis is positive for moderate leukocyte with many bacteria. Patient to continue with Levaquin by mouth outpatient 5 days Urine culture growing 85,000 colonies, speciation pending -[facility], patient was given referral to urology, may follow-up with urology after discharge  Acute encephalopathy: Likely due to combination of UTI and stroke Neurochecks stable   HTN: Resume outpatient blood pressure medications Dose of metoprolol reduced to 25 mg a day Start Norvasc for  systolic blood pressure greater than 140  A. Fib: CHA2DS2-VASc Score is 5, need oral anticoagulation.  Patient is on Pradaxa at home. INR is 2.13 on admission. Heart rate is well controlled Patient switched to Eliquis, heart rate controlled on metoprolol  Discharge Exam:   Blood pressure 121/58, pulse 71, temperature 97.5 F (36.4 C), temperature source Oral, resp. rate 18, height _0  (1.727 m), weight 71.6 kg (157 lb 13.6 oz), SpO2 97 %. General: No acute respiratory distress Lungs: Clear to auscultation bilaterally without wheezes or crackles Cardiovascular: Regular rate and rhythm without murmur gallop or rub normal S1 and S2 Abdomen: Nontender, nondistended, soft, bowel sounds positive, no rebound, no ascites, no appreciable mass Extremities: No significant cyanosis, clubbing, or edema bilateral lower extremities Neuro: drowsy, and oriented to person, but not to time and place, cranial nerves II-XII grossly intact, muscle strength 3/5 in left arm and 4/5 in right leg        Discharge Instructions    Diet - low sodium heart healthy    Complete by:  As directed      Increase activity slowly    Complete by:  As directed              Signed: Kitt Ledet 12/14/2014, 11:22 AM

## 2014-12-14 NOTE — Progress Notes (Signed)
STROKE TEAM PROGRESS NOTE   HISTORY Jerry LifeJames C [name] is an 81 y.o. male hx HTN, stroke, A fib on pradaxa presenting with altered mental status. He had been confused for the last several days as per the [facility] center. This afternoon became more unresponsive and unable to talk. Noted to be drooling with question of left facial droop and left sided weakness.  Patient unable to provide further history due to altered mental status.   CT head imaging reviewed, shows right basal ganglia infarct which appears new compared to prior head CT.   Date last known well: 12/08/2014 Time last known well: unclear time tPA Given: no, last known normal 3 days ago    SUBJECTIVE (INTERVAL HISTORY) No family members present today.   He reports that he feels better. Overall he seems to be improving. He remains slow in responding to questions.   OBJECTIVE Temp:  [97.5 F (36.4 C)-98.7 F (37.1 C)] 97.5 F (36.4 C) (03/21 1046) Pulse Rate:  [69-96] 71 (03/21 1046) Cardiac Rhythm:  [-]  Resp:  [17-18] 18 (03/21 1046) BP: (121-169)/(58-89) 121/58 mmHg (03/21 1100) SpO2:  [93 %-100 %] 97 % (03/21 1046)  No results for input(s): GLUCAP in the last 168 hours.  Recent Labs Lab 12/11/14 1654 12/11/14 1711 12/13/14 0758  NA 139 139 137  K 4.0 3.9 3.4*  CL 104 102 107  CO2 24  --  23  GLUCOSE 149* 155* 86  BUN 18 21 10   CREATININE 1.39* 1.20 1.08  CALCIUM 9.2  --  8.1*    Recent Labs Lab 12/11/14 1654 12/13/14 0758  AST 17 14  ALT 9 9  ALKPHOS 59 52  BILITOT 1.5* 1.0  PROT 6.1 5.4*  ALBUMIN 3.5 2.7*    Recent Labs Lab 12/11/14 1654 12/11/14 1711 12/13/14 0758  WBC 11.1*  --  5.1  NEUTROABS 8.4*  --   --   HGB 13.3 15.0 11.8*  HCT 39.1 44.0 35.8*  MCV 85.7  --  86.9  PLT 120*  --  96*   No results for input(s): CKTOTAL, CKMB, CKMBINDEX, TROPONINI in the last 168 hours.  Recent Labs  12/11/14 1654  LABPROT 24.0*  INR 2.13*    Recent Labs  12/11/14 1850  COLORURINE RED*   LABSPEC 1.018  PHURINE 6.0  GLUCOSEU NEGATIVE  HGBUR LARGE*  BILIRUBINUR MODERATE*  KETONESUR 40*  PROTEINUR 100*  UROBILINOGEN 1.0  NITRITE NEGATIVE  LEUKOCYTESUR MODERATE*       Component Value Date/Time   CHOL 170 12/12/2014 0616   TRIG 90 12/12/2014 0616   HDL 28* 12/12/2014 0616   CHOLHDL 6.1 12/12/2014 0616   VLDL 18 12/12/2014 0616   LDLCALC 124* 12/12/2014 0616   Lab Results  Component Value Date   HGBA1C 4.9 12/12/2014      Component Value Date/Time   LABOPIA NONE DETECTED 12/11/2014 1850   COCAINSCRNUR NONE DETECTED 12/11/2014 1850   LABBENZ NONE DETECTED 12/11/2014 1850   AMPHETMU NONE DETECTED 12/11/2014 1850   THCU NONE DETECTED 12/11/2014 1850   LABBARB NONE DETECTED 12/11/2014 1850     Recent Labs Lab 12/11/14 2022  ETH <5   I have personally reviewed the radiological images below and agree with the radiology interpretations.  Ct Head Wo Contrast 12/11/2014    Negative for acute hemorrhage.  New low density in the upper right basal ganglia and extending into the right corona radiata. Findings are compatible with an infarct of uncertain age. The infarct could be acute  or subacute.  Cerebral atrophy with evidence of chronic small vessel ischemic changes.  Old insult in the right temporal lobe.     Mr Brain Wo Contrast 12/12/2014    MRI HEAD:  Acute large RIGHT basal ganglia acute infarct corresponding to CT abnormality.   Small bilateral chronic holo hemispheric subdural hematomas.  Bifrontal hemosiderin staining consistent with remote subarachnoid hemorrhage.   Multifocal encephalomalacia may be posttraumatic or ischemic.  Moderate to severe white matter changes suggest chronic small vessel ischemic disease.    MRA HEAD:  Moderately motion degraded examination.   At least mid grade stenosis the RIGHT V4 segment.  Near complete loss of mid basilar flow related enhancement which may reflect high-grade stenosis and/or artifact.  Complete circle of  Willis.  No large vessel occlusion, limited assessment of the mid to distal cerebral arteries due to motion. Findings would be better characterized on CTA of the head, as patient had difficulty remaining still for MRA.     Dg Chest Port 1 View 12/11/2014   No acute cardiopulmonary disease.     CTA of Head and Neck 12/13/2014 The patient was unable to remain motionless for the exam. Small or subtle lesions could be overlooked.  No proximal anterior circulation stenosis or large vessel occlusion. 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  2D echo pending  PHYSICAL EXAM  Temp:  [97.5 F (36.4 C)-98.7 F (37.1 C)] 97.5 F (36.4 C) (03/21 1046) Pulse Rate:  [69-96] 71 (03/21 1046) Resp:  [17-18] 18 (03/21 1046) BP: (121-169)/(58-89) 121/58 mmHg (03/21 1100) SpO2:  [93 %-100 %] 97 % (03/21 1046)  General - Well nourished, well developed, sleepy but easily arousable and in no apparent distress.  Ophthalmologic - fundi not visualized due to incorporation.  Cardiovascular - irregularly irregular heart rate and rhythm.  Mental Status -  Level of arousal and orientation to month, self, age, and person were intact, however not orientated to place and year. Language including expression, naming, repetition, comprehension was assessed and found grossly intact, however, hypophonia, and paucity of speech, slow in response to questions.  Cranial Nerves II - XII - II - Visual field intact OU. III, IV, VI - Extraocular movements intact. V - Facial sensation intact bilaterally. VII - Facial movement intact bilaterally. VIII - Hearing & vestibular intact bilaterally. X - Palate elevates symmetrically, mild to moderate dysarthria. XI - Chin turning & shoulder shrug intact bilaterally. XII - Tongue protrusion intact.  Motor Strength - The patient's strength was 4/5 in all extremities and pronator drift was  absent.  Bulk was normal and fasciculations were absent.   Motor Tone - Muscle tone was assessed at the neck and appendages and was normal.  Reflexes - The patient's reflexes were 1+ in all extremities and he had no pathological reflexes.  Sensory - Light touch, temperature/pinprick were assessed and were normal.    Coordination - The patient had normal movements in the hands with no ataxia or dysmetria, however slow in response.  Tremor was absent.  Gait and Station - not tested due to safety concerns.  ASSESSMENT/PLAN [name] is a 81 y.o. male with history of traumatic brain injury, hypertension, atrial fibrillation on Pradaxa, recent hematuria, and history of a subarachnoid hemorrhage and subdural hematoma after falling presenting with altered mental status and decreased responsiveness. He did not receive IV t-PA due to late presentation.  Stroke:  Right basal ganglia  infarct - large in size, probably embolic secondary to atrial fibrillation despite Pradaxa.  Resultant - altered mental status, lethargy  MRI - Acute large RIGHT basal ganglia acute infarct    MRA - high-grade stenosis mid basilar artery stenosis vs. artifact.   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  2D Echo - pending  LDL - 124, not at goal  EEG - pending  HgbA1c - pending  Pradaxa and SCDs for VTE prophylaxis Diet Heart  Diet - low sodium heart healthy with thin liquids  pradaxa (dabigatran) prior to admission. Now on Eliquis due to history of SAH and SDH due to falls. Eliquis has a lower bleeding risk profile among NOACs. Aspirin discontinued.  Ongoing aggressive stroke risk factor management  Therapy recommendations: Return to SNF   Disposition: Pending  Atrial fibrillation on Pradaxa  Chronic atrial fibrillation  On Pradaxa  PTT 67, INR 2.13, indicating patient taking and responding to Pradaxa  switched to Eliquis due to history of as SAH and  SDH  Hematuria, ? UTI  UA RBC too numerous to count, WBC 3-6  On Levaquin  Continue monitor  Hypertension  Home meds: Norvasc 5 mg daily and Lopressor 50 mg daily  BP labile  Permissive hypertension <220/120 for 24-48 hours and then gradually normalize within 5-7 days  Close monitor  Hyperlipidemia  Home meds: No lipid lowering medications prior to admission.  LDL 124, goal < 70  Now on Lipitor 40 mg daily  Continue statin at discharge  Other Stroke Risk Factors  Advanced age  Other Active Problems  Low normal B-12 level - 292,   B-12 supplement ordered as suggested.  Other Pertinent History  History of traumatic brain injury  Chronic SDH due to fall   History of SAH due to fall   Hospital day # 3        I, the attending vascular neurologist, have personally obtained a history, examined the patient, evaluated laboratory data, individually viewed imaging studies and agree with radiology interpretations. I also discussed with Dr. Susie Cassette regarding his care plan. Together with the NP/PA, we formulated the assessment and plan of care which reflects our mutual decision.  I have made any additions or clarifications directly to the above note and agree with the findings and plan as currently documented.   81 year old male with history of A. fib on Pradaxa, hypertension, chronic SDH and SAH due to fall presented from nursing home for altered mental status and hematuria. MRI showed right basal ganglia large infarct, concerning for embolic cause. However his PTT and INR indicating he is taking and responding to Pradaxa. 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 Pending 2-D echo, EEG, A1c. Given history of SDH and SAH, switched Pradaxa to Eliquis for stroke prevention. Would not consider addition of aspirin due to history of SAH and SDH. LDL was high, on Lipitor. B-12 was low, on B-12 supplement. Close monitoring hematuria, PT OT. Follow up as outpatient with Dr Roda Shutters in stroke  clinic in 2 months.  Delia Heady, MD Stroke Neurology 12/14/2014 3:32 PM   o contact Stroke Continuity provider, please refer to [email or web address]. After hours, contact General Neurology

## 2014-12-14 NOTE — Clinical Social Work Psychosocial (Signed)
Clinical Social Work Department BRIEF PSYCHOSOCIAL ASSESSMENT 12/14/2014  Patient:  Jerry [name],Jerry [name]     Account Number:  [id number]     Admit date:  12/11/2014  Clinical Social Worker:  Bo McclintockBIBBS,Carrel Leather, LCSWA  Date/Time:  12/14/2014 02:01 PM  Referred by:  RN  Date Referred:  12/14/2014 Referred for  SNF Placement   Other Referral:   Interview type:  Family Other interview type:   Pt is disoriented; Jerry [name], son [phone]    PSYCHOSOCIAL DATA Living Status:  FACILITY [facility] Level of care:  Assisted Living Primary support name:  Jerry [name], Jerry [name], and Jerry [name] Primary support relationship to patient:  CHILD, ADULT Degree of support available:   Strong Support    CURRENT CONCERNS Current Concerns  None Noted   Other Concerns:    SOCIAL WORK ASSESSMENT / PLAN CSW spoke with the pt's son Jerry [name]. CSW introduced self and purpose of call. Jerry [name] [facility]. Jerry [name] reported he would like to transition back to the ALF. CSW contacted Jerry [name] at the ALF. CSW faxed Jerry [name] clinicals to review. Jerry [name] indicated that they can accommodation the pt's need and they will accept the pt back. CSW called Jerry [name] to inform him that the pt will discharge back to the ALF today. CSW and Jerry [name] discussed ambulance transport. CSW contact [facility] at [phone] to schedule transport for the pt. CSW faxed the pt's discharge. Bedside RN provided number to call report.   Assessment/plan status:  Psychosocial Support/Ongoing Assessment of Needs Other assessment/ plan:   Information/referral to community resources:    PATIENT'S/FAMILY'S RESPONSE TO CURRENT DIAGNOSE: Jerry [name] did not share his feelings/thoughts regarding the pt's current diagnose.   PATIENT'S/FAMILY'S RESPONSE TO PLAN OF CARE: Jerry [name] has agreed for the pt to return back to ALF.    Jerry [name], MSW, LCSWA [phone]

## 2014-12-18 ENCOUNTER — Encounter (HOSPITAL_COMMUNITY): Payer: Self-pay | Admitting: Emergency Medicine

## 2014-12-18 ENCOUNTER — Emergency Department (HOSPITAL_COMMUNITY): Payer: Medicare Other

## 2014-12-18 ENCOUNTER — Emergency Department (HOSPITAL_COMMUNITY)
Admission: EM | Admit: 2014-12-18 | Discharge: 2014-12-18 | Disposition: A | Discharge status: Home / Self Care | Payer: Medicare Other | Attending: Emergency Medicine | Admitting: Emergency Medicine

## 2014-12-18 DIAGNOSIS — Y92121 Bathroom in nursing home as the place of occurrence of the external cause: Secondary | ICD-10-CM | POA: Diagnosis not present

## 2014-12-18 DIAGNOSIS — G459 Transient cerebral ischemic attack, unspecified: Secondary | ICD-10-CM | POA: Insufficient documentation

## 2014-12-18 DIAGNOSIS — Y998 Other external cause status: Secondary | ICD-10-CM | POA: Diagnosis not present

## 2014-12-18 DIAGNOSIS — S8992XA Unspecified injury of left lower leg, initial encounter: Secondary | ICD-10-CM | POA: Diagnosis present

## 2014-12-18 DIAGNOSIS — Z8719 Personal history of other diseases of the digestive system: Secondary | ICD-10-CM | POA: Insufficient documentation

## 2014-12-18 DIAGNOSIS — W010XXA Fall on same level from slipping, tripping and stumbling without subsequent striking against object, initial encounter: Secondary | ICD-10-CM | POA: Diagnosis not present

## 2014-12-18 DIAGNOSIS — Z8781 Personal history of (healed) traumatic fracture: Secondary | ICD-10-CM | POA: Insufficient documentation

## 2014-12-18 DIAGNOSIS — J45909 Unspecified asthma, uncomplicated: Secondary | ICD-10-CM | POA: Diagnosis not present

## 2014-12-18 DIAGNOSIS — I10 Essential (primary) hypertension: Secondary | ICD-10-CM | POA: Insufficient documentation

## 2014-12-18 DIAGNOSIS — R52 Pain, unspecified: Secondary | ICD-10-CM

## 2014-12-18 DIAGNOSIS — S86812A Strain of other muscle(s) and tendon(s) at lower leg level, left leg, initial encounter: Secondary | ICD-10-CM | POA: Diagnosis not present

## 2014-12-18 DIAGNOSIS — Y93E1 Activity, personal bathing and showering: Secondary | ICD-10-CM | POA: Diagnosis not present

## 2014-12-18 DIAGNOSIS — Z880 Allergy status to penicillin: Secondary | ICD-10-CM | POA: Diagnosis not present

## 2014-12-18 DIAGNOSIS — Z79899 Other long term (current) drug therapy: Secondary | ICD-10-CM | POA: Insufficient documentation

## 2014-12-18 DIAGNOSIS — Z8701 Personal history of pneumonia (recurrent): Secondary | ICD-10-CM | POA: Diagnosis not present

## 2014-12-18 DIAGNOSIS — Z7902 Long term (current) use of antithrombotics/antiplatelets: Secondary | ICD-10-CM | POA: Diagnosis not present

## 2014-12-18 DIAGNOSIS — S86112A Strain of other muscle(s) and tendon(s) of posterior muscle group at lower leg level, left leg, initial encounter: Secondary | ICD-10-CM

## 2014-12-18 DIAGNOSIS — I4891 Unspecified atrial fibrillation: Secondary | ICD-10-CM | POA: Diagnosis not present

## 2014-12-18 DIAGNOSIS — Z8739 Personal history of other diseases of the musculoskeletal system and connective tissue: Secondary | ICD-10-CM | POA: Insufficient documentation

## 2014-12-18 LAB — BASIC METABOLIC PANEL
Anion gap: 10 (ref 5–15)
BUN: 14 mg/dL (ref 6–23)
CHLORIDE: 103 mmol/L (ref 96–112)
CO2: 25 mmol/L (ref 19–32)
Calcium: 8.7 mg/dL (ref 8.4–10.5)
Creatinine, Ser: 1.31 mg/dL (ref 0.50–1.35)
GFR calc Af Amer: 54 mL/min — ABNORMAL LOW (ref >90)
GFR calc non Af Amer: 47 mL/min — ABNORMAL LOW (ref >90)
GLUCOSE: 126 mg/dL — AB (ref 70–99)
POTASSIUM: 4 mmol/L (ref 3.5–5.1)
Sodium: 138 mmol/L (ref 135–145)

## 2014-12-18 LAB — CBC WITH DIFFERENTIAL/PLATELET
BASOS ABS: 0.1 10*3/uL (ref 0.0–0.1)
BASOS PCT: 1 % (ref 0–1)
EOS PCT: 1 % (ref 0–5)
Eosinophils Absolute: 0.1 10*3/uL (ref 0.0–0.7)
HEMATOCRIT: 36.4 % — AB (ref 39.0–52.0)
Hemoglobin: 12.3 g/dL — ABNORMAL LOW (ref 13.0–17.0)
LYMPHS PCT: 11 % — AB (ref 12–46)
Lymphs Abs: 1.3 10*3/uL (ref 0.7–4.0)
MCH: 28.8 pg (ref 26.0–34.0)
MCHC: 33.8 g/dL (ref 30.0–36.0)
MCV: 85.2 fL (ref 78.0–100.0)
MONO ABS: 1.9 10*3/uL — AB (ref 0.1–1.0)
Monocytes Relative: 18 % — ABNORMAL HIGH (ref 3–12)
Neutro Abs: 7.7 10*3/uL (ref 1.7–7.7)
Neutrophils Relative %: 69 % (ref 43–77)
Platelets: 149 10*3/uL — ABNORMAL LOW (ref 150–400)
RBC: 4.27 MIL/uL (ref 4.22–5.81)
RDW: 13.9 % (ref 11.5–15.5)
WBC: 11 10*3/uL — ABNORMAL HIGH (ref 4.0–10.5)

## 2014-12-18 LAB — PROTIME-INR
INR: 1.55 — ABNORMAL HIGH (ref 0.00–1.49)
PROTHROMBIN TIME: 18.7 s — AB (ref 11.6–15.2)

## 2014-12-18 NOTE — ED Provider Notes (Signed)
CSN: [id number]     Arrival date & time 12/18/14  1334 History   First MD Initiated Contact with Patient 12/18/14 1409     Chief Complaint  Patient presents with  . Leg Pain    LEFT      HPI  Pt presents tonight with some left leg pain. States he was in the shower yesterday at his care facility when he slipped. He states his providers were with him that he put weight on his left leg and twisted it. Has had pain in the posterior aspect of the leg since that time.   The neck is recent history includes Hospital admission for CVA, right Basal Ganglia.  Has some mild left-sided weakness it persists since that time.   Continues on Pradaxa for paroxysmal A. fib.   Past Medical History  Diagnosis Date  . Hypertension   . Arthritis   . Hernia   . Dysrhythmia     atrial fibrillation  . Head injury   . Humerus fracture     right  . Asthma     as a child  . Pneumonia   . Stroke     "light stroke"  . H/O hiatal hernia   . Stroke   . A-fib   . Back pain    Past Surgical History  Procedure Laterality Date  . Finger amputation Left     little  . Orif humerus fracture Right 09/15/2013    Procedure: OPEN REDUCTION INTERNAL FIXATION (ORIF) RIGHT HUMERAL SHAFT FRACTURE;  Surgeon: Cheral Almas, MD;  Location: [facility] OR;  Service: Orthopedics;  Laterality: Right;   Family History  Problem Relation Age of Onset  . Heart attack Brother    History  Substance Use Topics  . Smoking status: Never Smoker   . Smokeless tobacco: Former Neurosurgeon    Types: Chew  . Alcohol Use: Yes     Comment: heavy drinker    Review of Systems  Constitutional: Negative for fever, chills, diaphoresis, appetite change and fatigue.  HENT: Negative for mouth sores, sore throat and trouble swallowing.   Eyes: Negative for visual disturbance.  Respiratory: Negative for cough, chest tightness, shortness of breath and wheezing.   Cardiovascular: Negative for chest pain.  Gastrointestinal: Negative for nausea,  vomiting, abdominal pain, diarrhea and abdominal distention.  Endocrine: Negative for polydipsia, polyphagia and polyuria.  Genitourinary: Negative for dysuria, frequency and hematuria.  Musculoskeletal: Negative for gait problem.       Left lower leg pain.  Skin: Negative for color change, pallor and rash.  Neurological: Negative for dizziness, syncope, light-headedness and headaches.  Hematological: Does not bruise/bleed easily.  Psychiatric/Behavioral: Negative for behavioral problems and confusion.      Allergies  Hydrocodone; Penicillins; and Tetanus toxoids  Home Medications   Prior to Admission medications   Medication Sig Start Date End Date Taking? Authorizing Provider  amLODipine (NORVASC) 10 MG tablet Take 10 mg by mouth daily.   Yes Historical Provider, MD  apixaban (ELIQUIS) 5 MG TABS tablet Take 1 tablet (5 mg total) by mouth 2 (two) times daily. 12/14/14  Yes Richarda Overlie, MD  atorvastatin (LIPITOR) 40 MG tablet Take 1 tablet (40 mg total) by mouth daily at 6 PM. 12/14/14  Yes Richarda Overlie, MD  levofloxacin (LEVAQUIN) 250 MG tablet Take 1 tablet (250 mg total) by mouth at bedtime. 12/14/14  Yes Richarda Overlie, MD  metoprolol (LOPRESSOR) 50 MG tablet Take 0.5 tablets (25 mg total) by mouth daily. 12/14/14  Yes  Richarda OverlieNayana Abrol, MD  traMADol (ULTRAM) 50 MG tablet Take 1 tablet (50 mg total) by mouth every 6 (six) hours as needed. 12/14/14  Yes Richarda OverlieNayana Abrol, MD  vitamin B-12 1000 MCG tablet Take 1 tablet (1,000 mcg total) by mouth daily. Patient not taking: Reported on 12/18/2014 12/14/14   Richarda OverlieNayana Abrol, MD   BP 120/55 mmHg  Pulse 88  Temp(Src) 97.6 F (36.4 C) (Oral)  Resp 18  SpO2 97% Physical Exam  Constitutional: He is oriented to person, place, and time. He appears well-developed and well-nourished. No distress.  HENT:  Head: Normocephalic.  Eyes: Conjunctivae are normal. Pupils are equal, round, and reactive to light. No scleral icterus.  Neck: Normal range of motion.  Neck supple. No thyromegaly present.  Cardiovascular: Normal rate and regular rhythm.  Exam reveals no gallop and no friction rub.   No murmur heard. Pulmonary/Chest: Effort normal and breath sounds normal. No respiratory distress. He has no wheezes. He has no rales.  Abdominal: Soft. Bowel sounds are normal. He exhibits no distension. There is no tenderness. There is no rebound.  Musculoskeletal: Normal range of motion.       Legs: Neurological: He is alert and oriented to person, place, and time.  Skin: Skin is warm and dry. No rash noted.  Psychiatric: He has a normal mood and affect. His behavior is normal.    ED Course  Procedures (including critical care time) Labs Review Labs Reviewed  CBC WITH DIFFERENTIAL/PLATELET - Abnormal; Notable for the following:    WBC 11.0 (*)    Hemoglobin 12.3 (*)    HCT 36.4 (*)    Platelets 149 (*)    Lymphocytes Relative 11 (*)    Monocytes Relative 18 (*)    Monocytes Absolute 1.9 (*)    All other components within normal limits  PROTIME-INR - Abnormal; Notable for the following:    Prothrombin Time 18.7 (*)    INR 1.55 (*)    All other components within normal limits  BASIC METABOLIC PANEL    Imaging Review No results found.   EKG Interpretation None      MDM   Final diagnoses:  Pain  TIA (transient ischemic attack)  Gastrocnemius muscle tear, left, initial encounter    X-rays from his facility showed no abnormality of the ankle and knee hips.  Ultrasound shows no clot, but shows fluid collection in the mid calf medially. Diagnosis is probable gastrocnemius muscle tear,, or plantaris muscle/tendon tear.  Plan will be symptomatic treatment. Ace wrap. Measure and recheck visually the left leg each day. Elevate as much as possible. Nonweightbearing. Orthopedic follow-up.   Rolland PorterMark Efe, MD 01/01/15 [phone]

## 2014-12-18 NOTE — ED Notes (Signed)
Per EMS: pt c/o left leg pain right above, slight swelling to area. Left foot appears to be shorter than right

## 2014-12-18 NOTE — Discharge Instructions (Signed)
[name] has a tear of the muscle in his left calf.  He should not bear weight on this leg until seen in follow-up evaluation with orthopedics.  Measure the leg and visualized the leg each day to ensure that is not increasing in size.  If his pain becomes suddenly worse in the leg or if he has a change in appearance of the foot (pale). Or temperature (cool), then recheck here  Elevate the left lower extremity as much as possible.  Do NOT give his Pradaxa for the next 48 hours, then resume regular dosage.

## 2014-12-18 NOTE — ED Provider Notes (Signed)
  Physical Exam  BP 120/55 mmHg  Pulse 88  Temp(Src) 97.6 F (36.4 C) (Oral)  Resp 18  SpO2 97%  Physical Exam  ED Course  Procedures  MDM Head CT reassuring. Will discharge home.      Benjiman CoreNathan Karlene Southard, MD 12/18/14 [phone]

## 2014-12-18 NOTE — ED Notes (Signed)
Pt's calf is 37cm in circumference around largest point.

## 2014-12-18 NOTE — Progress Notes (Signed)
*  PRELIMINARY RESULTS* Vascular Ultrasound Left lower extremity venous duplex has been completed.  Preliminary findings: Negative for DVT. There is a large area of mixed echoes in the proximal to mid left calf, possibly a muscle tear.   Farrel DemarkJill Eunice, RDMS, RVT  12/18/2014, 2:55 PM

## 2014-12-31 ENCOUNTER — Ambulatory Visit (HOSPITAL_COMMUNITY)
Admission: RE | Admit: 2014-12-31 | Discharge: 2014-12-31 | Disposition: A | Discharge status: Home / Self Care | Payer: Medicare Other | Source: Ambulatory Visit | Attending: Cardiology | Admitting: Cardiology

## 2014-12-31 ENCOUNTER — Other Ambulatory Visit (HOSPITAL_COMMUNITY): Payer: Self-pay | Admitting: Sports Medicine

## 2014-12-31 DIAGNOSIS — X58XXXA Exposure to other specified factors, initial encounter: Secondary | ICD-10-CM | POA: Diagnosis not present

## 2014-12-31 DIAGNOSIS — M79609 Pain in unspecified limb: Secondary | ICD-10-CM

## 2014-12-31 DIAGNOSIS — S8012XA Contusion of left lower leg, initial encounter: Secondary | ICD-10-CM | POA: Insufficient documentation

## 2014-12-31 DIAGNOSIS — M79605 Pain in left leg: Secondary | ICD-10-CM | POA: Diagnosis present

## 2014-12-31 NOTE — Progress Notes (Signed)
Left lower extremity venous duplex completed. No evidence for DVT, SVT, or Baker's cyst. There is evidence for a large hematoma in the left calf status post injury, measuring 7.0 x 3.5 centimeters. Brianna L Mazza,RVT

## 2015-01-11 ENCOUNTER — Telehealth (HOSPITAL_COMMUNITY): Payer: Self-pay | Admitting: *Deleted

## 2015-02-08 ENCOUNTER — Encounter: Payer: Self-pay | Admitting: Neurology

## 2015-02-08 ENCOUNTER — Ambulatory Visit (INDEPENDENT_AMBULATORY_CARE_PROVIDER_SITE_OTHER): Payer: Medicare Other | Admitting: Neurology

## 2015-02-08 VITALS — BP 121/65 | HR 46 | Wt 149.0 lb

## 2015-02-08 DIAGNOSIS — I482 Chronic atrial fibrillation, unspecified: Secondary | ICD-10-CM

## 2015-02-08 DIAGNOSIS — S8010XA Contusion of unspecified lower leg, initial encounter: Secondary | ICD-10-CM | POA: Insufficient documentation

## 2015-02-08 DIAGNOSIS — I10 Essential (primary) hypertension: Secondary | ICD-10-CM | POA: Diagnosis not present

## 2015-02-08 DIAGNOSIS — Z7901 Long term (current) use of anticoagulants: Secondary | ICD-10-CM | POA: Diagnosis not present

## 2015-02-08 DIAGNOSIS — I63411 Cerebral infarction due to embolism of right middle cerebral artery: Secondary | ICD-10-CM | POA: Diagnosis not present

## 2015-02-08 DIAGNOSIS — S8012XS Contusion of left lower leg, sequela: Secondary | ICD-10-CM

## 2015-02-08 DIAGNOSIS — Z8679 Personal history of other diseases of the circulatory system: Secondary | ICD-10-CM

## 2015-02-08 DIAGNOSIS — R319 Hematuria, unspecified: Secondary | ICD-10-CM

## 2015-02-08 HISTORY — DX: Cerebral infarction due to embolism of right middle cerebral artery: I63.411

## 2015-02-08 NOTE — Progress Notes (Signed)
STROKE NEUROLOGY FOLLOW UP NOTE  NAME: Jerry [name] DOB: 11/20/1923  REASON FOR VISIT: stroke follow up HISTORY FROM: NH nurse and chart  Today we had the pleasure of seeing Jerry [name] in follow-up at our [facility]. Pt was accompanied by caregiver in NH.   History Summary Jerry [name] is a 81 y.o. [name] with history of traumatic [name] injury, hypertension, atrial fibrillation on Pradaxa, recent hematuria, and history of a subarachnoid hemorrhage and subdural hematoma after falling, NH resident presenting with AMS and decreased responsiveness on 12/11/14. MRI showed right basal ganglia large infarct, concerning for embolic cause. However his PTT and INR indicating Jerry is taking and responding to Pradaxa. 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 Given history of SDH and SAH, switched Pradaxa to Eliquis for stroke prevention. No addition of ASA due to history of SAH and SDH. LDL was high, and Jerry was put on Lipitor. B-12 was low, put on B-12 supplement.    Interval History During the interval time, the patient has been doing fine. Pt is poor historian and did not provide much of history. I called nurse in NH to get more information. While on eliquis, Jerry developed left calf hematoma which confirmed on LE doppler x 2. His eliquis was changed to plavix and then now switched back to eliquis. Jerry has not have repeated LE doppler yet. As per NH nurse over the phone today, Jerry has not have any hematuria so far.  His BP 121/65.   REVIEW OF SYSTEMS: Full 14 system review of systems performed and notable only for those listed below and in HPI above, all others are negative:  Constitutional:   Cardiovascular:  Ear/Nose/Throat:   Skin:  Eyes:   Respiratory:   Gastroitestinal:   Genitourinary:  Hematology/Lymphatic:   Endocrine:  Musculoskeletal:  Left leg "lock up" Allergy/Immunology:   Neurological:   Psychiatric:  Sleep: daytime sleepiness  The following represents the patient's  updated allergies and side effects list: Allergies  Allergen Reactions  . Hydrocodone Other (See Comments)    Reaction unknown  . Penicillins Other (See Comments)    REACTION: unknown  . Tetanus Toxoids Other (See Comments)    unknown    The neurologically relevant items on the patient's problem list were reviewed on today's visit.  Neurologic Examination  A problem focused neurological exam (12 or more points of the single system neurologic examination, vital signs counts as 1 point, cranial nerves count for 8 points) was performed.  Blood pressure 121/65, pulse 46, weight 149 lb (67.586 kg).  General - Well nourished, well developed, in no apparent distress, psychomotor slowing.  Ophthalmologic - Fundi not visualized due to eye movement.  Cardiovascular - irregularly irregular heart rate and rhythm.  Mental Status -  Awake, alert, orientated to place, but not to time or person. Language including expression, naming, repetition, comprehension was assessed and found intact. Recent and remote memory were 3/3 registration and 0/3 delayed recall. Fund of Knowledge was assessed and was impaired. Psychomotor slowing.   Cranial Nerves II - XII - II - Visual field intact OU. III, IV, VI - Extraocular movements intact. V - Facial sensation intact bilaterally. VII - Facial movement intact bilaterally, right nasolabial fold flattening comparing with left. VIII - Hearing & vestibular intact bilaterally. X - Palate elevates symmetrically. XI - Chin turning & shoulder shrug intact bilaterally. XII - Tongue protrusion intact.  Motor Strength - The patient's strength was 4+/5 in all extremities and pronator  drift was absent.  Bulk was normal and fasciculations were absent.   Motor Tone - Muscle tone was assessed at the neck and appendages and was normal.  Reflexes - The patient's reflexes were 1+ in all extremities and Jerry had no pathological reflexes.  Sensory - Light touch,  temperature/pinprick were assessed and were normal.    Coordination - The patient had normal movements in the hands with no ataxia or dysmetria.  Tremor was absent.  Gait and Station - not tested, in wheelchair.  Data reviewed: I personally reviewed the images and agree with the radiology interpretations.  Ct Head Wo Contrast 12/11/2014  Negative for acute hemorrhage. New low density in the upper right basal ganglia and extending into the right corona radiata. Findings are compatible with an infarct of uncertain age. The infarct could be acute or subacute. Cerebral atrophy with evidence of chronic small vessel ischemic changes. Old insult in the right temporal lobe.   Jerry [name] Wo Contrast 12/12/2014  MRI HEAD:  Acute large RIGHT basal ganglia acute infarct corresponding to CT abnormality.  Small bilateral chronic holo hemispheric subdural hematomas.  Bifrontal hemosiderin staining consistent with remote subarachnoid hemorrhage.  Multifocal encephalomalacia may be posttraumatic or ischemic.  Moderate to severe white matter changes suggest chronic small vessel ischemic disease.   MRA HEAD:  Moderately motion degraded examination.  At least mid grade stenosis the RIGHT V4 segment.  Near complete loss of mid basilar flow related enhancement which may reflect high-grade stenosis and/or artifact.  Complete circle of Willis. No large vessel occlusion, limited assessment of the mid to distal cerebral arteries due to motion. Findings would be better characterized on CTA of the head, as patient had difficulty remaining still for MRA.   Dg Chest Port 1 View 12/11/2014  No acute cardiopulmonary disease.   CTA of Head and Neck 12/13/2014 The patient was unable to remain motionless for the exam. Small or subtle lesions could be overlooked.  No proximal anterior circulation stenosis or large vessel occlusion. Estimated 50% stenosis RIGHT V4 segment vertebral (non  dominant vessel), and estimated 50-75% stenosis of the mid basilar artery.  2D echo - Left ventricle: Systolic function was mildly reduced. The estimated ejection fraction was in the range of 45% to 50%. Wall motion was normal; there were no regional wall motion abnormalities. - Aortic valve: There was trivial regurgitation. - Left atrium: The atrium was moderately dilated. - Right atrium: The atrium was moderately dilated.  EEG - This is a normal routine EEG of the mostly drowsy state, without activating procedures. A normal study does not rule out the possibility of a seizure disorder in this patient.  Venous doppler of LE -  12/18/14 - No evidence of deep vein thrombosis involving the left lower extremity. - There is an area of mixed echoes in the proximal to mid left calf, consistent with a muscle tear.  12/31/14 - No evidence for DVT, SVT, or Baker's cyst. There is evidence for a large hematoma in the left calf status post injury, measuring 7.0 x 3.5 centimeters  Component     Latest Ref Rng 12/11/2014 12/12/2014  Cholesterol     0 - 200 mg/dL  696170  Triglycerides     <150 mg/dL  90  HDL Cholesterol     >39 mg/dL  28 (L)  Total CHOL/HDL Ratio       6.1  VLDL     0 - 40 mg/dL  18  LDL (calc)  0 - 99 mg/dL  161124 (H)  Hemoglobin [id number]     4.8 - 5.6 %  4.9  Mean Plasma Glucose       94  Vitamin B-12     211 - 911 pg/mL 292   TSH     0.350 - 4.500 uIU/mL 2.243   Ammonia     11 - 32 umol/L 11     Assessment: As you may recall, Jerry is a 81 y.o. Caucasian [name] with PMH of traumatic [name] injury, hypertension, atrial fibrillation on Pradaxa, recent hematuria, and history of a subarachnoid hemorrhage and subdural hematoma after falling, NH resident admitted on 12/11/14 for right basal ganglia large infarct, concerning for embolic cause. However his PTT and INR indicating Jerry is taking and responding to Pradaxa. 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 Given history of SDH and  SAH, switched Pradaxa to Eliquis for stroke prevention. No addition of ASA due to history of SAH and SDH. Discharged on eliquis, lipitor and B12. During interval time, developed left calf hematoma, eliquis was switched to plavix temporarily and now back to eliquis. So far left leg no swelling with just mild tenderness at left calf. Hematuria resolved. Continue eliquis and recommend repeat LE doppler.   Plan:  - continue eliquis and lipitor for stroke prevention - bleeding precautions - check BP at NH - Follow up with your primary care physician for stroke risk factor modification. Recommend maintain blood pressure goal <130/80, diabetes with hemoglobin A1c goal below 6.5% and lipids with LDL cholesterol goal below 70 mg/dL.  - recommend to repeat left LE ultrasound to document resolution of left LE hematoma - continue PT/OT in NH - RTC in 4 months.  No orders of the defined types were placed in this encounter.    No orders of the defined types were placed in this encounter.    Patient Instructions  - continue eliquis and lipitor for stroke prevention - bleeding precautions - check BP at NH - Follow up with your primary care physician for stroke risk factor modification. Recommend maintain blood pressure goal <130/80, diabetes with hemoglobin A1c goal below 6.5% and lipids with LDL cholesterol goal below 70 mg/dL.  - recommend to repeat left LE ultrasound to document resolution of left LE hematoma - continue PT/OT in NH - RTC in 4 months.   Marvel PlanJindong Deklen Popelka, MD PhD [facility] [address], [address] [place], [place] [id number] [phone]

## 2015-02-08 NOTE — Patient Instructions (Addendum)
-   continue eliquis and lipitor for stroke prevention - bleeding precautions - check BP at NH - Follow up with your primary care physician for stroke risk factor modification. Recommend maintain blood pressure goal <130/80, diabetes with hemoglobin A1c goal below 6.5% and lipids with LDL cholesterol goal below 70 mg/dL.  - recommend to repeat left LE ultrasound to document resolution of left LE hematoma - continue PT/OT in NH - RTC in 4 months.

## 2015-02-14 ENCOUNTER — Encounter (HOSPITAL_COMMUNITY): Payer: Self-pay | Admitting: Emergency Medicine

## 2015-02-14 ENCOUNTER — Emergency Department (HOSPITAL_COMMUNITY): Payer: Medicare Other

## 2015-02-14 ENCOUNTER — Emergency Department (HOSPITAL_COMMUNITY)
Admission: EM | Admit: 2015-02-14 | Discharge: 2015-02-15 | Disposition: A | Discharge status: Home / Self Care | Payer: Medicare Other | Attending: Emergency Medicine | Admitting: Emergency Medicine

## 2015-02-14 DIAGNOSIS — Y939 Activity, unspecified: Secondary | ICD-10-CM | POA: Diagnosis not present

## 2015-02-14 DIAGNOSIS — Z8781 Personal history of (healed) traumatic fracture: Secondary | ICD-10-CM | POA: Diagnosis not present

## 2015-02-14 DIAGNOSIS — S61215A Laceration without foreign body of left ring finger without damage to nail, initial encounter: Secondary | ICD-10-CM | POA: Insufficient documentation

## 2015-02-14 DIAGNOSIS — J45909 Unspecified asthma, uncomplicated: Secondary | ICD-10-CM | POA: Diagnosis not present

## 2015-02-14 DIAGNOSIS — S6992XA Unspecified injury of left wrist, hand and finger(s), initial encounter: Secondary | ICD-10-CM | POA: Diagnosis present

## 2015-02-14 DIAGNOSIS — Z8719 Personal history of other diseases of the digestive system: Secondary | ICD-10-CM | POA: Diagnosis not present

## 2015-02-14 DIAGNOSIS — Z880 Allergy status to penicillin: Secondary | ICD-10-CM | POA: Insufficient documentation

## 2015-02-14 DIAGNOSIS — W230XXA Caught, crushed, jammed, or pinched between moving objects, initial encounter: Secondary | ICD-10-CM | POA: Insufficient documentation

## 2015-02-14 DIAGNOSIS — S61412A Laceration without foreign body of left hand, initial encounter: Secondary | ICD-10-CM

## 2015-02-14 DIAGNOSIS — Z8701 Personal history of pneumonia (recurrent): Secondary | ICD-10-CM | POA: Diagnosis not present

## 2015-02-14 DIAGNOSIS — S0990XA Unspecified injury of head, initial encounter: Secondary | ICD-10-CM | POA: Diagnosis not present

## 2015-02-14 DIAGNOSIS — Z79899 Other long term (current) drug therapy: Secondary | ICD-10-CM | POA: Insufficient documentation

## 2015-02-14 DIAGNOSIS — Z8739 Personal history of other diseases of the musculoskeletal system and connective tissue: Secondary | ICD-10-CM | POA: Insufficient documentation

## 2015-02-14 DIAGNOSIS — W19XXXA Unspecified fall, initial encounter: Secondary | ICD-10-CM

## 2015-02-14 DIAGNOSIS — Y999 Unspecified external cause status: Secondary | ICD-10-CM | POA: Diagnosis not present

## 2015-02-14 DIAGNOSIS — I10 Essential (primary) hypertension: Secondary | ICD-10-CM | POA: Diagnosis not present

## 2015-02-14 DIAGNOSIS — Y929 Unspecified place or not applicable: Secondary | ICD-10-CM | POA: Diagnosis not present

## 2015-02-14 DIAGNOSIS — Z8673 Personal history of transient ischemic attack (TIA), and cerebral infarction without residual deficits: Secondary | ICD-10-CM | POA: Insufficient documentation

## 2015-02-14 LAB — BASIC METABOLIC PANEL
ANION GAP: 8 (ref 5–15)
BUN: 17 mg/dL (ref 6–20)
CO2: 25 mmol/L (ref 22–32)
CREATININE: 1.24 mg/dL (ref 0.61–1.24)
Calcium: 8.7 mg/dL — ABNORMAL LOW (ref 8.9–10.3)
Chloride: 100 mmol/L — ABNORMAL LOW (ref 101–111)
GFR calc non Af Amer: 50 mL/min — ABNORMAL LOW (ref >60)
GFR, EST AFRICAN AMERICAN: 58 mL/min — AB (ref >60)
GLUCOSE: 103 mg/dL — AB (ref 65–99)
Potassium: 3.6 mmol/L (ref 3.5–5.1)
SODIUM: 133 mmol/L — AB (ref 135–145)

## 2015-02-14 LAB — CBC WITH DIFFERENTIAL/PLATELET
Basophils Absolute: 0 10*3/uL (ref 0.0–0.1)
Basophils Relative: 0 % (ref 0–1)
EOS PCT: 1 % (ref 0–5)
Eosinophils Absolute: 0.1 10*3/uL (ref 0.0–0.7)
HCT: 41.1 % (ref 39.0–52.0)
HEMOGLOBIN: 13.8 g/dL (ref 13.0–17.0)
LYMPHS ABS: 1.4 10*3/uL (ref 0.7–4.0)
Lymphocytes Relative: 13 % (ref 12–46)
MCH: 27.5 pg (ref 26.0–34.0)
MCHC: 33.6 g/dL (ref 30.0–36.0)
MCV: 82 fL (ref 78.0–100.0)
Monocytes Absolute: 1.5 10*3/uL — ABNORMAL HIGH (ref 0.1–1.0)
Monocytes Relative: 13 % — ABNORMAL HIGH (ref 3–12)
NEUTROS PCT: 73 % (ref 43–77)
Neutro Abs: 8 10*3/uL — ABNORMAL HIGH (ref 1.7–7.7)
Platelets: 134 10*3/uL — ABNORMAL LOW (ref 150–400)
RBC: 5.01 MIL/uL (ref 4.22–5.81)
RDW: 13.5 % (ref 11.5–15.5)
WBC: 11 10*3/uL — AB (ref 4.0–10.5)

## 2015-02-14 LAB — PROTIME-INR
INR: 1.72 — ABNORMAL HIGH (ref 0.00–1.49)
PROTHROMBIN TIME: 20.2 s — AB (ref 11.6–15.2)

## 2015-02-14 LAB — TROPONIN I: Troponin I: <0.03 ng/mL (ref <0.031)

## 2015-02-14 MED ORDER — BUPIVACAINE HCL (PF) 0.5 % IJ SOLN
10.0000 mL | Freq: Once | INTRAMUSCULAR | Status: DC
  Filled 2015-02-14: qty 10

## 2015-02-14 NOTE — ED Notes (Signed)
Flap-typ 1" 1"  To the base of his lt  Ring finger.  Small amount of bleeding from the laceration.  Cleaned with soap and water.  He struck it against the bed metal causing the cut

## 2015-02-14 NOTE — ED Notes (Signed)
Pt walked in hallway with assistance from this EMT.

## 2015-02-14 NOTE — ED Provider Notes (Signed)
CSN: [id number]     Arrival date & time 02/14/15  1834 History   First MD Initiated Contact with Patient 02/14/15 1917     Chief Complaint  Patient presents with  . Finger Injury     (Consider location/radiation/quality/duration/timing/severity/associated sxs/prior Treatment) HPI Comments: Patient with dementia. States he was getting out of bed and his finger got caught on the bed rail. He fell onto the ground striking his head. Denies losing consciousness. Has a laceration to his left ring finger. Patient takes Eliquis for history of stroke. He denies any headache. He complains of some neck pain. No chest pain, shortness of breath, abdominal pain or back pain. Complains of pain to right wrist and left ring finger. Denies any preceding dizziness or lightheadedness. Patient with history of traumatic subdural subarachnoid. Also has ischemic stroke in March 2016.  The history is provided by the patient and the EMS personnel. The history is limited by the condition of the patient.    Past Medical History  Diagnosis Date  . Hypertension   . Arthritis   . Hernia   . Dysrhythmia     atrial fibrillation  . Head injury   . Humerus fracture     right  . Asthma     as a child  . Pneumonia   . Stroke     "light stroke"  . H/O hiatal hernia   . Stroke 2016  . A-fib   . Back pain    Past Surgical History  Procedure Laterality Date  . Finger amputation Left     little  . Orif humerus fracture Right 09/15/2013    Procedure: OPEN REDUCTION INTERNAL FIXATION (ORIF) RIGHT HUMERAL SHAFT FRACTURE;  Surgeon: Cheral Almas, MD;  Location: [facility] OR;  Service: Orthopedics;  Laterality: Right;   Family History  Problem Relation Age of Onset  . Heart attack Brother    History  Substance Use Topics  . Smoking status: Never Smoker   . Smokeless tobacco: Former Neurosurgeon    Types: Chew  . Alcohol Use: Yes     Comment: heavy drinker    Review of Systems  Constitutional: Negative for fever,  activity change and appetite change.  Respiratory: Negative for chest tightness and shortness of breath.   Cardiovascular: Negative for chest pain.  Gastrointestinal: Negative for nausea, vomiting and abdominal pain.  Genitourinary: Negative for decreased urine volume.  Musculoskeletal: Positive for myalgias and arthralgias.  Skin: Positive for wound.  Neurological: Positive for headaches. Negative for dizziness and light-headedness.  A complete 10 system review of systems was obtained and all systems are negative except as noted in the HPI and PMH.      Allergies  Hydrocodone; Penicillins; and Tetanus toxoids  Home Medications   Prior to Admission medications   Medication Sig Start Date End Date Taking? Authorizing Provider  amLODipine (NORVASC) 10 MG tablet Take 10 mg by mouth daily.    Historical Provider, MD  apixaban (ELIQUIS) 5 MG TABS tablet Take 1 tablet (5 mg total) by mouth 2 (two) times daily. 12/14/14   Richarda Overlie, MD  atorvastatin (LIPITOR) 40 MG tablet Take 1 tablet (40 mg total) by mouth daily at 6 PM. 12/14/14   Richarda Overlie, MD  metoprolol (LOPRESSOR) 50 MG tablet Take 0.5 tablets (25 mg total) by mouth daily. 12/14/14   Richarda Overlie, MD  traMADol (ULTRAM) 50 MG tablet Take 1 tablet (50 mg total) by mouth every 6 (six) hours as needed. 12/14/14   Richarda Overlie,  MD  vitamin B-12 1000 MCG tablet Take 1 tablet (1,000 mcg total) by mouth daily. 12/14/14   Richarda Overlie, MD   BP 128/70 mmHg  Pulse 97  Temp(Src) 98.7 F (37.1 C) (Oral)  Resp 16  SpO2 99% Physical Exam  Constitutional: He is oriented to person, place, and time. He appears well-developed and well-nourished. No distress.  HENT:  Head: Normocephalic and atraumatic.  Mouth/Throat: Oropharynx is clear and moist. No oropharyngeal exudate.  No hematoma  Eyes: Conjunctivae and EOM are normal. Pupils are equal, round, and reactive to light.  Neck: Normal range of motion. Neck supple.  No C spine tenderness    Cardiovascular: Normal rate, regular rhythm, normal heart sounds and intact distal pulses.   No murmur heard. Pulmonary/Chest: Effort normal and breath sounds normal. No respiratory distress.  Abdominal: Soft. There is no tenderness. There is no rebound and no guarding.  Musculoskeletal: Normal range of motion. He exhibits no edema or tenderness.  No T or L spine tenderness U-shaped laceration to base of the left fourth finger. Previous third finger amputation. Intact flexion and extension at MCP joint. Intact flexion and extension at PIP and DIP joint. Intact radial pulse Tenderness to right wrist without deformity  Neurological: He is alert and oriented to person, place, and time. No cranial nerve deficit. He exhibits normal muscle tone. Coordination normal.  No ataxia on finger to nose bilaterally. No pronator drift. 5/5 strength throughout. CN 2-12 intact.Equal grip strength. Sensation intact. Gait is normal.   Skin: Skin is warm.  Psychiatric: He has a normal mood and affect. His behavior is normal.  Nursing note and vitals reviewed.     ED Course  Procedures (including critical care time) Labs Review Labs Reviewed  CBC WITH DIFFERENTIAL/PLATELET - Abnormal; Notable for the following:    WBC 11.0 (*)    Platelets 134 (*)    Neutro Abs 8.0 (*)    Monocytes Relative 13 (*)    Monocytes Absolute 1.5 (*)    All other components within normal limits  BASIC METABOLIC PANEL - Abnormal; Notable for the following:    Sodium 133 (*)    Chloride 100 (*)    Glucose, Bld 103 (*)    Calcium 8.7 (*)    GFR calc non Af Amer 50 (*)    GFR calc Af Amer 58 (*)    All other components within normal limits  PROTIME-INR - Abnormal; Notable for the following:    Prothrombin Time 20.2 (*)    INR 1.72 (*)    All other components within normal limits  TROPONIN I    Imaging Review Dg Chest 2 View  02/14/2015   CLINICAL DATA:  Patient with injury to the finger. No chest complaints.  EXAM:  CHEST  2 VIEW  COMPARISON:  Chest radiograph 12/11/2014  FINDINGS: Stable enlarged cardiac and mediastinal contours. No consolidative pulmonary opacities. Calcified granuloma left upper lung. Mid thoracic spine degenerative changes. Plate and screw hardware proximal right humerus.  IMPRESSION: No acute cardiopulmonary process.   Electronically Signed   By: Annia Belt M.D.   On: 02/14/2015 21:05   Dg Wrist Complete Right  02/14/2015   CLINICAL DATA:  Right wrist pain.  EXAM: RIGHT WRIST - COMPLETE 3+ VIEW  COMPARISON:  None.  FINDINGS: No fracture or dislocation. There is degenerative change involving the scapholunate articulation. No definite erosions. No evidence of chondrocalcinosis. Adjacent vascular calcifications. No definite displacement of the pronator quadratus fat pad. Regional soft tissues  appear normal. There is a punctate (approximately 3 mm) ossicle within the soft tissues of the imaged mid aspect of the palm. No associated subcutaneous emphysema. No radiopaque foreign body.  IMPRESSION: 1. No acute findings. 2. Degenerative change involving the scapholunate articulation, presumably degenerative in etiology though could be seen in the setting of an inflammatory arthropathy. Clinical correlation is advised. 3. Punctate (approximately 3 mm) ossicle within the subcutaneous tissues of the palm without associated subcutaneous emphysema. Clinical correlation is advised.   Electronically Signed   By: Simonne Come M.D.   On: 02/14/2015 21:09   Ct Head Wo Contrast  02/14/2015   CLINICAL DATA:  Post fall hitting back of head. Patient on blood thinners.  EXAM: CT HEAD WITHOUT CONTRAST  CT CERVICAL SPINE WITHOUT CONTRAST  TECHNIQUE: Multidetector CT imaging of the head and cervical spine was performed following the standard protocol without intravenous contrast. Multiplanar CT image reconstructions of the cervical spine were also generated.  COMPARISON:  12/18/2014  FINDINGS: CT HEAD FINDINGS  Regional soft  tissues appear normal with special attention paid to the posterior aspect of the calvarium.  Similar findings of advanced atrophy with sulcal prominence and centralized volume loss with commensurate ex vacuo dilatation of the ventricular system. Stable sequela of prior infarction involving the anterior aspect of the right temporal lobe (image 10, series 21 as well as about the anterior horn of the right lateral ventricle (image 16, series 21) with unchanged ex vacuo dilatation of the right lateral ventricle. There is unchanged prominence of the bifrontal extra-axial spaces. Unchanged scattered periventricular hypodensities compatible microvascular ischemic disease. Given extensive background parenchymal abnormalities, there is no CT evidence of superimposed acute large territory infarct. No intraparenchymal or extra-axial mass or hemorrhage. Unchanged size a configuration of the ventricles and basilar cisterns. No midline shift. Intracranial atherosclerosis.  Limited visualization of the paranasal sinuses and mastoid air cells is normal. No air-fluid levels. There is persistent leftward deviation of the nasal bone, likely the sequela of remote fracture. Post bilateral cataract surgery. No displaced calvarial fracture.  CT CERVICAL SPINE FINDINGS  C1 to the superior endplate of T1 is imaged.  Normal alignment of the cervical spine. No anterolisthesis or retrolisthesis. The bilateral facets are normally aligned. The dens is normally positioned between the lateral masses of C1. Moderate atlantodental degenerative change. Normal atlanto axial articulations.  No fracture or static subluxation of the cervical spine. Cervical vertebral body heights are preserved. Prevertebral soft tissues are normal.  Moderate multilevel cervical spine DDD, worse at C5-C6 and C6-C7 with disc space height loss, endplate irregularity and sclerosis.  There is partial ossification of the nuchal ligament posterior to the C5 and C6 spinous  processes.  No bulky cervical lymphadenopathy on this noncontrast examination. Scattered atherosclerotic plaque within the bilateral carotid bulbs.  Limited visualization lung apices is normal.  IMPRESSION: 1. No acute intracranial process. 2. Sequela of advanced atrophy, microvascular ischemic disease and prior infarcts involving the right temporal lobe and right centrum semi ovale. 3. No fracture or static subluxation of the cervical spine. 4. Moderate multilevel cervical spine DDD, worse at C5-C6 and C6-C7.   Electronically Signed   By: Simonne Come M.D.   On: 02/14/2015 21:18   Ct Cervical Spine Wo Contrast  02/14/2015   CLINICAL DATA:  Post fall hitting back of head. Patient on blood thinners.  EXAM: CT HEAD WITHOUT CONTRAST  CT CERVICAL SPINE WITHOUT CONTRAST  TECHNIQUE: Multidetector CT imaging of the head and cervical spine was performed  following the standard protocol without intravenous contrast. Multiplanar CT image reconstructions of the cervical spine were also generated.  COMPARISON:  12/18/2014  FINDINGS: CT HEAD FINDINGS  Regional soft tissues appear normal with special attention paid to the posterior aspect of the calvarium.  Similar findings of advanced atrophy with sulcal prominence and centralized volume loss with commensurate ex vacuo dilatation of the ventricular system. Stable sequela of prior infarction involving the anterior aspect of the right temporal lobe (image 10, series 21 as well as about the anterior horn of the right lateral ventricle (image 16, series 21) with unchanged ex vacuo dilatation of the right lateral ventricle. There is unchanged prominence of the bifrontal extra-axial spaces. Unchanged scattered periventricular hypodensities compatible microvascular ischemic disease. Given extensive background parenchymal abnormalities, there is no CT evidence of superimposed acute large territory infarct. No intraparenchymal or extra-axial mass or hemorrhage. Unchanged size a  configuration of the ventricles and basilar cisterns. No midline shift. Intracranial atherosclerosis.  Limited visualization of the paranasal sinuses and mastoid air cells is normal. No air-fluid levels. There is persistent leftward deviation of the nasal bone, likely the sequela of remote fracture. Post bilateral cataract surgery. No displaced calvarial fracture.  CT CERVICAL SPINE FINDINGS  C1 to the superior endplate of T1 is imaged.  Normal alignment of the cervical spine. No anterolisthesis or retrolisthesis. The bilateral facets are normally aligned. The dens is normally positioned between the lateral masses of C1. Moderate atlantodental degenerative change. Normal atlanto axial articulations.  No fracture or static subluxation of the cervical spine. Cervical vertebral body heights are preserved. Prevertebral soft tissues are normal.  Moderate multilevel cervical spine DDD, worse at C5-C6 and C6-C7 with disc space height loss, endplate irregularity and sclerosis.  There is partial ossification of the nuchal ligament posterior to the C5 and C6 spinous processes.  No bulky cervical lymphadenopathy on this noncontrast examination. Scattered atherosclerotic plaque within the bilateral carotid bulbs.  Limited visualization lung apices is normal.  IMPRESSION: 1. No acute intracranial process. 2. Sequela of advanced atrophy, microvascular ischemic disease and prior infarcts involving the right temporal lobe and right centrum semi ovale. 3. No fracture or static subluxation of the cervical spine. 4. Moderate multilevel cervical spine DDD, worse at C5-C6 and C6-C7.   Electronically Signed   By: Simonne ComeJohn  Watts M.D.   On: 02/14/2015 21:18   Dg Hand Complete Left  02/14/2015   CLINICAL DATA:  Fourth digit laceration getting out of bed. Initial encounter.  EXAM: LEFT HAND - COMPLETE 3+ VIEW  COMPARISON:  None.  FINDINGS: No acute fracture or malalignment. When accounting for mineralization at the fourth digit, no opaque  foreign body suspected.  Interphalangeal joint narrowing and spurring, expected for age. Ulnar positive variance with abutment of the lunate and distal ulna, developmental versus remote distal radius fracture.  Status post partial amputation of the middle finger.  IMPRESSION: No acute osseous findings.   Electronically Signed   By: Marnee SpringJonathon  Watts M.D.   On: 02/14/2015 21:07     EKG Interpretation   Date/Time:  Sunday Feb 14 2015 21:40:20 EDT Ventricular Rate:  93 PR Interval:    QRS Duration: 87 QT Interval:  369 QTC Calculation: 459 R Axis:   103 Text Interpretation:  Atrial fibrillation Ventricular premature complex  Anteroseptal infarct, age indeterminate No significant change was found  Confirmed by Manus GunningANCOUR  MD, Ivan Lacher [phone] on 02/14/2015 10:34:22 PM      MDM   Final diagnoses:  Fall, initial encounter  Hand laceration, left, initial encounter   Mechanical fall striking head and cutting L hand.  Denies LOC.  On eliquis.  Moving all extremities. No CP or SOB.  Denies dizziness or lightheadedness. EKG with afib and nonspecific ST changes.  Check CT head in setting of eliquis use.  CT head and C spine negative. Laceration to L ring finger.  No tendon involvment. Xrays negative for fracture.  No FB seen on R palm.  Laceration repaired by NP Tysinger. Patient ambulatory and tolerating PO. Denies complaint. Appears stable to return to facility.  Will need suture removal in 7 days.   Glynn Octave, MD 02/15/15 [phone]

## 2015-02-14 NOTE — ED Notes (Signed)
Pt waiting for ptar °

## 2015-02-14 NOTE — ED Notes (Signed)
[facility] the pt [facility]

## 2015-02-14 NOTE — ED Notes (Signed)
Pt drank water with no issues  ?

## 2015-02-14 NOTE — ED Notes (Signed)
Preparing to suture after he returns from xray

## 2015-02-14 NOTE — ED Provider Notes (Signed)
LACERATION REPAIR Performed by: Harle Battiestysinger, Alis Sawchuk Authorized by: Harle Battiestysinger, Alexios Keown Consent: Verbal consent obtained. Risks and benefits: risks, benefits and alternatives were discussed Consent given by: patient Patient identity confirmed: provided demographic data Prepped and Draped in normal sterile fashion Wound explored  Laceration Location: U-shaped laceration to base of the left fourth finger.  Laceration Length:4.5 cm  No Foreign Bodies seen or palpated  Anesthesia: local infiltration  Local anesthetic: marcaine with  epinephrine  Anesthetic total: 8 ml  Irrigation method: syringe Amount of cleaning: standard  Skin closure: 5-0 prolene  Number of sutures: 8  Technique: simple interrupted  Patient tolerance: Patient tolerated the procedure well with no immediate complications.   Harle BattiestElizabeth Reggie Welge, NP 02/15/15 [phone]  Glynn OctaveStephen Rancour, MD 02/15/15 [phone]

## 2015-02-14 NOTE — ED Notes (Signed)
Being sutured

## 2015-02-14 NOTE — ED Notes (Signed)
[facility] and [name] got caught. Patient" Jerry [name]" has injury but wrapped on arrival and bleeding controlled. Patient did fall and hit the back of his head denies any LOC and pain. Patient is on blood thinners.

## 2015-02-14 NOTE — Discharge Instructions (Signed)
Laceration Care, Adult You need to have your sutures removed in 1 week. Return to the ED if you develop new or worsening symptoms. A laceration is a cut or lesion that goes through all layers of the skin and into the tissue just beneath the skin. TREATMENT  Some lacerations may not require closure. Some lacerations may not be able to be closed due to an increased risk of infection. It is important to see your caregiver as soon as possible after an injury to minimize the risk of infection and maximize the opportunity for successful closure. If closure is appropriate, pain medicines may be given, if needed. The wound will be cleaned to help prevent infection. Your caregiver will use stitches (sutures), staples, wound glue (adhesive), or skin adhesive strips to repair the laceration. These tools bring the skin edges together to allow for faster healing and a better cosmetic outcome. However, all wounds will heal with a scar. Once the wound has healed, scarring can be minimized by covering the wound with sunscreen during the day for 1 full year. HOME CARE INSTRUCTIONS  For sutures or staples:  Keep the wound clean and dry.  If you were given a bandage (dressing), you should change it at least once a day. Also, change the dressing if it becomes wet or dirty, or as directed by your caregiver.  Wash the wound with soap and water 2 times a day. Rinse the wound off with water to remove all soap. Pat the wound dry with a clean towel.  After cleaning, apply a thin layer of the antibiotic ointment as recommended by your caregiver. This will help prevent infection and keep the dressing from sticking.  You may shower as usual after the first 24 hours. Do not soak the wound in water until the sutures are removed.  Only take over-the-counter or prescription medicines for pain, discomfort, or fever as directed by your caregiver.  Get your sutures or staples removed as directed by your caregiver. For skin  adhesive strips:  Keep the wound clean and dry.  Do not get the skin adhesive strips wet. You may bathe carefully, using caution to keep the wound dry.  If the wound gets wet, pat it dry with a clean towel.  Skin adhesive strips will fall off on their own. You may trim the strips as the wound heals. Do not remove skin adhesive strips that are still stuck to the wound. They will fall off in time. For wound adhesive:  You may briefly wet your wound in the shower or bath. Do not soak or scrub the wound. Do not swim. Avoid periods of heavy perspiration until the skin adhesive has fallen off on its own. After showering or bathing, gently pat the wound dry with a clean towel.  Do not apply liquid medicine, cream medicine, or ointment medicine to your wound while the skin adhesive is in place. This may loosen the film before your wound is healed.  If a dressing is placed over the wound, be careful not to apply tape directly over the skin adhesive. This may cause the adhesive to be pulled off before the wound is healed.  Avoid prolonged exposure to sunlight or tanning lamps while the skin adhesive is in place. Exposure to ultraviolet light in the first year will darken the scar.  The skin adhesive will usually remain in place for 5 to 10 days, then naturally fall off the skin. Do not pick at the adhesive film. You may need a  tetanus shot if:  You cannot remember when you had your last tetanus shot.  You have never had a tetanus shot. If you get a tetanus shot, your arm may swell, get red, and feel warm to the touch. This is common and not a problem. If you need a tetanus shot and you choose not to have one, there is a rare chance of getting tetanus. Sickness from tetanus can be serious. SEEK MEDICAL CARE IF:   You have redness, swelling, or increasing pain in the wound.  You see a red line that goes away from the wound.  You have yellowish-white fluid (pus) coming from the wound.  You have  a fever.  You notice a bad smell coming from the wound or dressing.  Your wound breaks open before or after sutures have been removed.  You notice something coming out of the wound such as wood or glass.  Your wound is on your hand or foot and you cannot move a finger or toe. SEEK IMMEDIATE MEDICAL CARE IF:   Your pain is not controlled with prescribed medicine.  You have severe swelling around the wound causing pain and numbness or a change in color in your arm, hand, leg, or foot.  Your wound splits open and starts bleeding.  You have worsening numbness, weakness, or loss of function of any joint around or beyond the wound.  You develop painful lumps near the wound or on the skin anywhere on your body. MAKE SURE YOU:   Understand these instructions.  Will watch your condition.  Will get help right away if you are not doing well or get worse. Document Released: 09/11/2005 Document Revised: 12/04/2011 Document Reviewed: 03/07/2011 [facility] Patient Information 2015 [employer] [place]. This information is not intended to replace advice given to you by your health care provider. Make sure you discuss any questions you have with your health care provider.

## 2015-02-24 ENCOUNTER — Other Ambulatory Visit: Payer: Self-pay | Admitting: Nurse Practitioner

## 2015-02-24 DIAGNOSIS — M9988 Other biomechanical lesions of rib cage: Secondary | ICD-10-CM

## 2015-03-04 ENCOUNTER — Ambulatory Visit
Admission: RE | Admit: 2015-03-04 | Discharge: 2015-03-04 | Disposition: A | Discharge status: Home / Self Care | Payer: Medicare Other | Source: Ambulatory Visit | Attending: Nurse Practitioner | Admitting: Nurse Practitioner

## 2015-03-04 DIAGNOSIS — M9988 Other biomechanical lesions of rib cage: Secondary | ICD-10-CM

## 2015-03-11 ENCOUNTER — Encounter (HOSPITAL_COMMUNITY): Payer: Self-pay | Admitting: General Practice

## 2015-03-11 ENCOUNTER — Inpatient Hospital Stay (HOSPITAL_COMMUNITY)
Admission: EM | Admit: 2015-03-11 | Discharge: 2015-03-19 | DRG: 064 | Disposition: A | Discharge status: Skilled Nursing Facility | Payer: Medicare Other | Attending: Family Medicine | Admitting: Family Medicine

## 2015-03-11 ENCOUNTER — Emergency Department (HOSPITAL_COMMUNITY): Payer: Medicare Other

## 2015-03-11 DIAGNOSIS — E785 Hyperlipidemia, unspecified: Secondary | ICD-10-CM | POA: Diagnosis present

## 2015-03-11 DIAGNOSIS — I6340 Cerebral infarction due to embolism of unspecified cerebral artery: Secondary | ICD-10-CM | POA: Insufficient documentation

## 2015-03-11 DIAGNOSIS — E43 Unspecified severe protein-calorie malnutrition: Secondary | ICD-10-CM | POA: Diagnosis present

## 2015-03-11 DIAGNOSIS — Z887 Allergy status to serum and vaccine status: Secondary | ICD-10-CM

## 2015-03-11 DIAGNOSIS — R4182 Altered mental status, unspecified: Secondary | ICD-10-CM | POA: Diagnosis present

## 2015-03-11 DIAGNOSIS — N183 Chronic kidney disease, stage 3 (moderate): Secondary | ICD-10-CM | POA: Diagnosis present

## 2015-03-11 DIAGNOSIS — N3000 Acute cystitis without hematuria: Secondary | ICD-10-CM | POA: Diagnosis not present

## 2015-03-11 DIAGNOSIS — I482 Chronic atrial fibrillation, unspecified: Secondary | ICD-10-CM | POA: Diagnosis present

## 2015-03-11 DIAGNOSIS — N12 Tubulo-interstitial nephritis, not specified as acute or chronic: Secondary | ICD-10-CM | POA: Diagnosis present

## 2015-03-11 DIAGNOSIS — I639 Cerebral infarction, unspecified: Secondary | ICD-10-CM | POA: Diagnosis not present

## 2015-03-11 DIAGNOSIS — S2242XD Multiple fractures of ribs, left side, subsequent encounter for fracture with routine healing: Secondary | ICD-10-CM

## 2015-03-11 DIAGNOSIS — Z880 Allergy status to penicillin: Secondary | ICD-10-CM

## 2015-03-11 DIAGNOSIS — B952 Enterococcus as the cause of diseases classified elsewhere: Secondary | ICD-10-CM | POA: Diagnosis present

## 2015-03-11 DIAGNOSIS — Z79899 Other long term (current) drug therapy: Secondary | ICD-10-CM

## 2015-03-11 DIAGNOSIS — G92 Toxic encephalopathy: Secondary | ICD-10-CM | POA: Diagnosis present

## 2015-03-11 DIAGNOSIS — Z7901 Long term (current) use of anticoagulants: Secondary | ICD-10-CM

## 2015-03-11 DIAGNOSIS — R9401 Abnormal electroencephalogram [EEG]: Secondary | ICD-10-CM | POA: Diagnosis present

## 2015-03-11 DIAGNOSIS — Z6821 Body mass index (BMI) 21.0-21.9, adult: Secondary | ICD-10-CM

## 2015-03-11 DIAGNOSIS — Z87891 Personal history of nicotine dependence: Secondary | ICD-10-CM | POA: Diagnosis not present

## 2015-03-11 DIAGNOSIS — I129 Hypertensive chronic kidney disease with stage 1 through stage 4 chronic kidney disease, or unspecified chronic kidney disease: Secondary | ICD-10-CM | POA: Diagnosis present

## 2015-03-11 DIAGNOSIS — G9340 Encephalopathy, unspecified: Secondary | ICD-10-CM

## 2015-03-11 DIAGNOSIS — J45909 Unspecified asthma, uncomplicated: Secondary | ICD-10-CM | POA: Diagnosis present

## 2015-03-11 DIAGNOSIS — I10 Essential (primary) hypertension: Secondary | ICD-10-CM | POA: Diagnosis present

## 2015-03-11 DIAGNOSIS — G253 Myoclonus: Secondary | ICD-10-CM | POA: Diagnosis present

## 2015-03-11 DIAGNOSIS — N390 Urinary tract infection, site not specified: Secondary | ICD-10-CM | POA: Diagnosis present

## 2015-03-11 DIAGNOSIS — R319 Hematuria, unspecified: Secondary | ICD-10-CM

## 2015-03-11 DIAGNOSIS — Z66 Do not resuscitate: Secondary | ICD-10-CM | POA: Diagnosis present

## 2015-03-11 DIAGNOSIS — Z885 Allergy status to narcotic agent status: Secondary | ICD-10-CM

## 2015-03-11 DIAGNOSIS — M1990 Unspecified osteoarthritis, unspecified site: Secondary | ICD-10-CM | POA: Diagnosis present

## 2015-03-11 DIAGNOSIS — F0390 Unspecified dementia without behavioral disturbance: Secondary | ICD-10-CM | POA: Diagnosis present

## 2015-03-11 DIAGNOSIS — R296 Repeated falls: Secondary | ICD-10-CM | POA: Diagnosis present

## 2015-03-11 HISTORY — DX: Chronic atrial fibrillation, unspecified: I48.20

## 2015-03-11 LAB — CBC WITH DIFFERENTIAL/PLATELET
Basophils Absolute: 0.1 10*3/uL (ref 0.0–0.1)
Basophils Relative: 1 % (ref 0–1)
EOS ABS: 0.2 10*3/uL (ref 0.0–0.7)
Eosinophils Relative: 3 % (ref 0–5)
HCT: 36.3 % — ABNORMAL LOW (ref 39.0–52.0)
Hemoglobin: 12.2 g/dL — ABNORMAL LOW (ref 13.0–17.0)
LYMPHS ABS: 1.7 10*3/uL (ref 0.7–4.0)
Lymphocytes Relative: 19 % (ref 12–46)
MCH: 27.3 pg (ref 26.0–34.0)
MCHC: 33.6 g/dL (ref 30.0–36.0)
MCV: 81.2 fL (ref 78.0–100.0)
MONO ABS: 1.7 10*3/uL — AB (ref 0.1–1.0)
MONOS PCT: 19 % — AB (ref 3–12)
Neutro Abs: 5.3 10*3/uL (ref 1.7–7.7)
Neutrophils Relative %: 59 % (ref 43–77)
Platelets: 175 10*3/uL (ref 150–400)
RBC: 4.47 MIL/uL (ref 4.22–5.81)
RDW: 14.5 % (ref 11.5–15.5)
WBC: 8.9 10*3/uL (ref 4.0–10.5)

## 2015-03-11 LAB — URINE MICROSCOPIC-ADD ON

## 2015-03-11 LAB — COMPREHENSIVE METABOLIC PANEL
ALBUMIN: 3.1 g/dL — AB (ref 3.5–5.0)
ALT: 15 U/L — ABNORMAL LOW (ref 17–63)
AST: 22 U/L (ref 15–41)
Alkaline Phosphatase: 91 U/L (ref 38–126)
Anion gap: 9 (ref 5–15)
BUN: 15 mg/dL (ref 6–20)
CALCIUM: 8.9 mg/dL (ref 8.9–10.3)
CHLORIDE: 105 mmol/L (ref 101–111)
CO2: 22 mmol/L (ref 22–32)
CREATININE: 1.46 mg/dL — AB (ref 0.61–1.24)
GFR calc Af Amer: 48 mL/min — ABNORMAL LOW (ref >60)
GFR, EST NON AFRICAN AMERICAN: 41 mL/min — AB (ref >60)
Glucose, Bld: 106 mg/dL — ABNORMAL HIGH (ref 65–99)
Potassium: 4.7 mmol/L (ref 3.5–5.1)
Sodium: 136 mmol/L (ref 135–145)
Total Bilirubin: 0.8 mg/dL (ref 0.3–1.2)
Total Protein: 6.3 g/dL — ABNORMAL LOW (ref 6.5–8.1)

## 2015-03-11 LAB — URINALYSIS, ROUTINE W REFLEX MICROSCOPIC
BILIRUBIN URINE: NEGATIVE
GLUCOSE, UA: NEGATIVE mg/dL
Ketones, ur: NEGATIVE mg/dL
NITRITE: NEGATIVE
PH: 5.5 (ref 5.0–8.0)
PROTEIN: NEGATIVE mg/dL
Specific Gravity, Urine: 1.016 (ref 1.005–1.030)
UROBILINOGEN UA: 0.2 mg/dL (ref 0.0–1.0)

## 2015-03-11 LAB — GLUCOSE, CAPILLARY: Glucose-Capillary: 119 mg/dL — ABNORMAL HIGH (ref 65–99)

## 2015-03-11 LAB — I-STAT TROPONIN, ED: TROPONIN I, POC: 0.01 ng/mL (ref 0.00–0.08)

## 2015-03-11 MED ORDER — AMLODIPINE BESYLATE 10 MG PO TABS
10.0000 mg | ORAL_TABLET | Freq: Every day | ORAL | Status: DC
  Administered 2015-03-12 – 2015-03-19 (×7): 10 mg via ORAL
  Filled 2015-03-11 (×7): qty 1

## 2015-03-11 MED ORDER — DEXTROSE 5 % IV SOLN
1.0000 g | Freq: Once | INTRAVENOUS | Status: AC
  Administered 2015-03-11: 1 g via INTRAVENOUS
  Filled 2015-03-11: qty 10

## 2015-03-11 MED ORDER — ATORVASTATIN CALCIUM 40 MG PO TABS
40.0000 mg | ORAL_TABLET | Freq: Every day | ORAL | Status: DC
  Administered 2015-03-12 – 2015-03-17 (×5): 40 mg via ORAL
  Filled 2015-03-11 (×5): qty 1

## 2015-03-11 MED ORDER — TRAMADOL HCL 50 MG PO TABS
50.0000 mg | ORAL_TABLET | Freq: Four times a day (QID) | ORAL | Status: DC | PRN
  Administered 2015-03-12 – 2015-03-14 (×5): 50 mg via ORAL
  Filled 2015-03-11 (×5): qty 1

## 2015-03-11 MED ORDER — APIXABAN 5 MG PO TABS: 5.0000 mg | ORAL_TABLET | Freq: Two times a day (BID) | ORAL | Status: DC

## 2015-03-11 MED ORDER — HEPARIN (PORCINE) IN NACL 100-0.45 UNIT/ML-% IJ SOLN
900.0000 [IU]/h | INTRAMUSCULAR | Status: DC
Start: 2015-03-11 — End: 2015-03-13
  Administered 2015-03-12: 900 [IU]/h via INTRAVENOUS
  Filled 2015-03-11 (×3): qty 250

## 2015-03-11 MED ORDER — METOPROLOL TARTRATE 25 MG PO TABS
25.0000 mg | ORAL_TABLET | Freq: Every day | ORAL | Status: DC
  Administered 2015-03-12 – 2015-03-16 (×5): 25 mg via ORAL
  Filled 2015-03-11 (×5): qty 1

## 2015-03-11 MED ORDER — SODIUM CHLORIDE 0.9 % IV SOLN
INTRAVENOUS | Status: DC
  Administered 2015-03-11 – 2015-03-17 (×4): via INTRAVENOUS

## 2015-03-11 MED ORDER — CEFTRIAXONE SODIUM IN DEXTROSE 20 MG/ML IV SOLN
1.0000 g | INTRAVENOUS | Status: DC
  Administered 2015-03-12 – 2015-03-14 (×3): 1 g via INTRAVENOUS
  Filled 2015-03-11 (×4): qty 50

## 2015-03-11 NOTE — ED Provider Notes (Addendum)
CSN: [id number]     Arrival date & time 03/11/15  1755 History   First MD Initiated Contact with Patient 03/11/15 1808     Chief Complaint  Patient presents with  . Altered Mental Status     (Consider location/radiation/quality/duration/timing/severity/associated sxs/prior Treatment) HPI Level V caveat confusion. History is obtained from records accompanying patient and from Malka So, supervisor at assisted-living facility. Patient has had increased confusion and increased falls over the past several weeks. He hasn't walked in 3 days. He normally walks with assistance, walking while holding onto a wheelchair. Patient denies pain anywhere. Denies shortness of breath. He has had no known fever. No treatment prior to coming here. No other associated symptoms. Past Medical History  Diagnosis Date  . Hypertension   . Arthritis   . Hernia   . Dysrhythmia     atrial fibrillation  . Head injury   . Humerus fracture     right  . Asthma     as a child  . Pneumonia   . Stroke     "light stroke"  . H/O hiatal hernia   . Stroke 2016  . A-fib   . Back pain    Past Surgical History  Procedure Laterality Date  . Finger amputation Left     little  . Orif humerus fracture Right 09/15/2013    Procedure: OPEN REDUCTION INTERNAL FIXATION (ORIF) RIGHT HUMERAL SHAFT FRACTURE;  Surgeon: Cheral Almas, MD;  Location: [facility] OR;  Service: Orthopedics;  Laterality: Right;   Family History  Problem Relation Age of Onset  . Heart attack Brother    History  Substance Use Topics  . Smoking status: Never Smoker   . Smokeless tobacco: Former Neurosurgeon    Types: Chew  . Alcohol Use: Yes     Comment: heavy drinker    Review of Systems  Unable to perform ROS: Mental status change  Musculoskeletal: Positive for gait problem.      Allergies  Hydrocodone; Penicillins; and Tetanus toxoids  Home Medications   Prior to Admission medications   Medication Sig Start Date End Date Taking?  Authorizing Provider  amLODipine (NORVASC) 10 MG tablet Take 10 mg by mouth daily.    Historical Provider, MD  apixaban (ELIQUIS) 5 MG TABS tablet Take 1 tablet (5 mg total) by mouth 2 (two) times daily. 12/14/14   Richarda Overlie, MD  atorvastatin (LIPITOR) 40 MG tablet Take 1 tablet (40 mg total) by mouth daily at 6 PM. 12/14/14   Richarda Overlie, MD  metoprolol (LOPRESSOR) 50 MG tablet Take 0.5 tablets (25 mg total) by mouth daily. 12/14/14   Richarda Overlie, MD  traMADol (ULTRAM) 50 MG tablet Take 1 tablet (50 mg total) by mouth every 6 (six) hours as needed. 12/14/14   Richarda Overlie, MD  vitamin B-12 1000 MCG tablet Take 1 tablet (1,000 mcg total) by mouth daily. 12/14/14   Richarda Overlie, MD   BP 139/81 mmHg  Pulse 93  Temp(Src) 98.6 F (37 C) (Rectal)  Resp 18  Ht  (1.753 m)  Wt 149 lb (67.586 kg)  BMI 21.99 kg/m2  SpO2 98% Physical Exam  Constitutional: He is oriented to person, place, and time.  Chronically ill-appearing  HENT:  Head: Normocephalic and atraumatic.  Eyes: Conjunctivae are normal. Pupils are equal, round, and reactive to light.  Neck: Neck supple. No tracheal deviation present. No thyromegaly present.  Cardiovascular: Normal rate.   No murmur heard. Irregularly irregular  Pulmonary/Chest: Effort normal and breath  sounds normal.  Abdominal: Soft. Bowel sounds are normal. He exhibits no distension. There is no tenderness.  Musculoskeletal: Normal range of motion. He exhibits no edema or tenderness.  Entire spine nontender. Pelvis stable nontender. Left upper extremity with middle finger amputated, old. Dime-sized abrasion over dorsum of right hand. All other extremities without contusion abrasion or tenderness neurovascular intact  Neurological: He is alert and oriented to person, place, and time. Coordination normal.  Oriented to name only  Skin: Skin is warm and dry. No rash noted.  Nursing note and vitals reviewed.   ED Course  Procedures (including critical care  time) Labs Review Labs Reviewed  URINALYSIS, ROUTINE W REFLEX MICROSCOPIC (NOT AT [facility])  COMPREHENSIVE METABOLIC PANEL  CBC WITH DIFFERENTIAL/PLATELET  I-STAT TROPOININ, ED    Imaging Review No results found.   EKG Interpretation   Date/Time:  Thursday March 11 2015 18:42:36 EDT Ventricular Rate:  98 PR Interval:    QRS Duration: 77 QT Interval:  436 QTC Calculation: 557 R Axis:   109 Text Interpretation:  Atrial fibrillation Ventricular premature complex  Anteroseptal infarct, age indeterminate Prolonged QT interval No  significant change since last tracing Confirmed by Ethelda Chick  MD, Jamee Pacholski  [id number]) on 03/11/2015 6:56:46 PM     Ms. Marina Goodell reports to me the patient is disoriented to date and place at baseline 7 PM I interviewed patient's daughter who arrived who states that he's had drastic change in mental status and level of confusion over the past 4 days. He did walk 2 days ago, and she was able to assist him to the bathroom Chest x-ray viewed by me Results for orders placed or performed during the hospital encounter of 03/11/15  Urinalysis, Routine w reflex microscopic (not at [facility])  Result Value Ref Range   Color, Urine YELLOW YELLOW   APPearance CLOUDY (A) CLEAR   Specific Gravity, Urine 1.016 1.005 - 1.030   pH 5.5 5.0 - 8.0   Glucose, UA NEGATIVE NEGATIVE mg/dL   Hgb urine dipstick LARGE (A) NEGATIVE   Bilirubin Urine NEGATIVE NEGATIVE   Ketones, ur NEGATIVE NEGATIVE mg/dL   Protein, ur NEGATIVE NEGATIVE mg/dL   Urobilinogen, UA 0.2 0.0 - 1.0 mg/dL   Nitrite NEGATIVE NEGATIVE   Leukocytes, UA MODERATE (A) NEGATIVE  Comprehensive metabolic panel  Result Value Ref Range   Sodium 136 135 - 145 mmol/L   Potassium 4.7 3.5 - 5.1 mmol/L   Chloride 105 101 - 111 mmol/L   CO2 22 22 - 32 mmol/L   Glucose, Bld 106 (H) 65 - 99 mg/dL   BUN 15 6 - 20 mg/dL   Creatinine, Ser 6.04 (H) 0.61 - 1.24 mg/dL   Calcium 8.9 8.9 - 54.0 mg/dL   Total Protein 6.3 (L) 6.5 - 8.1 g/dL    Albumin 3.1 (L) 3.5 - 5.0 g/dL   AST 22 15 - 41 U/L   ALT 15 (L) 17 - 63 U/L   Alkaline Phosphatase 91 38 - 126 U/L   Total Bilirubin 0.8 0.3 - 1.2 mg/dL   GFR calc non Af Amer 41 (L) >60 mL/min   GFR calc Af Amer 48 (L) >60 mL/min   Anion gap 9 5 - 15  CBC with Differential/Platelet  Result Value Ref Range   WBC 8.9 4.0 - 10.5 K/uL   RBC 4.47 4.22 - 5.81 MIL/uL   Hemoglobin 12.2 (L) 13.0 - 17.0 g/dL   HCT 98.1 (L) 19.1 - 47.8 %   MCV 81.2 78.0 -  100.0 fL   MCH 27.3 26.0 - 34.0 pg   MCHC 33.6 30.0 - 36.0 g/dL   RDW 16.1 09.6 - 04.5 %   Platelets 175 150 - 400 K/uL   Neutrophils Relative % 59 43 - 77 %   Neutro Abs 5.3 1.7 - 7.7 K/uL   Lymphocytes Relative 19 12 - 46 %   Lymphs Abs 1.7 0.7 - 4.0 K/uL   Monocytes Relative 19 (H) 3 - 12 %   Monocytes Absolute 1.7 (H) 0.1 - 1.0 K/uL   Eosinophils Relative 3 0 - 5 %   Eosinophils Absolute 0.2 0.0 - 0.7 K/uL   Basophils Relative 1 0 - 1 %   Basophils Absolute 0.1 0.0 - 0.1 K/uL  Urine microscopic-add on  Result Value Ref Range   Squamous Epithelial / LPF RARE RARE   WBC, UA 21-50 <3 WBC/hpf   RBC / HPF TOO NUMEROUS TO COUNT <3 RBC/hpf   Bacteria, UA FEW (A) RARE   Casts GRANULAR CAST (A) NEGATIVE  I-stat troponin, ED  Result Value Ref Range   Troponin i, poc 0.01 0.00 - 0.08 ng/mL   Comment 3           Dg Chest 1 View  03/11/2015   CLINICAL DATA:  Altered mental status and chest pain  EXAM: CHEST  1 VIEW  COMPARISON:  03/04/2015  FINDINGS: Cardiac shadow is mildly enlarged. The known bilateral rib fractures are not well appreciated on this exam. The lungs are well-aerated without focal infiltrate or sizable effusion. Mild interstitial changes are again seen. Postsurgical changes in the right humerus are noted.  IMPRESSION: No acute abnormality noted.   Electronically Signed   By: Alcide Clever M.D.   On: 03/11/2015 20:01   Dg Chest 2 View  02/14/2015   CLINICAL DATA:  Patient with injury to the finger. No chest complaints.   EXAM: CHEST  2 VIEW  COMPARISON:  Chest radiograph 12/11/2014  FINDINGS: Stable enlarged cardiac and mediastinal contours. No consolidative pulmonary opacities. Calcified granuloma left upper lung. Mid thoracic spine degenerative changes. Plate and screw hardware proximal right humerus.  IMPRESSION: No acute cardiopulmonary process.   Electronically Signed   By: Annia Belt M.D.   On: 02/14/2015 21:05   Dg Wrist Complete Right  02/14/2015   CLINICAL DATA:  Right wrist pain.  EXAM: RIGHT WRIST - COMPLETE 3+ VIEW  COMPARISON:  None.  FINDINGS: No fracture or dislocation. There is degenerative change involving the scapholunate articulation. No definite erosions. No evidence of chondrocalcinosis. Adjacent vascular calcifications. No definite displacement of the pronator quadratus fat pad. Regional soft tissues appear normal. There is a punctate (approximately 3 mm) ossicle within the soft tissues of the imaged mid aspect of the palm. No associated subcutaneous emphysema. No radiopaque foreign body.  IMPRESSION: 1. No acute findings. 2. Degenerative change involving the scapholunate articulation, presumably degenerative in etiology though could be seen in the setting of an inflammatory arthropathy. Clinical correlation is advised. 3. Punctate (approximately 3 mm) ossicle within the subcutaneous tissues of the palm without associated subcutaneous emphysema. Clinical correlation is advised.   Electronically Signed   By: Simonne Come M.D.   On: 02/14/2015 21:09   Ct Head Wo Contrast  03/11/2015   CLINICAL DATA:  Multiple falls in last few weeks, initial encounter  EXAM: CT HEAD WITHOUT CONTRAST  CT CERVICAL SPINE WITHOUT CONTRAST  TECHNIQUE: Multidetector CT imaging of the head and cervical spine was performed following the standard protocol  without intravenous contrast. Multiplanar CT image reconstructions of the cervical spine were also generated.  COMPARISON:  02/14/2015  FINDINGS: CT HEAD FINDINGS  The bony  calvarium is intact. Diffuse atrophic and chronic white matter ischemic changes are seen. There are changes of prior infarcts identified within the right temporal lobe and extending superiorly into the right basal ganglia and centrum semi ovale. No findings to suggest acute hemorrhage, acute infarction or space-occupying mass lesion are noted.  CT CERVICAL SPINE FINDINGS  The examination is somewhat limited by patient motion artifact. Multilevel facet hypertrophic changes are seen. Vertebral body height is well maintained. Osteophytic changes are noted worst at the C5-6 and C6-7 level. Changes consistent with healing fracture of the left second rib posteriorly are seen. The visualized lung apices are within normal limits. The surrounding soft tissues show carotid calcifications without acute abnormality. Seen only on the sagittal reconstructions there is an undisplaced fracture of the left clavicular head.  IMPRESSION: CT of the head: Chronic atrophic and ischemic changes as well as findings of prior infarcts. These changes are stable from the prior study.  CT of the cervical spine:  Healing left second rib fracture.  Left clavicular head fracture without significant displacement. No other focal acute bony abnormality is noted.  Multilevel degenerative change .  Multilevel degenerative change without acute abnormality.   Electronically Signed   By: Alcide Clever M.D.   On: 03/11/2015 20:17   Ct Head Wo Contrast  02/14/2015   CLINICAL DATA:  Post fall hitting back of head. Patient on blood thinners.  EXAM: CT HEAD WITHOUT CONTRAST  CT CERVICAL SPINE WITHOUT CONTRAST  TECHNIQUE: Multidetector CT imaging of the head and cervical spine was performed following the standard protocol without intravenous contrast. Multiplanar CT image reconstructions of the cervical spine were also generated.  COMPARISON:  12/18/2014  FINDINGS: CT HEAD FINDINGS  Regional soft tissues appear normal with special attention paid to the  posterior aspect of the calvarium.  Similar findings of advanced atrophy with sulcal prominence and centralized volume loss with commensurate ex vacuo dilatation of the ventricular system. Stable sequela of prior infarction involving the anterior aspect of the right temporal lobe (image 10, series 21 as well as about the anterior horn of the right lateral ventricle (image 16, series 21) with unchanged ex vacuo dilatation of the right lateral ventricle. There is unchanged prominence of the bifrontal extra-axial spaces. Unchanged scattered periventricular hypodensities compatible microvascular ischemic disease. Given extensive background parenchymal abnormalities, there is no CT evidence of superimposed acute large territory infarct. No intraparenchymal or extra-axial mass or hemorrhage. Unchanged size a configuration of the ventricles and basilar cisterns. No midline shift. Intracranial atherosclerosis.  Limited visualization of the paranasal sinuses and mastoid air cells is normal. No air-fluid levels. There is persistent leftward deviation of the nasal bone, likely the sequela of remote fracture. Post bilateral cataract surgery. No displaced calvarial fracture.  CT CERVICAL SPINE FINDINGS  C1 to the superior endplate of T1 is imaged.  Normal alignment of the cervical spine. No anterolisthesis or retrolisthesis. The bilateral facets are normally aligned. The dens is normally positioned between the lateral masses of C1. Moderate atlantodental degenerative change. Normal atlanto axial articulations.  No fracture or static subluxation of the cervical spine. Cervical vertebral body heights are preserved. Prevertebral soft tissues are normal.  Moderate multilevel cervical spine DDD, worse at C5-C6 and C6-C7 with disc space height loss, endplate irregularity and sclerosis.  There is partial ossification of the nuchal ligament posterior  to the C5 and C6 spinous processes.  No bulky cervical lymphadenopathy on this  noncontrast examination. Scattered atherosclerotic plaque within the bilateral carotid bulbs.  Limited visualization lung apices is normal.  IMPRESSION: 1. No acute intracranial process. 2. Sequela of advanced atrophy, microvascular ischemic disease and prior infarcts involving the right temporal lobe and right centrum semi ovale. 3. No fracture or static subluxation of the cervical spine. 4. Moderate multilevel cervical spine DDD, worse at C5-C6 and C6-C7.   Electronically Signed   By: Simonne Come M.D.   On: 02/14/2015 21:18   Ct Chest Wo Contrast  03/04/2015   CLINICAL DATA:  History of multiple falls, evaluate for rib lesion, former smoking history  EXAM: CT CHEST WITHOUT CONTRAST  TECHNIQUE: Multidetector CT imaging of the chest was performed following the standard protocol without IV contrast.  COMPARISON:  Chest x-ray of 02/14/2015  FINDINGS: Calcified granulomas are present within the left upper lobe consistent with prior granulomatous disease. Also there is some calcification within the spleen consistent with a benign process. No active infiltrate is seen. No pleural effusion is noted. The central airway is patent.  On bone window images, there are nondisplaced fractures of the anterior right second, third and fifth ribs with some callus formation present. On the left, there are fractures the anterior left third, fourth, fifth, and sixth ribs with some callus formation. There is a thoracic kyphosis present in the bones appear diffusely osteopenic. Very minimal compression of the superior endplate of T8 is present. The sternum appears intact.  On soft tissue window images, the thyroid gland is unremarkable. Atheromatous change is noted involving the aortic arch and descending thoracic aorta. No mediastinal or hilar adenopathy is seen. Coronary artery calcifications are present primarily within the left anterior descending artery and cardiomegaly is noted. There does appear to be a small hiatal hernia  present.  IMPRESSION: 1. Subacute fractures of the anterior right second, third, and fifth ribs with subacute fractures of the anterior left third, fourth, fifth, and sixth ribs with some callus formation present. Diffuse osteopenia. 2. Changes of prior granulomatous these disease with granulomas in the left upper lobe. 3. Thoracic kyphosis. 4. Calcification of the left anterior descending coronary artery. 5. Small hiatal hernia.   Electronically Signed   By: Dwyane Dee M.D.   On: 03/04/2015 11:44   Ct Cervical Spine Wo Contrast  03/11/2015   CLINICAL DATA:  Multiple falls in last few weeks, initial encounter  EXAM: CT HEAD WITHOUT CONTRAST  CT CERVICAL SPINE WITHOUT CONTRAST  TECHNIQUE: Multidetector CT imaging of the head and cervical spine was performed following the standard protocol without intravenous contrast. Multiplanar CT image reconstructions of the cervical spine were also generated.  COMPARISON:  02/14/2015  FINDINGS: CT HEAD FINDINGS  The bony calvarium is intact. Diffuse atrophic and chronic white matter ischemic changes are seen. There are changes of prior infarcts identified within the right temporal lobe and extending superiorly into the right basal ganglia and centrum semi ovale. No findings to suggest acute hemorrhage, acute infarction or space-occupying mass lesion are noted.  CT CERVICAL SPINE FINDINGS  The examination is somewhat limited by patient motion artifact. Multilevel facet hypertrophic changes are seen. Vertebral body height is well maintained. Osteophytic changes are noted worst at the C5-6 and C6-7 level. Changes consistent with healing fracture of the left second rib posteriorly are seen. The visualized lung apices are within normal limits. The surrounding soft tissues show carotid calcifications without acute abnormality. Seen only  on the sagittal reconstructions there is an undisplaced fracture of the left clavicular head.  IMPRESSION: CT of the head: Chronic atrophic and  ischemic changes as well as findings of prior infarcts. These changes are stable from the prior study.  CT of the cervical spine:  Healing left second rib fracture.  Left clavicular head fracture without significant displacement. No other focal acute bony abnormality is noted.  Multilevel degenerative change .  Multilevel degenerative change without acute abnormality.   Electronically Signed   By: Alcide Clever M.D.   On: 03/11/2015 20:17   Ct Cervical Spine Wo Contrast  02/14/2015   CLINICAL DATA:  Post fall hitting back of head. Patient on blood thinners.  EXAM: CT HEAD WITHOUT CONTRAST  CT CERVICAL SPINE WITHOUT CONTRAST  TECHNIQUE: Multidetector CT imaging of the head and cervical spine was performed following the standard protocol without intravenous contrast. Multiplanar CT image reconstructions of the cervical spine were also generated.  COMPARISON:  12/18/2014  FINDINGS: CT HEAD FINDINGS  Regional soft tissues appear normal with special attention paid to the posterior aspect of the calvarium.  Similar findings of advanced atrophy with sulcal prominence and centralized volume loss with commensurate ex vacuo dilatation of the ventricular system. Stable sequela of prior infarction involving the anterior aspect of the right temporal lobe (image 10, series 21 as well as about the anterior horn of the right lateral ventricle (image 16, series 21) with unchanged ex vacuo dilatation of the right lateral ventricle. There is unchanged prominence of the bifrontal extra-axial spaces. Unchanged scattered periventricular hypodensities compatible microvascular ischemic disease. Given extensive background parenchymal abnormalities, there is no CT evidence of superimposed acute large territory infarct. No intraparenchymal or extra-axial mass or hemorrhage. Unchanged size a configuration of the ventricles and basilar cisterns. No midline shift. Intracranial atherosclerosis.  Limited visualization of the paranasal sinuses  and mastoid air cells is normal. No air-fluid levels. There is persistent leftward deviation of the nasal bone, likely the sequela of remote fracture. Post bilateral cataract surgery. No displaced calvarial fracture.  CT CERVICAL SPINE FINDINGS  C1 to the superior endplate of T1 is imaged.  Normal alignment of the cervical spine. No anterolisthesis or retrolisthesis. The bilateral facets are normally aligned. The dens is normally positioned between the lateral masses of C1. Moderate atlantodental degenerative change. Normal atlanto axial articulations.  No fracture or static subluxation of the cervical spine. Cervical vertebral body heights are preserved. Prevertebral soft tissues are normal.  Moderate multilevel cervical spine DDD, worse at C5-C6 and C6-C7 with disc space height loss, endplate irregularity and sclerosis.  There is partial ossification of the nuchal ligament posterior to the C5 and C6 spinous processes.  No bulky cervical lymphadenopathy on this noncontrast examination. Scattered atherosclerotic plaque within the bilateral carotid bulbs.  Limited visualization lung apices is normal.  IMPRESSION: 1. No acute intracranial process. 2. Sequela of advanced atrophy, microvascular ischemic disease and prior infarcts involving the right temporal lobe and right centrum semi ovale. 3. No fracture or static subluxation of the cervical spine. 4. Moderate multilevel cervical spine DDD, worse at C5-C6 and C6-C7.   Electronically Signed   By: Simonne Come M.D.   On: 02/14/2015 21:18   Dg Hand Complete Left  02/14/2015   CLINICAL DATA:  Fourth digit laceration getting out of bed. Initial encounter.  EXAM: LEFT HAND - COMPLETE 3+ VIEW  COMPARISON:  None.  FINDINGS: No acute fracture or malalignment. When accounting for mineralization at the fourth digit, no opaque  foreign body suspected.  Interphalangeal joint narrowing and spurring, expected for age. Ulnar positive variance with abutment of the lunate and distal  ulna, developmental versus remote distal radius fracture.  Status post partial amputation of the middle finger.  IMPRESSION: No acute osseous findings.   Electronically Signed   By: Marnee Spring M.D.   On: 02/14/2015 21:07    MDM  I spoke with patient's daughter. Patient is a DO NOT RESUSCITATE CODE STATUS. Spoke with Dr. Toniann Fail plan the 3 hour observation medical surgical floor, intervenous antibiotics Urine sent for culture Diagnoses #1 altered mental status #2 urinary tract infection #3 renal insufficiency #71microscopic hematuria Final diagnoses:  Altered mental status  Altered mental status        Doug Sou, MD 03/11/15 1610  Doug Sou, MD 03/11/15 2059

## 2015-03-11 NOTE — Progress Notes (Signed)
Pt came from ED. Pt alert confused. Family and admitting MD at bedside.

## 2015-03-11 NOTE — Progress Notes (Signed)
ANTICOAGULATION CONSULT NOTE - Initial Consult  Pharmacy Consult for heparin Indication: atrial fibrillation  Allergies  Allergen Reactions  . Hydrocodone Other (See Comments)    Reaction unknown  . Penicillins Other (See Comments)    REACTION: unknown  . Tetanus Toxoids Other (See Comments)    unknown    Patient Measurements: Height: 5\' 9"  (175.3 cm) Weight: 143 lb 8.3 oz (65.1 kg) IBW/kg (Calculated) : 70.7  Vital Signs: Temp: 97.7 F (36.5 C) (06/16 2155) Temp Source: Oral (06/16 2155) BP: 114/69 mmHg (06/16 2155) Pulse Rate: 107 (06/16 2155)  Labs:  Recent Labs  03/11/15 1905  HGB 12.2*  HCT 36.3*  PLT 175  CREATININE 1.46*    Estimated Creatinine Clearance: 32.2 mL/min (by C-G formula based on Cr of 1.46).   Medical History: Past Medical History  Diagnosis Date  . Hypertension   . Arthritis   . Hernia   . Dysrhythmia     atrial fibrillation  . Head injury   . Humerus fracture     right  . Asthma     as a child  . Pneumonia   . Stroke     "light stroke"  . H/O hiatal hernia   . Stroke 2016  . A-fib   . Back pain     Medications:  Prescriptions prior to admission  Medication Sig Dispense Refill Last Dose  . amLODipine (NORVASC) 10 MG tablet Take 10 mg by mouth daily.   03/11/2015 at Unknown time  . apixaban (ELIQUIS) 5 MG TABS tablet Take 1 tablet (5 mg total) by mouth 2 (two) times daily. 60 tablet 2 03/11/2015 at Unknown time  . atorvastatin (LIPITOR) 40 MG tablet Take 1 tablet (40 mg total) by mouth daily at 6 PM. 30 tablet 2 03/10/2015 at Unknown time  . metoprolol (LOPRESSOR) 50 MG tablet Take 0.5 tablets (25 mg total) by mouth daily. 30 tablet 0 03/11/2015 at 0800  . sulfamethoxazole-trimethoprim (BACTRIM DS,SEPTRA DS) 800-160 MG per tablet Take 1 tablet by mouth 2 (two) times daily. Started medication on 03-03-15   03/11/2015 at Unknown time  . traMADol (ULTRAM) 50 MG tablet Take 1 tablet (50 mg total) by mouth every 6 (six) hours as needed.  (Patient taking differently: Take 50 mg by mouth 3 (three) times daily. ) 60 tablet 0 03/11/2015 at Unknown time  . vitamin B-12 1000 MCG tablet Take 1 tablet (1,000 mcg total) by mouth daily. 30 tablet 2 03/11/2015 at Unknown time   Scheduled:  . [START ON 03/12/2015] amLODipine  10 mg Oral Daily  . [START ON 03/12/2015] atorvastatin  40 mg Oral q1800  . [START ON 03/12/2015] cefTRIAXone (ROCEPHIN)  IV  1 g Intravenous Q24H  . [START ON 03/12/2015] metoprolol  25 mg Oral Daily   Infusions:  . sodium chloride 50 mL/hr at 03/11/15 2318    Assessment: 81yo male presents from assisted living w/ worsening confusion and falls, positive for UTI, to continue anticoag for Afib though has failed swallow screen, to transition from Eliquis to heparin; per P[facility] records last dose of Eliquis was 6/16 at 0800.  Goal of Therapy:  Heparin level 0.3-0.7 units/ml aPTT 66-102 seconds Monitor platelets by anticoagulation protocol: Yes   Plan:  Will begin heparin gtt at 900 units/hr and monitor heparin levels, aPTT, and CBC.  Vernard Gambles, PharmD, BCPS  03/11/2015,11:24 PM

## 2015-03-11 NOTE — H&P (Signed)
[facility] History and Physical  Jerry [name] ZOX:[id number] DOB: [birth year] DOA: 03/11/2015  Referring physician: Dr.[email or web address]. PCP: [name], MD  Specialists: None.  Chief Complaint: Confusion.  History obtained from patient's daughter and previous chart and ER physician.   HPI: Jerry [name] is a 81 y.o. male with history of chronic atrial fibrillation on Apixaban, previous stroke, history of traumatic subarachnoid hemorrhage, hypertension, chronic kidney disease was brought to the ER to patient's family found the patient was increasingly confused today. Patient has been recently having frequent falls and was brought to the ER 2 weeks ago after patient sustained injury to his left hand and elbow. Patient had sutures placed on his finger and was discharged back to assisted living to study. Today patient's family found the patient was confused and was brought to the ER. Patient also has been having some jerking spells. Patient did not lose consciousness as per the family. CT of the head and neck shows fractures of the ribs and clavicle. On my exam patient is oriented to his name only. Lab work shows UTI. Patient is being admitted for further management.   Review of Systems: As presented in the history of presenting illness, rest negative.  Past Medical History  Diagnosis Date  . Hypertension   . Arthritis   . Hernia   . Dysrhythmia     atrial fibrillation  . Head injury   . Humerus fracture     right  . Asthma     as a child  . Pneumonia   . Stroke     "light stroke"  . H/O hiatal hernia   . Stroke 2016  . A-fib   . Back pain    Past Surgical History  Procedure Laterality Date  . Finger amputation Left     little  . Orif humerus fracture Right 09/15/2013    Procedure: OPEN REDUCTION INTERNAL FIXATION (ORIF) RIGHT HUMERAL SHAFT FRACTURE;  Surgeon: Cheral Almas, MD;  Location: [facility] OR;  Service: Orthopedics;  Laterality: Right;   Social History:   reports that he has never smoked. He has quit using smokeless tobacco. His smokeless tobacco use included Chew. He reports that he drinks alcohol. His drug history is not on file. Where does patient live  at assisted living facility. Can patient participate in ADLs?  No.  Allergies  Allergen Reactions  . Hydrocodone Other (See Comments)    Reaction unknown  . Penicillins Other (See Comments)    REACTION: unknown  . Tetanus Toxoids Other (See Comments)    unknown    Family History:  Family History  Problem Relation Age of Onset  . Heart attack Brother       Prior to Admission medications   Medication Sig Start Date End Date Taking? Authorizing Provider  amLODipine (NORVASC) 10 MG tablet Take 10 mg by mouth daily.   Yes Historical Provider, MD  apixaban (ELIQUIS) 5 MG TABS tablet Take 1 tablet (5 mg total) by mouth 2 (two) times daily. 12/14/14  Yes Richarda Overlie, MD  atorvastatin (LIPITOR) 40 MG tablet Take 1 tablet (40 mg total) by mouth daily at 6 PM. 12/14/14  Yes Richarda Overlie, MD  metoprolol (LOPRESSOR) 50 MG tablet Take 0.5 tablets (25 mg total) by mouth daily. 12/14/14  Yes Richarda Overlie, MD  sulfamethoxazole-trimethoprim (BACTRIM DS,SEPTRA DS) 800-160 MG per tablet Take 1 tablet by mouth 2 (two) times daily. Started medication on 03-03-15   Yes Historical Provider, MD  traMADol (ULTRAM) 50 MG tablet  Take 1 tablet (50 mg total) by mouth every 6 (six) hours as needed. Patient taking differently: Take 50 mg by mouth 3 (three) times daily.  12/14/14  Yes Richarda Overlie, MD  vitamin B-12 1000 MCG tablet Take 1 tablet (1,000 mcg total) by mouth daily. 12/14/14  Yes Richarda Overlie, MD    Physical Exam: Filed Vitals:   03/11/15 2015 03/11/15 2100 03/11/15 2115 03/11/15 2155  BP: 118/99 110/96 120/95 114/69  Pulse: 96 98  107  Temp:    97.7 F (36.5 C)  TempSrc:    Oral  Resp:  21 19 20   Height:    5\' 9"  (1.753 m)  Weight:    65.1 kg (143 lb 8.3 oz)  SpO2: 94% 94%  92%     General:   Moderately built and nourished.  Eyes:  Anicteric. No pallor.  ENT:  No discharge from the ears eyes nose and mouth.  Neck:  No mass felt. No neck rigidity.  Cardiovascular:  S1 and S2 heard.  Respiratory:  No rhonchi or crepitations.  Abdomen:  Soft nontender bowel sounds present.  Skin:  Bruise on his left hand and left elbow.  Musculoskeletal:  No edema.  Psychiatric:  Patient is oriented to his name.  Neurologic:  Patient is oriented to his name and moves all extremities.  Labs on Admission:  Basic Metabolic Panel:  Recent Labs Lab 03/11/15 1905  NA 136  K 4.7  CL 105  CO2 22  GLUCOSE 106*  BUN 15  CREATININE 1.46*  CALCIUM 8.9   Liver Function Tests:  Recent Labs Lab 03/11/15 1905  AST 22  ALT 15*  ALKPHOS 91  BILITOT 0.8  PROT 6.3*  ALBUMIN 3.1*   No results for input(s): LIPASE, AMYLASE in the last 168 hours. No results for input(s): AMMONIA in the last 168 hours. CBC:  Recent Labs Lab 03/11/15 1905  WBC 8.9  NEUTROABS 5.3  HGB 12.2*  HCT 36.3*  MCV 81.2  PLT 175   Cardiac Enzymes: No results for input(s): CKTOTAL, CKMB, CKMBINDEX, TROPONINI in the last 168 hours.  BNP (last 3 results) No results for input(s): BNP in the last 8760 hours.  ProBNP (last 3 results) No results for input(s): PROBNP in the last 8760 hours.  CBG: No results for input(s): GLUCAP in the last 168 hours.  Radiological Exams on Admission: Dg Chest 1 View  03/11/2015   CLINICAL DATA:  Altered mental status and chest pain  EXAM: CHEST  1 VIEW  COMPARISON:  03/04/2015  FINDINGS: Cardiac shadow is mildly enlarged. The known bilateral rib fractures are not well appreciated on this exam. The lungs are well-aerated without focal infiltrate or sizable effusion. Mild interstitial changes are again seen. Postsurgical changes in the right humerus are noted.  IMPRESSION: No acute abnormality noted.   Electronically Signed   By: Alcide Clever M.D.   On: 03/11/2015 20:01    Ct Head Wo Contrast  03/11/2015   CLINICAL DATA:  Multiple falls in last few weeks, initial encounter  EXAM: CT HEAD WITHOUT CONTRAST  CT CERVICAL SPINE WITHOUT CONTRAST  TECHNIQUE: Multidetector CT imaging of the head and cervical spine was performed following the standard protocol without intravenous contrast. Multiplanar CT image reconstructions of the cervical spine were also generated.  COMPARISON:  02/14/2015  FINDINGS: CT HEAD FINDINGS  The bony calvarium is intact. Diffuse atrophic and chronic white matter ischemic changes are seen. There are changes of prior infarcts identified within the right temporal  lobe and extending superiorly into the right basal ganglia and centrum semi ovale. No findings to suggest acute hemorrhage, acute infarction or space-occupying mass lesion are noted.  CT CERVICAL SPINE FINDINGS  The examination is somewhat limited by patient motion artifact. Multilevel facet hypertrophic changes are seen. Vertebral body height is well maintained. Osteophytic changes are noted worst at the C5-6 and C6-7 level. Changes consistent with healing fracture of the left second rib posteriorly are seen. The visualized lung apices are within normal limits. The surrounding soft tissues show carotid calcifications without acute abnormality. Seen only on the sagittal reconstructions there is an undisplaced fracture of the left clavicular head.  IMPRESSION: CT of the head: Chronic atrophic and ischemic changes as well as findings of prior infarcts. These changes are stable from the prior study.  CT of the cervical spine:  Healing left second rib fracture.  Left clavicular head fracture without significant displacement. No other focal acute bony abnormality is noted.  Multilevel degenerative change .  Multilevel degenerative change without acute abnormality.   Electronically Signed   By: Alcide Clever M.D.   On: 03/11/2015 20:17   Ct Cervical Spine Wo Contrast  03/11/2015   CLINICAL DATA:  Multiple  falls in last few weeks, initial encounter  EXAM: CT HEAD WITHOUT CONTRAST  CT CERVICAL SPINE WITHOUT CONTRAST  TECHNIQUE: Multidetector CT imaging of the head and cervical spine was performed following the standard protocol without intravenous contrast. Multiplanar CT image reconstructions of the cervical spine were also generated.  COMPARISON:  02/14/2015  FINDINGS: CT HEAD FINDINGS  The bony calvarium is intact. Diffuse atrophic and chronic white matter ischemic changes are seen. There are changes of prior infarcts identified within the right temporal lobe and extending superiorly into the right basal ganglia and centrum semi ovale. No findings to suggest acute hemorrhage, acute infarction or space-occupying mass lesion are noted.  CT CERVICAL SPINE FINDINGS  The examination is somewhat limited by patient motion artifact. Multilevel facet hypertrophic changes are seen. Vertebral body height is well maintained. Osteophytic changes are noted worst at the C5-6 and C6-7 level. Changes consistent with healing fracture of the left second rib posteriorly are seen. The visualized lung apices are within normal limits. The surrounding soft tissues show carotid calcifications without acute abnormality. Seen only on the sagittal reconstructions there is an undisplaced fracture of the left clavicular head.  IMPRESSION: CT of the head: Chronic atrophic and ischemic changes as well as findings of prior infarcts. These changes are stable from the prior study.  CT of the cervical spine:  Healing left second rib fracture.  Left clavicular head fracture without significant displacement. No other focal acute bony abnormality is noted.  Multilevel degenerative change .  Multilevel degenerative change without acute abnormality.   Electronically Signed   By: Alcide Clever M.D.   On: 03/11/2015 20:17    EKG: Independently reviewed.  Atrial fibrillation rate controlled.  Assessment/Plan Principal Problem:   Acute  encephalopathy Active Problems:   Essential hypertension   Urinary tract infection   Chronic atrial fibrillation  1. Acute encephalopathy - may be secondary to UTI. But since it was sudden in onset I have ordered MRI brain and EEG. Continue with ceftriaxone for UTI. Check ammonia levels and further recommendation based on MRI findings. Get physical therapy consult if patient needs advanced level of care. 2. UTI - on ceftriaxone. 3. Hypertension - continue home medications. 4. Frequent falls - . Physical therapy consult. 5. Chronic atrial fibrillation -  on Apixaban. Presently rate controlled. 6. Hyperlipidemia - continue present medications. 7. Chronic kidney disease stage III - creatinine appears to be at baseline. 8. History of traumatic subarachnoid hemorrhage.    DVT Prophylaxis Apixaban. Code Status: DO NOT RESUSCITATE.  Family Communication: Patient's daughter.  Disposition Plan: Admit to inpatient.    Tyshon Fanning N. [facility] Pager [phone].  If 7PM-7AM, please contact night-coverage [email or web address] Password [facility] 03/11/2015, 10:31 PM

## 2015-03-11 NOTE — ED Notes (Signed)
Pt brought in via [facility] from [employer] [facility]. Pt had a fall two weeks ago and was seen in hospital for suturing and stitches. Pt went back to facility and started removing sutures from hand. Pt developed an infection and was placed on bactrim. Pt recently became more confused, and fell twice. Nurse at facility noticed pts urine was dark, and malodorous. Pt seen today for possible UTI follow-up.

## 2015-03-12 ENCOUNTER — Inpatient Hospital Stay (HOSPITAL_COMMUNITY): Payer: Medicare Other

## 2015-03-12 DIAGNOSIS — I10 Essential (primary) hypertension: Secondary | ICD-10-CM

## 2015-03-12 DIAGNOSIS — I639 Cerebral infarction, unspecified: Secondary | ICD-10-CM

## 2015-03-12 DIAGNOSIS — I482 Chronic atrial fibrillation: Secondary | ICD-10-CM

## 2015-03-12 DIAGNOSIS — N3000 Acute cystitis without hematuria: Secondary | ICD-10-CM

## 2015-03-12 LAB — CBC WITH DIFFERENTIAL/PLATELET
Basophils Absolute: 0.1 10*3/uL (ref 0.0–0.1)
Basophils Relative: 1 % (ref 0–1)
Eosinophils Absolute: 0.1 10*3/uL (ref 0.0–0.7)
Eosinophils Relative: 2 % (ref 0–5)
HEMATOCRIT: 34 % — AB (ref 39.0–52.0)
Hemoglobin: 11.4 g/dL — ABNORMAL LOW (ref 13.0–17.0)
LYMPHS PCT: 27 % (ref 12–46)
Lymphs Abs: 1.9 10*3/uL (ref 0.7–4.0)
MCH: 27.1 pg (ref 26.0–34.0)
MCHC: 33.5 g/dL (ref 30.0–36.0)
MCV: 81 fL (ref 78.0–100.0)
MONO ABS: 1.2 10*3/uL — AB (ref 0.1–1.0)
Monocytes Relative: 17 % — ABNORMAL HIGH (ref 3–12)
Neutro Abs: 3.6 10*3/uL (ref 1.7–7.7)
Neutrophils Relative %: 52 % (ref 43–77)
Platelets: 172 10*3/uL (ref 150–400)
RBC: 4.2 MIL/uL — AB (ref 4.22–5.81)
RDW: 14.5 % (ref 11.5–15.5)
WBC: 6.9 10*3/uL (ref 4.0–10.5)

## 2015-03-12 LAB — RAPID URINE DRUG SCREEN, HOSP PERFORMED
Amphetamines: NOT DETECTED
BARBITURATES: NOT DETECTED
Benzodiazepines: NOT DETECTED
Cocaine: NOT DETECTED
Opiates: NOT DETECTED
Tetrahydrocannabinol: NOT DETECTED

## 2015-03-12 LAB — COMPREHENSIVE METABOLIC PANEL
ALK PHOS: 86 U/L (ref 38–126)
ALT: 13 U/L — AB (ref 17–63)
AST: 18 U/L (ref 15–41)
Albumin: 2.8 g/dL — ABNORMAL LOW (ref 3.5–5.0)
Anion gap: 8 (ref 5–15)
BILIRUBIN TOTAL: 0.8 mg/dL (ref 0.3–1.2)
BUN: 14 mg/dL (ref 6–20)
CO2: 23 mmol/L (ref 22–32)
CREATININE: 1.35 mg/dL — AB (ref 0.61–1.24)
Calcium: 8.5 mg/dL — ABNORMAL LOW (ref 8.9–10.3)
Chloride: 107 mmol/L (ref 101–111)
GFR calc non Af Amer: 45 mL/min — ABNORMAL LOW (ref >60)
GFR, EST AFRICAN AMERICAN: 52 mL/min — AB (ref >60)
GLUCOSE: 95 mg/dL (ref 65–99)
Potassium: 4.1 mmol/L (ref 3.5–5.1)
SODIUM: 138 mmol/L (ref 135–145)
Total Protein: 5.8 g/dL — ABNORMAL LOW (ref 6.5–8.1)

## 2015-03-12 LAB — CBC
HEMATOCRIT: 35.6 % — AB (ref 39.0–52.0)
HEMOGLOBIN: 11.9 g/dL — AB (ref 13.0–17.0)
MCH: 27 pg (ref 26.0–34.0)
MCHC: 33.4 g/dL (ref 30.0–36.0)
MCV: 80.9 fL (ref 78.0–100.0)
Platelets: 170 10*3/uL (ref 150–400)
RBC: 4.4 MIL/uL (ref 4.22–5.81)
RDW: 14.5 % (ref 11.5–15.5)
WBC: 7.4 10*3/uL (ref 4.0–10.5)

## 2015-03-12 LAB — APTT
aPTT: 72 seconds — ABNORMAL HIGH (ref 24–37)
aPTT: 90 seconds — ABNORMAL HIGH (ref 24–37)

## 2015-03-12 LAB — GLUCOSE, CAPILLARY
GLUCOSE-CAPILLARY: 98 mg/dL (ref 65–99)
Glucose-Capillary: 85 mg/dL (ref 65–99)
Glucose-Capillary: 92 mg/dL (ref 65–99)
Glucose-Capillary: 100 mg/dL — ABNORMAL HIGH (ref 65–99)

## 2015-03-12 LAB — AMMONIA: Ammonia: 11 umol/L (ref 9–35)

## 2015-03-12 LAB — HEPARIN LEVEL (UNFRACTIONATED)
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
## 2015-03-12 NOTE — Consult Note (Signed)
Referring Physician: Dr Catha Gosselin    Chief Complaint:   HPI: Jerry [name] is an 81 y.o. male with chronic atrial fibrillation on Eliquis, previous stroke, history of traumatic subarachnoid hemorrhage, hypertension, and chronic kidney disease admitted to [facility] 03/11/2015 with increased confusion. An MRI performed today revealed a 3 mm acute white matter infarction of the right posterior frontal lobe at the vertex in the deep white matter as well as a late subacute infarction in the right temporal lobe with some residual internal areas of restricted diffusion.    In March of this year the patient had an MRI that showed an acute large right basal ganglia acute infarct with small bilateral chronic holo hemispheric subdural hematomas.  An MRA showed near complete loss of mid basilar flow related enhancement which could reflect high-grade stenosis and/or artifact.   Currently there are no family members present. The patient is significantly confused and is unable to give any pertinent history.   Date last known well: Unable to determine Time last known well: Unable to determine tPA Given: No unknown time of onset and history of subdural hematomas   Past Medical History  Diagnosis Date  . Hypertension   . Arthritis   . Hernia   . Dysrhythmia     atrial fibrillation  . Head injury   . Humerus fracture     right  . Asthma     as a child  . Pneumonia   . Stroke     "light stroke"  . H/O hiatal hernia   . Stroke 2016  . A-fib   . Back pain     Past Surgical History  Procedure Laterality Date  . Finger amputation Left     little  . Orif humerus fracture Right 09/15/2013    Procedure: OPEN REDUCTION INTERNAL FIXATION (ORIF) RIGHT HUMERAL SHAFT FRACTURE;  Surgeon: Cheral Almas, MD;  Location: [facility] OR;  Service: Orthopedics;  Laterality: Right;    Family History  Problem Relation Age of Onset  . Heart attack Brother    Social History:  reports that he has never  smoked. He has quit using smokeless tobacco. His smokeless tobacco use included Chew. He reports that he drinks alcohol. His drug history is not on file.  Allergies:  Allergies  Allergen Reactions  . Hydrocodone Other (See Comments)    Reaction unknown  . Penicillins Other (See Comments)    REACTION: unknown  . Tetanus Toxoids Other (See Comments)    unknown    Medications:  Scheduled: . amLODipine  10 mg Oral Daily  . atorvastatin  40 mg Oral q1800  . cefTRIAXone (ROCEPHIN)  IV  1 g Intravenous Q24H  . metoprolol  25 mg Oral Daily    ROS: Unobtainable at this time secondary to the patient's confusion.    Physical Examination: Blood pressure 124/65, pulse 79, temperature 98.1 F (36.7 C), temperature source Oral, resp. rate 19, height  (1.753 m), weight 65.1 kg (143 lb 8.3 oz), SpO2 97 %.  General - pleasantly confused 81 year old male in no acute distress. Eyes - noninjected sclera ENT- no op obstruction Heart - irregularly irregular Lungs - Clear to auscultation Abdomen - Soft - non tender Extremities - Distal pulses weak to absent. No edema Skin - Warm and dry  Mental Status: Alert, the patient believes he is at home. He does not know the aunt or year.  Speech fluent without evidence of aphasia.  Able to follow simple commands without difficulty.  Cranial Nerves: II: Discs not visualized; Visual fields could not be tested, pupils equal, round, reactive to light and accommodation III,IV, VI: ptosis on the right, extra-ocular motions intact bilaterally V,VII: smile symmetric, facial light touch sensation normal bilaterally VIII: hearing normal bilaterally IX,X: gag reflex present XI: bilateral shoulder shrug XII: midline tongue extension Motor: Right : Upper extremity   5/5    Left:     Upper extremity   5/5  Lower extremity   5/5     Lower extremity   5/5 Tone and bulk:normal tone throughout; no atrophy noted Sensory: Light touch intact throughout,  bilaterally Deep Tendon Reflexes: 2+ both upper extremities. Lower ext DTRs could not be elicited secondary to patient's inability to relax. Plantars: Right: downgoing   Left: downgoing Cerebellar: normal finger-to-nose with bilateral tremor Gait: Not attempted for safety reasons    Laboratory Studies:  Basic Metabolic Panel:  Recent Labs Lab 03/11/15 1905 03/12/15 0328  NA 136 138  K 4.7 4.1  CL 105 107  CO2 22 23  GLUCOSE 106* 95  BUN 15 14  CREATININE 1.46* 1.35*  CALCIUM 8.9 8.5*    Liver Function Tests:  Recent Labs Lab 03/11/15 1905 03/12/15 0328  AST 22 18  ALT 15* 13*  ALKPHOS 91 86  BILITOT 0.8 0.8  PROT 6.3* 5.8*  ALBUMIN 3.1* 2.8*   No results for input(s): LIPASE, AMYLASE in the last 168 hours.  Recent Labs Lab 03/11/15 2322  AMMONIA 11    CBC:  Recent Labs Lab 03/11/15 1905 03/12/15 0328 03/12/15 0912  WBC 8.9 6.9 7.4  NEUTROABS 5.3 3.6  --   HGB 12.2* 11.4* 11.9*  HCT 36.3* 34.0* 35.6*  MCV 81.2 81.0 80.9  PLT 175 172 170    Cardiac Enzymes: No results for input(s): CKTOTAL, CKMB, CKMBINDEX, TROPONINI in the last 168 hours.  BNP: Invalid input(s): POCBNP  CBG:  Recent Labs Lab 03/11/15 2241 03/12/15 0839 03/12/15 1219  GLUCAP 119* 85 92    Microbiology: Results for orders placed or performed during the hospital encounter of 12/11/14  Urine culture     Status: None   Collection Time: 12/12/14  3:49 PM  Result Value Ref Range Status   Specimen Description URINE, CLEAN CATCH  Final   Special Requests NONE  Final   Colony Count   Final    85,000 COLONIES/ML Performed at [facility]    Culture   Final    Multiple bacterial morphotypes present, none predominant. Suggest appropriate recollection if clinically indicated. Performed at [facility]    Report Status 12/13/2014 FINAL  Final    Coagulation Studies: No results for input(s): LABPROT, INR in the last 72 hours.  Urinalysis:  Recent  Labs Lab 03/11/15 1829  COLORURINE YELLOW  LABSPEC 1.016  PHURINE 5.5  GLUCOSEU NEGATIVE  HGBUR LARGE*  BILIRUBINUR NEGATIVE  KETONESUR NEGATIVE  PROTEINUR NEGATIVE  UROBILINOGEN 0.2  NITRITE NEGATIVE  LEUKOCYTESUR MODERATE*    Lipid Panel:    Component Value Date/Time   CHOL 170 12/12/2014 0616   TRIG 90 12/12/2014 0616   HDL 28* 12/12/2014 0616   CHOLHDL 6.1 12/12/2014 0616   VLDL 18 12/12/2014 0616   LDLCALC 124* 12/12/2014 0616    HgbA1C:  Lab Results  Component Value Date   HGBA1C 4.9 12/12/2014    Urine Drug Screen:     Component Value Date/Time   LABOPIA NONE DETECTED 03/12/2015 0527   COCAINSCRNUR NONE DETECTED 03/12/2015 0527   LABBENZ NONE  DETECTED 03/12/2015 0527   AMPHETMU NONE DETECTED 03/12/2015 0527   THCU NONE DETECTED 03/12/2015 0527   LABBARB NONE DETECTED 03/12/2015 0527    Alcohol Level: No results for input(s): ETH in the last 168 hours.  Other results: EKG: Atrial fibrillation. Ventricular response 98 bpm. Please see the formal cardiology reading for complete details.  Imaging:   Dg Chest 1 View 03/11/2015    No acute abnormality noted.     Ct Head Wo Contrast 03/11/2015    Chronic atrophic and ischemic changes as well as findings of prior infarcts. These changes are stable from the prior study.     Ct Cervical Spine Wo Contrast 03/11/2015    Healing left second rib fracture.  Left clavicular head fracture without significant displacement. No other focal acute bony abnormality is noted.  Multilevel degenerative change .  Multilevel degenerative change without acute abnormality.      Mr Brain Wo Contrast 03/12/2015    3 mm acute white matter infarction of the right posterior frontal lobe at the vertex in the deep white matter.   Late subacute infarction in the right temporal lobe with some residual internal areas of restricted diffusion.   Extensive atrophic and ischemic changes of a chronic nature elsewhere throughout the brain.   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 Thin subdural hygromas without mass effect.      CTA head and neck 12/13/2014 No proximal anterior circulation stenosis or large vessel occlusion. 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  MRI/MRA 12/11/2014 Acute large RIGHT basal ganglia acute infarct corresponding to CT abnormality.  Small bilateral chronic holo hemispheric subdural hematomas. Bifrontal hemosiderin staining consistent with remote subarachnoid hemorrhage.  Multifocal encephalomalacia may be posttraumatic or ischemic. Moderate to severe white matter changes suggest chronic small vessel ischemic disease. At least mid grade stenosis the RIGHT V4 segment. Near complete loss of mid basilar flow related enhancement which may reflect high-grade stenosis and/or artifact. Complete circle of Willis.  No large vessel occlusion, limited assessment of the mid to distal cerebral arteries due to motion. Findings would be better characterized on CTA of the head, as patient had difficulty remaining still for MRA.    Assessment: 81 y.o. male with a history of previous infarcts as well as bilateral chronic subdural hematomas on Eliquis for atrial fibrillation. Now admitted with confusion and new infarcts as described on MRI above.  Stroke Risk Factors - atrial fibrillation, hypertension and Previous strokes  Plan: 1. HgbA1c, fasting lipid panel 2. PT consult, OT consult, Speech consult 3. Prophylactic therapy-Eliquis 4. NPO until RN stroke swallow screen 5. Frequent neuro checks   Hassel Neth [facility] Pager [phone] 03/12/2015, 5:21 PM  I have seen and evaluated the patient. I have reviewed the above note and made appropriate changes.   81 year old male with altered mental status in the setting of urinary tract infection. I  suspect that the tiny infarcts seen on MRI is incidental, though could be contributory to multifactorial delirium. I favor continuing eliquis at this time.   Ritta Slot, MD [facility] [phone]  If 7pm- 7am, please page neurology on call as listed in AMION.

## 2015-03-12 NOTE — Progress Notes (Addendum)
ANTICOAGULATION CONSULT NOTE - Follow Up Consult  Pharmacy Consult for Heparin Indication: atrial fibrillation  Allergies  Allergen Reactions  . Hydrocodone Other (See Comments)    Reaction unknown  . Penicillins Other (See Comments)    REACTION: unknown  . Tetanus Toxoids Other (See Comments)    unknown    Patient Measurements: Height: 5\' 9"  (175.3 cm) Weight: 143 lb 8.3 oz (65.1 kg) IBW/kg (Calculated) : 70.7 Heparin Dosing Weight:   Vital Signs: Temp: 97.7 F (36.5 C) (06/17 1012) Temp Source: Oral (06/17 1012) BP: 142/72 mmHg (06/17 1012) Pulse Rate: 103 (06/17 1012)  Labs:  Recent Labs  03/11/15 1905 03/12/15 0328 03/12/15 0912  HGB 12.2* 11.4* 11.9*  HCT 36.3* 34.0* 35.6*  PLT 175 172 170  APTT  --   --  72*  HEPARINUNFRC  --   --  >2.20*  CREATININE 1.46* 1.35*  --     Estimated Creatinine Clearance: 34.8 mL/min (by C-G formula based on Cr of 1.35).   Medications:  Scheduled:  . amLODipine  10 mg Oral Daily  . atorvastatin  40 mg Oral q1800  . cefTRIAXone (ROCEPHIN)  IV  1 g Intravenous Q24H  . metoprolol  25 mg Oral Daily    Assessment: 81yo male with AFib, bridging with Heparin while Apixaban on hold.  Heparin level > 2.2 and aPTT 72 sec this AM, elevated HL reflecting continued Apixaban effect and need to monitor heparin via aPTT for now.  Hg and Pltc are wnl.  No bleeding noted.  Goal of Therapy:  Heparin level 0.3-0.7 units/ml aPTT 66-102 seconds Monitor platelets by anticoagulation protocol: Yes   Plan:  Continue Heparin 900 units/hr Repeat PTT this afternoon to verify therapeutic x 2 Watch for s/s of bleeding Daily HL and PTT  Marisue Humble, PharmD Clinical Pharmacist [facility]  *   *   *   *   * ADDENDUM:     Heparin infusing at 900  Units/hr  6 hour aPTT Follow-up lab = 90 seconds, within Therapeutic Window  Goal for aPTT 66 - 102 seconds.  No evidence of bleeding complications noted    PLAN: 1. Continue Heparin infusion at 900 units/hr 2. Will transition to Heparin levels in AM with AM labs. 3. Daily heparin level and CBC.  Follow Platelets. Monitor for bleeding complications.   Velda Shell,  Pharm.D   03/12/2015,  4:21 PM

## 2015-03-12 NOTE — Evaluation (Signed)
SLP Cancellation Note  Patient Details Name: Jerry [name] MRN: [id number] DOB: [birth year]   Cancelled treatment:       Reason Eval/Treat Not Completed: Other (comment);Patient at procedure or test/unavailable  Donavan Burnet, MS [facility] SLP [phone]
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
## 2015-03-12 NOTE — Progress Notes (Signed)
[facility]                                                                              Patient Demographics  Jerry [name], is a 81 y.o. male, DOB - 04/24/1924, ZOX:[id number]  Admit date - 03/11/2015   Admitting Physician Eduard Clos, MD  Outpatient Primary MD for the patient is [name], MD  LOS - 1   Chief Complaint  Patient presents with  . Altered Mental Status      HPI on 03/11/2015 by Dr. Midge Minium Jerry [name] is a 81 y.o. male with history of chronic atrial fibrillation on Apixaban, previous stroke, history of traumatic subarachnoid hemorrhage, hypertension, chronic kidney disease was brought to the ER to patient's family found the patient was increasingly confused today. Patient has been recently having frequent falls and was brought to the ER 2 weeks ago after patient sustained injury to his left hand and elbow. Patient had sutures placed on his finger and was discharged back to assisted living to study. Today patient's family found the patient was confused and was brought to the ER. Patient also has been having some jerking spells. Patient did not lose consciousness as per the family. CT of the head and neck shows fractures of the ribs and clavicle. On my exam patient is oriented to his name only. Lab work shows UTI. Patient is being admitted for further management.  Assessment & Plan   Acute encephalopathy/Acute CVA  -Possibly secondary to urinary tract infection -CT head: Chronic atrophic and ischemic changes -MRI brain: 3mm acute white matter infarction -Neurology consulted and appreciated -Will obtain Hemoglobin A1c (in March 2016: 4.9), LDL -Currently on heparin -Echocardiogram 12/14/2014: EF45-50% -PT/OT consulted -Speech recommended dysphagia 3 diet  Urinary tract infection -Urine cultures pending -Continue ceftriaxone  Hypertension -Continue metoprolol, amlodipine  Frequent falls -PT consulted -Patient has noted healing  left 2nd rib fracture  Chronic atrial fibrillation -On Eliquis at home, however, held and patient placed on heparin -Would consider stopping anticoagulation given age and fall risk, will speak with family  Hyperlipidemia -Continue statin  Chronic kidney disease -Appears to be at baseline, continue to monitor BMP  History of traumatic subarachnoid hemorrhage  Code Status: Full  Family Communication: None at bedside  Disposition Plan: Admitted  Time Spent in minutes   30 minutes  Procedures  None  Consults   Neurology   DVT Prophylaxis  Heparin  Lab Results  Component Value Date   PLT 170 03/12/2015    Medications  Scheduled Meds: . amLODipine  10 mg Oral Daily  . atorvastatin  40 mg Oral q1800  . cefTRIAXone (ROCEPHIN)  IV  1 g Intravenous Q24H  . metoprolol  25 mg Oral Daily   Continuous Infusions: . sodium chloride 50 mL/hr at 03/11/15 2318  . heparin 900 Units/hr (03/12/15 0029)   PRN Meds:.traMADol  Antibiotics    Anti-infectives    Start     Dose/Rate Route Frequency Ordered Stop   03/12/15 2130  cefTRIAXone (ROCEPHIN) 1 g in dextrose 5 % 50 mL IVPB - Premix     1 g 100 mL/hr over 30 Minutes Intravenous Every 24 hours 03/11/15 2236  03/11/15 2045  cefTRIAXone (ROCEPHIN) 1 g in dextrose 5 % 50 mL IVPB     1 g 100 mL/hr over 30 Minutes Intravenous  Once 03/11/15 2041 03/12/15 0034        Subjective:   Jerry [name] seen and examined today.  Patient has no complaints.  He states he falls and has many bruises. He denies shortness of breath, chest pain, abdominal pain, headache, dizziness.   Objective:   Filed Vitals:   03/12/15 0156 03/12/15 0500 03/12/15 1012 03/12/15 1321  BP: 130/102 120/60 142/72 124/65  Pulse: 98 103 103 79  Temp: 98.1 F (36.7 C) 97.7 F (36.5 C) 97.7 F (36.5 C) 98.1 F (36.7 C)  TempSrc: Oral Axillary Oral Oral  Resp: 18 19 18 19   Height:      Weight:      SpO2: 94% 96% 99% 97%    Wt Readings from Last 3  Encounters:  03/11/15 65.1 kg (143 lb 8.3 oz)  02/08/15 67.586 kg (149 lb)  12/12/14 71.6 kg (157 lb 13.6 oz)     Intake/Output Summary (Last 24 hours) at 03/12/15 1501 Last data filed at 03/12/15 0600  Gross per 24 hour  Intake 340.17 ml  Output      0 ml  Net 340.17 ml    Exam  General: Well developed, thin, NAD, appears stated age  HEENT: NCAT, mucous membranes moist.   Cardiovascular: S1 S2 auscultated, irregular  Respiratory: Clear to auscultation bilaterally  Abdomen: Soft, nontender, nondistended, + bowel sounds  Extremities: warm dry without cyanosis clubbing or edema  Neuro: AAO to self.  CN intact as tested.  Skin: Multiple bruises and scabs  Data Review   Micro Results No results found for this or any previous visit (from the past 240 hour(s)).  Radiology Reports Dg Chest 1 View  03/11/2015   CLINICAL DATA:  Altered mental status and chest pain  EXAM: CHEST  1 VIEW  COMPARISON:  03/04/2015  FINDINGS: Cardiac shadow is mildly enlarged. The known bilateral rib fractures are not well appreciated on this exam. The lungs are well-aerated without focal infiltrate or sizable effusion. Mild interstitial changes are again seen. Postsurgical changes in the right humerus are noted.  IMPRESSION: No acute abnormality noted.   Electronically Signed   By: Alcide Clever M.D.   On: 03/11/2015 20:01   Dg Chest 2 View  02/14/2015   CLINICAL DATA:  Patient with injury to the finger. No chest complaints.  EXAM: CHEST  2 VIEW  COMPARISON:  Chest radiograph 12/11/2014  FINDINGS: Stable enlarged cardiac and mediastinal contours. No consolidative pulmonary opacities. Calcified granuloma left upper lung. Mid thoracic spine degenerative changes. Plate and screw hardware proximal right humerus.  IMPRESSION: No acute cardiopulmonary process.   Electronically Signed   By: Annia Belt M.D.   On: 02/14/2015 21:05   Dg Wrist Complete Right  02/14/2015   CLINICAL DATA:  Right wrist pain.  EXAM:  RIGHT WRIST - COMPLETE 3+ VIEW  COMPARISON:  None.  FINDINGS: No fracture or dislocation. There is degenerative change involving the scapholunate articulation. No definite erosions. No evidence of chondrocalcinosis. Adjacent vascular calcifications. No definite displacement of the pronator quadratus fat pad. Regional soft tissues appear normal. There is a punctate (approximately 3 mm) ossicle within the soft tissues of the imaged mid aspect of the palm. No associated subcutaneous emphysema. No radiopaque foreign body.  IMPRESSION: 1. No acute findings. 2. Degenerative change involving the scapholunate articulation, presumably degenerative in etiology  though could be seen in the setting of an inflammatory arthropathy. Clinical correlation is advised. 3. Punctate (approximately 3 mm) ossicle within the subcutaneous tissues of the palm without associated subcutaneous emphysema. Clinical correlation is advised.   Electronically Signed   By: Simonne Come M.D.   On: 02/14/2015 21:09   Ct Head Wo Contrast  03/11/2015   CLINICAL DATA:  Multiple falls in last few weeks, initial encounter  EXAM: CT HEAD WITHOUT CONTRAST  CT CERVICAL SPINE WITHOUT CONTRAST  TECHNIQUE: Multidetector CT imaging of the head and cervical spine was performed following the standard protocol without intravenous contrast. Multiplanar CT image reconstructions of the cervical spine were also generated.  COMPARISON:  02/14/2015  FINDINGS: CT HEAD FINDINGS  The bony calvarium is intact. Diffuse atrophic and chronic white matter ischemic changes are seen. There are changes of prior infarcts identified within the right temporal lobe and extending superiorly into the right basal ganglia and centrum semi ovale. No findings to suggest acute hemorrhage, acute infarction or space-occupying mass lesion are noted.  CT CERVICAL SPINE FINDINGS  The examination is somewhat limited by patient motion artifact. Multilevel facet hypertrophic changes are seen. Vertebral  body height is well maintained. Osteophytic changes are noted worst at the C5-6 and C6-7 level. Changes consistent with healing fracture of the left second rib posteriorly are seen. The visualized lung apices are within normal limits. The surrounding soft tissues show carotid calcifications without acute abnormality. Seen only on the sagittal reconstructions there is an undisplaced fracture of the left clavicular head.  IMPRESSION: CT of the head: Chronic atrophic and ischemic changes as well as findings of prior infarcts. These changes are stable from the prior study.  CT of the cervical spine:  Healing left second rib fracture.  Left clavicular head fracture without significant displacement. No other focal acute bony abnormality is noted.  Multilevel degenerative change .  Multilevel degenerative change without acute abnormality.   Electronically Signed   By: Alcide Clever M.D.   On: 03/11/2015 20:17   Ct Head Wo Contrast  02/14/2015   CLINICAL DATA:  Post fall hitting back of head. Patient on blood thinners.  EXAM: CT HEAD WITHOUT CONTRAST  CT CERVICAL SPINE WITHOUT CONTRAST  TECHNIQUE: Multidetector CT imaging of the head and cervical spine was performed following the standard protocol without intravenous contrast. Multiplanar CT image reconstructions of the cervical spine were also generated.  COMPARISON:  12/18/2014  FINDINGS: CT HEAD FINDINGS  Regional soft tissues appear normal with special attention paid to the posterior aspect of the calvarium.  Similar findings of advanced atrophy with sulcal prominence and centralized volume loss with commensurate ex vacuo dilatation of the ventricular system. Stable sequela of prior infarction involving the anterior aspect of the right temporal lobe (image 10, series 21 as well as about the anterior horn of the right lateral ventricle (image 16, series 21) with unchanged ex vacuo dilatation of the right lateral ventricle. There is unchanged prominence of the bifrontal  extra-axial spaces. Unchanged scattered periventricular hypodensities compatible microvascular ischemic disease. Given extensive background parenchymal abnormalities, there is no CT evidence of superimposed acute large territory infarct. No intraparenchymal or extra-axial mass or hemorrhage. Unchanged size a configuration of the ventricles and basilar cisterns. No midline shift. Intracranial atherosclerosis.  Limited visualization of the paranasal sinuses and mastoid air cells is normal. No air-fluid levels. There is persistent leftward deviation of the nasal bone, likely the sequela of remote fracture. Post bilateral cataract surgery. No displaced calvarial fracture.  CT CERVICAL SPINE FINDINGS  C1 to the superior endplate of T1 is imaged.  Normal alignment of the cervical spine. No anterolisthesis or retrolisthesis. The bilateral facets are normally aligned. The dens is normally positioned between the lateral masses of C1. Moderate atlantodental degenerative change. Normal atlanto axial articulations.  No fracture or static subluxation of the cervical spine. Cervical vertebral body heights are preserved. Prevertebral soft tissues are normal.  Moderate multilevel cervical spine DDD, worse at C5-C6 and C6-C7 with disc space height loss, endplate irregularity and sclerosis.  There is partial ossification of the nuchal ligament posterior to the C5 and C6 spinous processes.  No bulky cervical lymphadenopathy on this noncontrast examination. Scattered atherosclerotic plaque within the bilateral carotid bulbs.  Limited visualization lung apices is normal.  IMPRESSION: 1. No acute intracranial process. 2. Sequela of advanced atrophy, microvascular ischemic disease and prior infarcts involving the right temporal lobe and right centrum semi ovale. 3. No fracture or static subluxation of the cervical spine. 4. Moderate multilevel cervical spine DDD, worse at C5-C6 and C6-C7.   Electronically Signed   By: Simonne Come M.D.    On: 02/14/2015 21:18   Ct Chest Wo Contrast  03/04/2015   CLINICAL DATA:  History of multiple falls, evaluate for rib lesion, former smoking history  EXAM: CT CHEST WITHOUT CONTRAST  TECHNIQUE: Multidetector CT imaging of the chest was performed following the standard protocol without IV contrast.  COMPARISON:  Chest x-ray of 02/14/2015  FINDINGS: Calcified granulomas are present within the left upper lobe consistent with prior granulomatous disease. Also there is some calcification within the spleen consistent with a benign process. No active infiltrate is seen. No pleural effusion is noted. The central airway is patent.  On bone window images, there are nondisplaced fractures of the anterior right second, third and fifth ribs with some callus formation present. On the left, there are fractures the anterior left third, fourth, fifth, and sixth ribs with some callus formation. There is a thoracic kyphosis present in the bones appear diffusely osteopenic. Very minimal compression of the superior endplate of T8 is present. The sternum appears intact.  On soft tissue window images, the thyroid gland is unremarkable. Atheromatous change is noted involving the aortic arch and descending thoracic aorta. No mediastinal or hilar adenopathy is seen. Coronary artery calcifications are present primarily within the left anterior descending artery and cardiomegaly is noted. There does appear to be a small hiatal hernia present.  IMPRESSION: 1. Subacute fractures of the anterior right second, third, and fifth ribs with subacute fractures of the anterior left third, fourth, fifth, and sixth ribs with some callus formation present. Diffuse osteopenia. 2. Changes of prior granulomatous these disease with granulomas in the left upper lobe. 3. Thoracic kyphosis. 4. Calcification of the left anterior descending coronary artery. 5. Small hiatal hernia.   Electronically Signed   By: Dwyane Dee M.D.   On: 03/04/2015 11:44   Ct  Cervical Spine Wo Contrast  03/11/2015   CLINICAL DATA:  Multiple falls in last few weeks, initial encounter  EXAM: CT HEAD WITHOUT CONTRAST  CT CERVICAL SPINE WITHOUT CONTRAST  TECHNIQUE: Multidetector CT imaging of the head and cervical spine was performed following the standard protocol without intravenous contrast. Multiplanar CT image reconstructions of the cervical spine were also generated.  COMPARISON:  02/14/2015  FINDINGS: CT HEAD FINDINGS  The bony calvarium is intact. Diffuse atrophic and chronic white matter ischemic changes are seen. There are changes of prior infarcts identified within  the right temporal lobe and extending superiorly into the right basal ganglia and centrum semi ovale. No findings to suggest acute hemorrhage, acute infarction or space-occupying mass lesion are noted.  CT CERVICAL SPINE FINDINGS  The examination is somewhat limited by patient motion artifact. Multilevel facet hypertrophic changes are seen. Vertebral body height is well maintained. Osteophytic changes are noted worst at the C5-6 and C6-7 level. Changes consistent with healing fracture of the left second rib posteriorly are seen. The visualized lung apices are within normal limits. The surrounding soft tissues show carotid calcifications without acute abnormality. Seen only on the sagittal reconstructions there is an undisplaced fracture of the left clavicular head.  IMPRESSION: CT of the head: Chronic atrophic and ischemic changes as well as findings of prior infarcts. These changes are stable from the prior study.  CT of the cervical spine:  Healing left second rib fracture.  Left clavicular head fracture without significant displacement. No other focal acute bony abnormality is noted.  Multilevel degenerative change .  Multilevel degenerative change without acute abnormality.   Electronically Signed   By: Alcide Clever M.D.   On: 03/11/2015 20:17   Ct Cervical Spine Wo Contrast  02/14/2015   CLINICAL DATA:  Post  fall hitting back of head. Patient on blood thinners.  EXAM: CT HEAD WITHOUT CONTRAST  CT CERVICAL SPINE WITHOUT CONTRAST  TECHNIQUE: Multidetector CT imaging of the head and cervical spine was performed following the standard protocol without intravenous contrast. Multiplanar CT image reconstructions of the cervical spine were also generated.  COMPARISON:  12/18/2014  FINDINGS: CT HEAD FINDINGS  Regional soft tissues appear normal with special attention paid to the posterior aspect of the calvarium.  Similar findings of advanced atrophy with sulcal prominence and centralized volume loss with commensurate ex vacuo dilatation of the ventricular system. Stable sequela of prior infarction involving the anterior aspect of the right temporal lobe (image 10, series 21 as well as about the anterior horn of the right lateral ventricle (image 16, series 21) with unchanged ex vacuo dilatation of the right lateral ventricle. There is unchanged prominence of the bifrontal extra-axial spaces. Unchanged scattered periventricular hypodensities compatible microvascular ischemic disease. Given extensive background parenchymal abnormalities, there is no CT evidence of superimposed acute large territory infarct. No intraparenchymal or extra-axial mass or hemorrhage. Unchanged size a configuration of the ventricles and basilar cisterns. No midline shift. Intracranial atherosclerosis.  Limited visualization of the paranasal sinuses and mastoid air cells is normal. No air-fluid levels. There is persistent leftward deviation of the nasal bone, likely the sequela of remote fracture. Post bilateral cataract surgery. No displaced calvarial fracture.  CT CERVICAL SPINE FINDINGS  C1 to the superior endplate of T1 is imaged.  Normal alignment of the cervical spine. No anterolisthesis or retrolisthesis. The bilateral facets are normally aligned. The dens is normally positioned between the lateral masses of C1. Moderate atlantodental degenerative  change. Normal atlanto axial articulations.  No fracture or static subluxation of the cervical spine. Cervical vertebral body heights are preserved. Prevertebral soft tissues are normal.  Moderate multilevel cervical spine DDD, worse at C5-C6 and C6-C7 with disc space height loss, endplate irregularity and sclerosis.  There is partial ossification of the nuchal ligament posterior to the C5 and C6 spinous processes.  No bulky cervical lymphadenopathy on this noncontrast examination. Scattered atherosclerotic plaque within the bilateral carotid bulbs.  Limited visualization lung apices is normal.  IMPRESSION: 1. No acute intracranial process. 2. Sequela of advanced atrophy, microvascular ischemic disease  and prior infarcts involving the right temporal lobe and right centrum semi ovale. 3. No fracture or static subluxation of the cervical spine. 4. Moderate multilevel cervical spine DDD, worse at C5-C6 and C6-C7.   Electronically Signed   By: Simonne Come M.D.   On: 02/14/2015 21:18   Mr Brain Wo Contrast  03/12/2015   CLINICAL DATA:  Acute encephalopathy. Worsening confusion acutely. Recent falls.  EXAM: MRI HEAD WITHOUT CONTRAST  TECHNIQUE: Multiplanar, multiecho pulse sequences of the brain and surrounding structures were obtained without intravenous contrast.  COMPARISON:  Head CT 03/11/2015.  MRI 12/12/2014.  FINDINGS: Diffusion imaging shows a 3 mm acute infarction in the deep white matter of the right posterior frontal region near the vertex. There are minimal chronic small-vessel changes of the brainstem. There are a few old small vessel cerebellar infarctions. There is cerebellar atrophy. Cerebral hemispheres show late subacute infarction in the right basal ganglia. There are a few areas of restricted diffusion internally in that area, not felt to represent true extensions or new infarctions. No acute cerebral hemispheric infarction is seen. The brain shows advanced generalized atrophy with chronic  small-vessel ischemic changes throughout the white matter. There is chronic atrophy and gliosis of the right temporal lobe and in the left frontal lobe. Hemosiderin is seen along the surface of the brain and in the left frontal and right temporal lobe consistent with previous intracranial hemorrhage. No sign of acute hemorrhage. No evidence of neoplastic mass lesion, obstructive hydrocephalus or extra-axial hematoma. There are then subdural hygromas, unchanged and without mass effect. No pituitary mass. No inflammatory sinus disease. There is a small amount of fluid in the mastoid air cells.  IMPRESSION: 3 mm acute white matter infarction of the right posterior frontal lobe at the vertex in the deep white matter.  Late subacute infarction in the right temporal lobe with some residual internal areas of restricted diffusion.  Extensive atrophic and ischemic changes of a chronic nature elsewhere throughout the brain. 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 Thin subdural hygromas without mass effect.   Electronically Signed   By: Paulina Fusi M.D.   On: 03/12/2015 08:38   Dg Hand Complete Left  02/14/2015   CLINICAL DATA:  Fourth digit laceration getting out of bed. Initial encounter.  EXAM: LEFT HAND - COMPLETE 3+ VIEW  COMPARISON:  None.  FINDINGS: No acute fracture or malalignment. When accounting for mineralization at the fourth digit, no opaque foreign body suspected.  Interphalangeal joint narrowing and spurring, expected for age. Ulnar positive variance with abutment of the lunate and distal ulna, developmental versus remote distal radius fracture.  Status post partial amputation of the middle finger.  IMPRESSION: No acute osseous findings.   Electronically Signed   By: Marnee Spring M.D.   On: 02/14/2015 21:07    CBC  Recent Labs Lab 03/11/15 1905 03/12/15 0328 03/12/15 0912  WBC 8.9 6.9 7.4  HGB 12.2* 11.4* 11.9*  HCT 36.3* 34.0* 35.6*  PLT 175 172 170  MCV 81.2  81.0 80.9  MCH 27.3 27.1 27.0  MCHC 33.6 33.5 33.4  RDW 14.5 14.5 14.5  LYMPHSABS 1.7 1.9  --   MONOABS 1.7* 1.2*  --   EOSABS 0.2 0.1  --   BASOSABS 0.1 0.1  --     Chemistries   Recent Labs Lab 03/11/15 1905 03/12/15 0328  NA 136 138  K 4.7 4.1  CL 105 107  CO2 22 23  GLUCOSE 106* 95  BUN 15 14  CREATININE 1.46* 1.35*  CALCIUM 8.9 8.5*  AST 22 18  ALT 15* 13*  ALKPHOS 91 86  BILITOT 0.8 0.8   ------------------------------------------------------------------------------------------------------------------ estimated creatinine clearance is 34.8 mL/min (by C-G formula based on Cr of 1.35). ------------------------------------------------------------------------------------------------------------------ No results for input(s): HGBA1C in the last 72 hours. ------------------------------------------------------------------------------------------------------------------ No results for input(s): CHOL, HDL, LDLCALC, TRIG, CHOLHDL, LDLDIRECT in the last 72 hours. ------------------------------------------------------------------------------------------------------------------ No results for input(s): TSH, T4TOTAL, T3FREE, THYROIDAB in the last 72 hours.  Invalid input(s): FREET3 ------------------------------------------------------------------------------------------------------------------ No results for input(s): VITAMINB12, FOLATE, FERRITIN, TIBC, IRON, RETICCTPCT in the last 72 hours.  Coagulation profile No results for input(s): INR, PROTIME in the last 168 hours.  No results for input(s): DDIMER in the last 72 hours.  Cardiac Enzymes No results for input(s): CKMB, TROPONINI, MYOGLOBIN in the last 168 hours.  Invalid input(s): CK ------------------------------------------------------------------------------------------------------------------ Invalid input(s): POCBNP    Shonta Phillis D.O. on 03/12/2015 at 3:01 PM  Between 7am to 7pm - Pager -  [phone]  After 7pm go to [email or web address] - password [email or web address]  And look for the night coverage person covering for me after hours  [facility] Office  [phone]

## 2015-03-13 LAB — CBC
HCT: 35.9 % — ABNORMAL LOW (ref 39.0–52.0)
Hemoglobin: 12.1 g/dL — ABNORMAL LOW (ref 13.0–17.0)
MCH: 26.9 pg (ref 26.0–34.0)
MCHC: 33.7 g/dL (ref 30.0–36.0)
MCV: 80 fL (ref 78.0–100.0)
PLATELETS: 187 10*3/uL (ref 150–400)
RBC: 4.49 MIL/uL (ref 4.22–5.81)
RDW: 14.4 % (ref 11.5–15.5)
WBC: 6.6 10*3/uL (ref 4.0–10.5)

## 2015-03-13 LAB — GLUCOSE, CAPILLARY
GLUCOSE-CAPILLARY: 83 mg/dL (ref 65–99)
GLUCOSE-CAPILLARY: 106 mg/dL — AB (ref 65–99)
Glucose-Capillary: 114 mg/dL — ABNORMAL HIGH (ref 65–99)
Glucose-Capillary: 107 mg/dL — ABNORMAL HIGH (ref 65–99)

## 2015-03-13 LAB — APTT: aPTT: 70 seconds — ABNORMAL HIGH (ref 24–37)

## 2015-03-13 LAB — HEPARIN LEVEL (UNFRACTIONATED): Heparin Unfractionated: 1.98 IU/mL — ABNORMAL HIGH (ref 0.30–0.70)

## 2015-03-13 MED ORDER — APIXABAN 5 MG PO TABS
5.0000 mg | ORAL_TABLET | Freq: Two times a day (BID) | ORAL | Status: DC
  Administered 2015-03-13 – 2015-03-19 (×11): 5 mg via ORAL
  Filled 2015-03-13 (×15): qty 1

## 2015-03-13 NOTE — Progress Notes (Signed)
STROKE TEAM PROGRESS NOTE   HISTORY Jerry [name] is an 81 y.o. male with chronic atrial fibrillation on Eliquis, previous stroke, history of traumatic subarachnoid hemorrhage, hypertension, and chronic kidney disease admitted to [facility] 03/11/2015 with increased confusion. An MRI performed today revealed a 3 mm acute white matter infarction of the right posterior frontal lobe at the vertex in the deep white matter as well as a late subacute infarction in the right temporal lobe with some residual internal areas of restricted diffusion.   In March of this year the patient had an MRI that showed an acute large right basal ganglia acute infarct with small bilateral chronic holo hemispheric subdural hematomas.  An MRA showed near complete loss of mid basilar flow related enhancement which could reflect high-grade stenosis and/or artifact.   Currently there are no family members present. The patient is significantly confused and is unable to give any pertinent history.   Date last known well: Unable to determine Time last known well: Unable to determine tPA Given: No unknown time of onset and history of subdural hematomas   SUBJECTIVE (INTERVAL HISTORY) Still confused.    OBJECTIVE Temp:  [97.5 F (36.4 C)-98.2 F (36.8 C)] 98.2 F (36.8 C) (06/18 0700) Pulse Rate:  [48-103] 48 (06/18 0700) Cardiac Rhythm:  [-]  Resp:  [18-19] 18 (06/18 0700) BP: (124-147)/(60-90) 147/79 mmHg (06/18 0700) SpO2:  [94 %-99 %] 94 % (06/18 0700)   Recent Labs Lab 03/12/15 0839 03/12/15 1219 03/12/15 1736 03/12/15 2144 03/13/15 0735  GLUCAP 85 92 100* 98 83    Recent Labs Lab 03/11/15 1905 03/12/15 0328  NA 136 138  K 4.7 4.1  CL 105 107  CO2 22 23  GLUCOSE 106* 95  BUN 15 14  CREATININE 1.46* 1.35*  CALCIUM 8.9 8.5*    Recent Labs Lab 03/11/15 1905 03/12/15 0328  AST 22 18  ALT 15* 13*  ALKPHOS 91 86  BILITOT 0.8 0.8  PROT 6.3* 5.8*  ALBUMIN 3.1* 2.8*    Recent  Labs Lab 03/11/15 1905 03/12/15 0328 03/12/15 0912 03/13/15 0415  WBC 8.9 6.9 7.4 6.6  NEUTROABS 5.3 3.6  --   --   HGB 12.2* 11.4* 11.9* 12.1*  HCT 36.3* 34.0* 35.6* 35.9*  MCV 81.2 81.0 80.9 80.0  PLT 175 172 170 187   No results for input(s): CKTOTAL, CKMB, CKMBINDEX, TROPONINI in the last 168 hours. No results for input(s): LABPROT, INR in the last 72 hours.  Recent Labs  03/11/15 1829  COLORURINE YELLOW  LABSPEC 1.016  PHURINE 5.5  GLUCOSEU NEGATIVE  HGBUR LARGE*  BILIRUBINUR NEGATIVE  KETONESUR NEGATIVE  PROTEINUR NEGATIVE  UROBILINOGEN 0.2  NITRITE NEGATIVE  LEUKOCYTESUR MODERATE*       Component Value Date/Time   CHOL 170 12/12/2014 0616   TRIG 90 12/12/2014 0616   HDL 28* 12/12/2014 0616   CHOLHDL 6.1 12/12/2014 0616   VLDL 18 12/12/2014 0616   LDLCALC 124* 12/12/2014 0616   Lab Results  Component Value Date   HGBA1C 4.9 12/12/2014      Component Value Date/Time   LABOPIA NONE DETECTED 03/12/2015 0527   COCAINSCRNUR NONE DETECTED 03/12/2015 0527   LABBENZ NONE DETECTED 03/12/2015 0527   AMPHETMU NONE DETECTED 03/12/2015 0527   THCU NONE DETECTED 03/12/2015 0527   LABBARB NONE DETECTED 03/12/2015 0527    No results for input(s): ETH in the last 168 hours.  Dg Chest 1 View 03/11/2015    No acute abnormality noted.  Ct Head Wo Contrast 03/11/2015    Chronic atrophic and ischemic changes as well as findings of prior infarcts. These changes are stable from the prior study.      Ct Cervical Spine Wo Contrast 03/11/2015    Healing left second rib fracture.  Left clavicular head fracture without significant displacement. No other focal acute bony abnormality is noted.  Multilevel degenerative change .  Multilevel degenerative change without acute abnormality.       Mr Brain Wo Contrast 03/12/2015    3 mm acute white matter infarction of the right posterior frontal lobe at the vertex in the deep white matter.  Late subacute infarction in the  right temporal lobe with some residual internal areas of restricted diffusion.  Extensive atrophic and ischemic changes of a chronic nature elsewhere throughout the brain. 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 Thin subdural hygromas without mass effect.       CTA of the Head and Neck 12/13/2014 No proximal anterior circulation stenosis or large vessel occlusion. Estimated 50% stenosis RIGHT V4 segment vertebral (non dominant vessel),  Estimated 50-75% stenosis of the mid basilar artery.   PHYSICAL EXAM  GENERAL: Agitated and restless   HEENT: normal   ABDOMEN: soft  EXTREMITIES: No edema   BACK: normal  SKIN: Normal by inspection.    MENTAL STATUS: Alert but confused w non sensical speech   CRANIAL NERVES: Pupils are equal, round and reactive to light; extra ocular movements are full, there is no significant nystagmus; visual fields are full but direct threat; upper and lower facial muscles are normal in strength and symmetric, there is no flattening of the nasolabial folds.  MOTOR: move all 4 well  COORDINATION: Marked rest tremor and intention tremor and jerky movement; marked rigidity and cogwheeling.   SENSATION: Normal to light pain        ASSESSMENT/PLAN [name] is a 81 y.o. male with history of chronic atrial fibrillation on Eliquis, previous stroke, history of subarachnoid hemorrhage, hypertension, and chronic kidney disease presenting with confusion in the setting of a UTI.  He did not receive IV t-PA due to unknown time of onset and history of subdural hematomas.  Stroke:  Non-dominant infarct probably embolic secondary to atrial fibrillation.  Resultant  confusion  MRI  3 mm acute white matter infarction of the right posterior frontal lobe at the vertex in the deep white matter.  MRA not performed  Carotid Doppler  not performed - (CTA of Neck 12/13/2014)  CTA of the head and neck on 12/13/2014 - see above  2D  Echo - 12/14/2014 - EF 45-50% - no cardiac source of emboli identified.  LDL  - check in a.m.   HgbA1c 12/12/2014 - 4.9  Eliquis for VTE prophylaxis  DIET DYS 3 Room service appropriate?: Yes with Assist; Fluid consistency:: Thin  eliquis (apixaban) prior to admission, now on eliquis (apixaban)  Ongoing aggressive stroke risk factor management  Therapy recommendations:  SNF recommended  Disposition:  Pending  Hypertension  Home meds: Norvasc 10 mg daily and Lopressor 25 mg daily   Stable   Hyperlipidemia  Home meds: Lipitor 40 mg daily resumed in hospital  LDL pending, goal < 70  Continue statin at discharge    Other Stroke Risk Factors  Advanced age  ETOH use  Hx stroke/TIA   Other Active Problems  UTI - day #2 IV Rocephin  Renal insufficiency  Mild anemia  Appears parkinsonian.  Will try sinemet 25/100  bid x 3 day then tid.   Other Pertinent History    Hospital day # 2  Dr Gerilyn Pilgrim - The patient was seen and examined by me; notes, chart and tests reviewed and discussed with midlevel provider, other providers, patient, and family.       To contact Stroke Continuity provider, please refer to [email or web address]. After hours, contact General Neurology

## 2015-03-13 NOTE — Progress Notes (Signed)
ANTICOAGULATION CONSULT NOTE - Initial Consult  Pharmacy Consult for Apixaban Indication: atrial fibrillation  Allergies  Allergen Reactions  . Hydrocodone Other (See Comments)    Reaction unknown  . Penicillins Other (See Comments)    REACTION: unknown  . Tetanus Toxoids Other (See Comments)    unknown    Patient Measurements: Height: 5\' 9"  (175.3 cm) Weight: 143 lb 8.3 oz (65.1 kg) IBW/kg (Calculated) : 70.7 Heparin Dosing Weight:   Vital Signs: Temp: 98.2 F (36.8 C) (06/18 0700) Temp Source: Oral (06/18 0700) BP: 147/79 mmHg (06/18 0700) Pulse Rate: 48 (06/18 0700)  Labs:  Recent Labs  03/11/15 1905 03/12/15 0328 03/12/15 0912 03/12/15 1529 03/13/15 0415  HGB 12.2* 11.4* 11.9*  --  12.1*  HCT 36.3* 34.0* 35.6*  --  35.9*  PLT 175 172 170  --  187  APTT  --   --  72* 90* 70*  HEPARINUNFRC  --   --  >2.20*  --  1.98*  CREATININE 1.46* 1.35*  --   --   --     Estimated Creatinine Clearance: 34.8 mL/min (by C-G formula based on Cr of 1.35).   Medical History: Past Medical History  Diagnosis Date  . Hypertension   . Arthritis   . Hernia   . Dysrhythmia     atrial fibrillation  . Head injury   . Humerus fracture     right  . Asthma     as a child  . Pneumonia   . Stroke     "light stroke"  . H/O hiatal hernia   . Stroke 2016  . A-fib   . Back pain     Medications:  Scheduled:  . amLODipine  10 mg Oral Daily  . atorvastatin  40 mg Oral q1800  . cefTRIAXone (ROCEPHIN)  IV  1 g Intravenous Q24H  . metoprolol  25 mg Oral Daily    Assessment: 81yo male who has been on Heparin bridge, now to resume Apixaban.  He passed his swallow evaluation on 6/17.  No bleeding noted.  Hg stable and pltc wnl.  Cr 1.35 on 6/17 and wt > 60kg, no dosage adjustment needed.  Goal of Therapy:  prevention of stroke d/t AFib Monitor platelets by anticoagulation protocol: Yes   Plan:  D/C heparin and all heparin related labs Apixaban 5mg  po bid Watch for s/s of  bleeding  Marisue Humble, PharmD Clinical Pharmacist  [facility]

## 2015-03-13 NOTE — Progress Notes (Signed)
[facility]                                                                              Patient Demographics  Jerry [name], is a 81 y.o. male, DOB - 11-21-1923, GGY:[id number]  Admit date - 03/11/2015   Admitting Physician Eduard Clos, MD  Outpatient Primary MD for the patient is [name], MD  LOS - 2   Chief Complaint  Patient presents with  . Altered Mental Status      HPI on 03/11/2015 by Dr. Midge Minium Jerry [name] is a 81 y.o. male with history of chronic atrial fibrillation on Apixaban, previous stroke, history of traumatic subarachnoid hemorrhage, hypertension, chronic kidney disease was brought to the ER to patient's family found the patient was increasingly confused today. Patient has been recently having frequent falls and was brought to the ER 2 weeks ago after patient sustained injury to his left hand and elbow. Patient had sutures placed on his finger and was discharged back to assisted living to study. Today patient's family found the patient was confused and was brought to the ER. Patient also has been having some jerking spells. Patient did not lose consciousness as per the family. CT of the head and neck shows fractures of the ribs and clavicle. On my exam patient is oriented to his name only. Lab work shows UTI. Patient is being admitted for further management.  Assessment & Plan   Acute encephalopathy/Acute CVA  -Possibly secondary to urinary tract infection -CT head: Chronic atrophic and ischemic changes -MRI brain: 41mm acute white matter infarction -Neurology consulted and appreciated- feels the infarct may have been incidental, and rec continuing Eliquis -Pending repeat Hemoglobin A1c (in March 2016: 4.9), LDL (in March 2016 124) -Currently on heparin - but will change back to Eliquis -Echocardiogram 12/14/2014: EF45-50% -PT/OT consulted- PT recommended SNF -Speech recommended dysphagia 3 diet  Urinary tract infection -Urine  cultures pending -Continue ceftriaxone  Hypertension -Continue metoprolol, amlodipine  Frequent falls -PT consulted -Patient has noted healing left 2nd rib fracture  Chronic atrial fibrillation -On Eliquis at home, however, held and patient placed on heparin -Would consider stopping anticoagulation given age and fall risk, will speak with family  Hyperlipidemia -Continue statin -pending lipid panel  Chronic kidney disease -Appears to be at baseline, continue to monitor BMP  History of traumatic subarachnoid hemorrhage  Code Status: Full  Family Communication: None at bedside  Disposition Plan: Admitted. Pending further recommendations from neurology.  Discharge likely within 24-48hrs.  Time Spent in minutes   30 minutes  Procedures  None  Consults   Neurology   DVT Prophylaxis  Heparin  Lab Results  Component Value Date   PLT 187 03/13/2015    Medications  Scheduled Meds: . amLODipine  10 mg Oral Daily  . apixaban  5 mg Oral BID  . atorvastatin  40 mg Oral q1800  . cefTRIAXone (ROCEPHIN)  IV  1 g Intravenous Q24H  . metoprolol  25 mg Oral Daily   Continuous Infusions: . sodium chloride 50 mL/hr at 03/11/15 2318   PRN Meds:.traMADol  Antibiotics    Anti-infectives    Start     Dose/Rate Route  Frequency Ordered Stop   03/12/15 2130  cefTRIAXone (ROCEPHIN) 1 g in dextrose 5 % 50 mL IVPB - Premix     1 g 100 mL/hr over 30 Minutes Intravenous Every 24 hours 03/11/15 2236     03/11/15 2045  cefTRIAXone (ROCEPHIN) 1 g in dextrose 5 % 50 mL IVPB     1 g 100 mL/hr over 30 Minutes Intravenous  Once 03/11/15 2041 03/12/15 0034        Subjective:   Jerry [name] seen and examined today.  Patient states that he feels "twisted."  He denies chest pain, shortness of breath, abdominal pain, headache, dizziness.   Objective:   Filed Vitals:   03/12/15 1321 03/12/15 2100 03/13/15 0200 03/13/15 0700  BP: 124/65 129/60 146/90 147/79  Pulse: 79 89 85 48    Temp: 98.1 F (36.7 C) 97.5 F (36.4 C) 98.2 F (36.8 C) 98.2 F (36.8 C)  TempSrc: Oral Oral Oral Oral  Resp: 19 18 18 18   Height:      Weight:      SpO2: 97% 98% 98% 94%    Wt Readings from Last 3 Encounters:  03/11/15 65.1 kg (143 lb 8.3 oz)  02/08/15 67.586 kg (149 lb)  12/12/14 71.6 kg (157 lb 13.6 oz)     Intake/Output Summary (Last 24 hours) at 03/13/15 1228 Last data filed at 03/13/15 0900  Gross per 24 hour  Intake    180 ml  Output      0 ml  Net    180 ml    Exam  General: Well developed, thin, no distress  HEENT: NCAT, mucous membranes moist.   Cardiovascular: S1 S2 auscultated, irregular  Respiratory: Clear to auscultation   Abdomen: Soft, nontender, nondistended, + bowel sounds  Extremities: warm dry without cyanosis clubbing or edema  Neuro: AAO to self.  Nonfocal, able to follow commands  Skin: Multiple bruises and scabs  Data Review   Micro Results No results found for this or any previous visit (from the past 240 hour(s)).  Radiology Reports Dg Chest 1 View  03/11/2015   CLINICAL DATA:  Altered mental status and chest pain  EXAM: CHEST  1 VIEW  COMPARISON:  03/04/2015  FINDINGS: Cardiac shadow is mildly enlarged. The known bilateral rib fractures are not well appreciated on this exam. The lungs are well-aerated without focal infiltrate or sizable effusion. Mild interstitial changes are again seen. Postsurgical changes in the right humerus are noted.  IMPRESSION: No acute abnormality noted.   Electronically Signed   By: Alcide Clever M.D.   On: 03/11/2015 20:01   Dg Chest 2 View  02/14/2015   CLINICAL DATA:  Patient with injury to the finger. No chest complaints.  EXAM: CHEST  2 VIEW  COMPARISON:  Chest radiograph 12/11/2014  FINDINGS: Stable enlarged cardiac and mediastinal contours. No consolidative pulmonary opacities. Calcified granuloma left upper lung. Mid thoracic spine degenerative changes. Plate and screw hardware proximal right  humerus.  IMPRESSION: No acute cardiopulmonary process.   Electronically Signed   By: Annia Belt M.D.   On: 02/14/2015 21:05   Dg Wrist Complete Right  02/14/2015   CLINICAL DATA:  Right wrist pain.  EXAM: RIGHT WRIST - COMPLETE 3+ VIEW  COMPARISON:  None.  FINDINGS: No fracture or dislocation. There is degenerative change involving the scapholunate articulation. No definite erosions. No evidence of chondrocalcinosis. Adjacent vascular calcifications. No definite displacement of the pronator quadratus fat pad. Regional soft tissues appear normal. There is a  punctate (approximately 3 mm) ossicle within the soft tissues of the imaged mid aspect of the palm. No associated subcutaneous emphysema. No radiopaque foreign body.  IMPRESSION: 1. No acute findings. 2. Degenerative change involving the scapholunate articulation, presumably degenerative in etiology though could be seen in the setting of an inflammatory arthropathy. Clinical correlation is advised. 3. Punctate (approximately 3 mm) ossicle within the subcutaneous tissues of the palm without associated subcutaneous emphysema. Clinical correlation is advised.   Electronically Signed   By: Simonne Come M.D.   On: 02/14/2015 21:09   Ct Head Wo Contrast  03/11/2015   CLINICAL DATA:  Multiple falls in last few weeks, initial encounter  EXAM: CT HEAD WITHOUT CONTRAST  CT CERVICAL SPINE WITHOUT CONTRAST  TECHNIQUE: Multidetector CT imaging of the head and cervical spine was performed following the standard protocol without intravenous contrast. Multiplanar CT image reconstructions of the cervical spine were also generated.  COMPARISON:  02/14/2015  FINDINGS: CT HEAD FINDINGS  The bony calvarium is intact. Diffuse atrophic and chronic white matter ischemic changes are seen. There are changes of prior infarcts identified within the right temporal lobe and extending superiorly into the right basal ganglia and centrum semi ovale. No findings to suggest acute hemorrhage,  acute infarction or space-occupying mass lesion are noted.  CT CERVICAL SPINE FINDINGS  The examination is somewhat limited by patient motion artifact. Multilevel facet hypertrophic changes are seen. Vertebral body height is well maintained. Osteophytic changes are noted worst at the C5-6 and C6-7 level. Changes consistent with healing fracture of the left second rib posteriorly are seen. The visualized lung apices are within normal limits. The surrounding soft tissues show carotid calcifications without acute abnormality. Seen only on the sagittal reconstructions there is an undisplaced fracture of the left clavicular head.  IMPRESSION: CT of the head: Chronic atrophic and ischemic changes as well as findings of prior infarcts. These changes are stable from the prior study.  CT of the cervical spine:  Healing left second rib fracture.  Left clavicular head fracture without significant displacement. No other focal acute bony abnormality is noted.  Multilevel degenerative change .  Multilevel degenerative change without acute abnormality.   Electronically Signed   By: Alcide Clever M.D.   On: 03/11/2015 20:17   Ct Head Wo Contrast  02/14/2015   CLINICAL DATA:  Post fall hitting back of head. Patient on blood thinners.  EXAM: CT HEAD WITHOUT CONTRAST  CT CERVICAL SPINE WITHOUT CONTRAST  TECHNIQUE: Multidetector CT imaging of the head and cervical spine was performed following the standard protocol without intravenous contrast. Multiplanar CT image reconstructions of the cervical spine were also generated.  COMPARISON:  12/18/2014  FINDINGS: CT HEAD FINDINGS  Regional soft tissues appear normal with special attention paid to the posterior aspect of the calvarium.  Similar findings of advanced atrophy with sulcal prominence and centralized volume loss with commensurate ex vacuo dilatation of the ventricular system. Stable sequela of prior infarction involving the anterior aspect of the right temporal lobe (image 10,  series 21 as well as about the anterior horn of the right lateral ventricle (image 16, series 21) with unchanged ex vacuo dilatation of the right lateral ventricle. There is unchanged prominence of the bifrontal extra-axial spaces. Unchanged scattered periventricular hypodensities compatible microvascular ischemic disease. Given extensive background parenchymal abnormalities, there is no CT evidence of superimposed acute large territory infarct. No intraparenchymal or extra-axial mass or hemorrhage. Unchanged size a configuration of the ventricles and basilar cisterns. No midline  shift. Intracranial atherosclerosis.  Limited visualization of the paranasal sinuses and mastoid air cells is normal. No air-fluid levels. There is persistent leftward deviation of the nasal bone, likely the sequela of remote fracture. Post bilateral cataract surgery. No displaced calvarial fracture.  CT CERVICAL SPINE FINDINGS  C1 to the superior endplate of T1 is imaged.  Normal alignment of the cervical spine. No anterolisthesis or retrolisthesis. The bilateral facets are normally aligned. The dens is normally positioned between the lateral masses of C1. Moderate atlantodental degenerative change. Normal atlanto axial articulations.  No fracture or static subluxation of the cervical spine. Cervical vertebral body heights are preserved. Prevertebral soft tissues are normal.  Moderate multilevel cervical spine DDD, worse at C5-C6 and C6-C7 with disc space height loss, endplate irregularity and sclerosis.  There is partial ossification of the nuchal ligament posterior to the C5 and C6 spinous processes.  No bulky cervical lymphadenopathy on this noncontrast examination. Scattered atherosclerotic plaque within the bilateral carotid bulbs.  Limited visualization lung apices is normal.  IMPRESSION: 1. No acute intracranial process. 2. Sequela of advanced atrophy, microvascular ischemic disease and prior infarcts involving the right temporal  lobe and right centrum semi ovale. 3. No fracture or static subluxation of the cervical spine. 4. Moderate multilevel cervical spine DDD, worse at C5-C6 and C6-C7.   Electronically Signed   By: Simonne Come M.D.   On: 02/14/2015 21:18   Ct Chest Wo Contrast  03/04/2015   CLINICAL DATA:  History of multiple falls, evaluate for rib lesion, former smoking history  EXAM: CT CHEST WITHOUT CONTRAST  TECHNIQUE: Multidetector CT imaging of the chest was performed following the standard protocol without IV contrast.  COMPARISON:  Chest x-ray of 02/14/2015  FINDINGS: Calcified granulomas are present within the left upper lobe consistent with prior granulomatous disease. Also there is some calcification within the spleen consistent with a benign process. No active infiltrate is seen. No pleural effusion is noted. The central airway is patent.  On bone window images, there are nondisplaced fractures of the anterior right second, third and fifth ribs with some callus formation present. On the left, there are fractures the anterior left third, fourth, fifth, and sixth ribs with some callus formation. There is a thoracic kyphosis present in the bones appear diffusely osteopenic. Very minimal compression of the superior endplate of T8 is present. The sternum appears intact.  On soft tissue window images, the thyroid gland is unremarkable. Atheromatous change is noted involving the aortic arch and descending thoracic aorta. No mediastinal or hilar adenopathy is seen. Coronary artery calcifications are present primarily within the left anterior descending artery and cardiomegaly is noted. There does appear to be a small hiatal hernia present.  IMPRESSION: 1. Subacute fractures of the anterior right second, third, and fifth ribs with subacute fractures of the anterior left third, fourth, fifth, and sixth ribs with some callus formation present. Diffuse osteopenia. 2. Changes of prior granulomatous these disease with granulomas in the  left upper lobe. 3. Thoracic kyphosis. 4. Calcification of the left anterior descending coronary artery. 5. Small hiatal hernia.   Electronically Signed   By: Dwyane Dee M.D.   On: 03/04/2015 11:44   Ct Cervical Spine Wo Contrast  03/11/2015   CLINICAL DATA:  Multiple falls in last few weeks, initial encounter  EXAM: CT HEAD WITHOUT CONTRAST  CT CERVICAL SPINE WITHOUT CONTRAST  TECHNIQUE: Multidetector CT imaging of the head and cervical spine was performed following the standard protocol without intravenous contrast. Multiplanar  CT image reconstructions of the cervical spine were also generated.  COMPARISON:  02/14/2015  FINDINGS: CT HEAD FINDINGS  The bony calvarium is intact. Diffuse atrophic and chronic white matter ischemic changes are seen. There are changes of prior infarcts identified within the right temporal lobe and extending superiorly into the right basal ganglia and centrum semi ovale. No findings to suggest acute hemorrhage, acute infarction or space-occupying mass lesion are noted.  CT CERVICAL SPINE FINDINGS  The examination is somewhat limited by patient motion artifact. Multilevel facet hypertrophic changes are seen. Vertebral body height is well maintained. Osteophytic changes are noted worst at the C5-6 and C6-7 level. Changes consistent with healing fracture of the left second rib posteriorly are seen. The visualized lung apices are within normal limits. The surrounding soft tissues show carotid calcifications without acute abnormality. Seen only on the sagittal reconstructions there is an undisplaced fracture of the left clavicular head.  IMPRESSION: CT of the head: Chronic atrophic and ischemic changes as well as findings of prior infarcts. These changes are stable from the prior study.  CT of the cervical spine:  Healing left second rib fracture.  Left clavicular head fracture without significant displacement. No other focal acute bony abnormality is noted.  Multilevel degenerative  change .  Multilevel degenerative change without acute abnormality.   Electronically Signed   By: Alcide Clever M.D.   On: 03/11/2015 20:17   Ct Cervical Spine Wo Contrast  02/14/2015   CLINICAL DATA:  Post fall hitting back of head. Patient on blood thinners.  EXAM: CT HEAD WITHOUT CONTRAST  CT CERVICAL SPINE WITHOUT CONTRAST  TECHNIQUE: Multidetector CT imaging of the head and cervical spine was performed following the standard protocol without intravenous contrast. Multiplanar CT image reconstructions of the cervical spine were also generated.  COMPARISON:  12/18/2014  FINDINGS: CT HEAD FINDINGS  Regional soft tissues appear normal with special attention paid to the posterior aspect of the calvarium.  Similar findings of advanced atrophy with sulcal prominence and centralized volume loss with commensurate ex vacuo dilatation of the ventricular system. Stable sequela of prior infarction involving the anterior aspect of the right temporal lobe (image 10, series 21 as well as about the anterior horn of the right lateral ventricle (image 16, series 21) with unchanged ex vacuo dilatation of the right lateral ventricle. There is unchanged prominence of the bifrontal extra-axial spaces. Unchanged scattered periventricular hypodensities compatible microvascular ischemic disease. Given extensive background parenchymal abnormalities, there is no CT evidence of superimposed acute large territory infarct. No intraparenchymal or extra-axial mass or hemorrhage. Unchanged size a configuration of the ventricles and basilar cisterns. No midline shift. Intracranial atherosclerosis.  Limited visualization of the paranasal sinuses and mastoid air cells is normal. No air-fluid levels. There is persistent leftward deviation of the nasal bone, likely the sequela of remote fracture. Post bilateral cataract surgery. No displaced calvarial fracture.  CT CERVICAL SPINE FINDINGS  C1 to the superior endplate of T1 is imaged.  Normal  alignment of the cervical spine. No anterolisthesis or retrolisthesis. The bilateral facets are normally aligned. The dens is normally positioned between the lateral masses of C1. Moderate atlantodental degenerative change. Normal atlanto axial articulations.  No fracture or static subluxation of the cervical spine. Cervical vertebral body heights are preserved. Prevertebral soft tissues are normal.  Moderate multilevel cervical spine DDD, worse at C5-C6 and C6-C7 with disc space height loss, endplate irregularity and sclerosis.  There is partial ossification of the nuchal ligament posterior to the C5  and C6 spinous processes.  No bulky cervical lymphadenopathy on this noncontrast examination. Scattered atherosclerotic plaque within the bilateral carotid bulbs.  Limited visualization lung apices is normal.  IMPRESSION: 1. No acute intracranial process. 2. Sequela of advanced atrophy, microvascular ischemic disease and prior infarcts involving the right temporal lobe and right centrum semi ovale. 3. No fracture or static subluxation of the cervical spine. 4. Moderate multilevel cervical spine DDD, worse at C5-C6 and C6-C7.   Electronically Signed   By: Simonne Come M.D.   On: 02/14/2015 21:18   Mr Brain Wo Contrast  03/12/2015   CLINICAL DATA:  Acute encephalopathy. Worsening confusion acutely. Recent falls.  EXAM: MRI HEAD WITHOUT CONTRAST  TECHNIQUE: Multiplanar, multiecho pulse sequences of the brain and surrounding structures were obtained without intravenous contrast.  COMPARISON:  Head CT 03/11/2015.  MRI 12/12/2014.  FINDINGS: Diffusion imaging shows a 3 mm acute infarction in the deep white matter of the right posterior frontal region near the vertex. There are minimal chronic small-vessel changes of the brainstem. There are a few old small vessel cerebellar infarctions. There is cerebellar atrophy. Cerebral hemispheres show late subacute infarction in the right basal ganglia. There are a few areas of  restricted diffusion internally in that area, not felt to represent true extensions or new infarctions. No acute cerebral hemispheric infarction is seen. The brain shows advanced generalized atrophy with chronic small-vessel ischemic changes throughout the white matter. There is chronic atrophy and gliosis of the right temporal lobe and in the left frontal lobe. Hemosiderin is seen along the surface of the brain and in the left frontal and right temporal lobe consistent with previous intracranial hemorrhage. No sign of acute hemorrhage. No evidence of neoplastic mass lesion, obstructive hydrocephalus or extra-axial hematoma. There are then subdural hygromas, unchanged and without mass effect. No pituitary mass. No inflammatory sinus disease. There is a small amount of fluid in the mastoid air cells.  IMPRESSION: 3 mm acute white matter infarction of the right posterior frontal lobe at the vertex in the deep white matter.  Late subacute infarction in the right temporal lobe with some residual internal areas of restricted diffusion.  Extensive atrophic and ischemic changes of a chronic nature elsewhere throughout the brain. 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 Thin subdural hygromas without mass effect.   Electronically Signed   By: Paulina Fusi M.D.   On: 03/12/2015 08:38   Dg Hand Complete Left  02/14/2015   CLINICAL DATA:  Fourth digit laceration getting out of bed. Initial encounter.  EXAM: LEFT HAND - COMPLETE 3+ VIEW  COMPARISON:  None.  FINDINGS: No acute fracture or malalignment. When accounting for mineralization at the fourth digit, no opaque foreign body suspected.  Interphalangeal joint narrowing and spurring, expected for age. Ulnar positive variance with abutment of the lunate and distal ulna, developmental versus remote distal radius fracture.  Status post partial amputation of the middle finger.  IMPRESSION: No acute osseous findings.   Electronically Signed   By:  Marnee Spring M.D.   On: 02/14/2015 21:07    CBC  Recent Labs Lab 03/11/15 1905 03/12/15 0328 03/12/15 0912 03/13/15 0415  WBC 8.9 6.9 7.4 6.6  HGB 12.2* 11.4* 11.9* 12.1*  HCT 36.3* 34.0* 35.6* 35.9*  PLT 175 172 170 187  MCV 81.2 81.0 80.9 80.0  MCH 27.3 27.1 27.0 26.9  MCHC 33.6 33.5 33.4 33.7  RDW 14.5 14.5 14.5 14.4  LYMPHSABS 1.7 1.9  --   --  MONOABS 1.7* 1.2*  --   --   EOSABS 0.2 0.1  --   --   BASOSABS 0.1 0.1  --   --     Chemistries   Recent Labs Lab 03/11/15 1905 03/12/15 0328  NA 136 138  K 4.7 4.1  CL 105 107  CO2 22 23  GLUCOSE 106* 95  BUN 15 14  CREATININE 1.46* 1.35*  CALCIUM 8.9 8.5*  AST 22 18  ALT 15* 13*  ALKPHOS 91 86  BILITOT 0.8 0.8   ------------------------------------------------------------------------------------------------------------------ estimated creatinine clearance is 34.8 mL/min (by C-G formula based on Cr of 1.35). ------------------------------------------------------------------------------------------------------------------ No results for input(s): HGBA1C in the last 72 hours. ------------------------------------------------------------------------------------------------------------------ No results for input(s): CHOL, HDL, LDLCALC, TRIG, CHOLHDL, LDLDIRECT in the last 72 hours. ------------------------------------------------------------------------------------------------------------------ No results for input(s): TSH, T4TOTAL, T3FREE, THYROIDAB in the last 72 hours.  Invalid input(s): FREET3 ------------------------------------------------------------------------------------------------------------------ No results for input(s): VITAMINB12, FOLATE, FERRITIN, TIBC, IRON, RETICCTPCT in the last 72 hours.  Coagulation profile No results for input(s): INR, PROTIME in the last 168 hours.  No results for input(s): DDIMER in the last 72 hours.  Cardiac Enzymes No results for input(s): CKMB, TROPONINI,  MYOGLOBIN in the last 168 hours.  Invalid input(s): CK ------------------------------------------------------------------------------------------------------------------ Invalid input(s): POCBNP    Jaeden Westbay D.O. on 03/13/2015 at 12:28 PM  Between 7am to 7pm - Pager - [phone]  After 7pm go to [email or web address] - password [email or web address]  And look for the night coverage person covering for me after hours  [facility] Office  [phone]

## 2015-03-13 NOTE — Clinical Social Work Note (Signed)
Clinical Social Work Assessment  Patient Details  Name: Jerry [name] MRN: [id number] Date of Birth: 1923-12-15  Date of referral:  03/13/15               Reason for consult:  Facility Placement, Discharge Planning                Permission sought to share information with:  Facility Medical sales representative, Family Supports Permission granted to share information::  Yes, Verbal Permission Granted  Name::     Drey Goodman   Agency::  [facility]   Relationship::  Son/family   Contact Information:  [phone]  Housing/Transportation Living arrangements for the past 2 months:  Assisted Living Facility Source of Information:  Adult Children Patient Interpreter Needed:  None Criminal Activity/Legal Involvement Pertinent to Current Situation/Hospitalization:  No - Comment as needed Significant Relationships:  Adult Children, Community Support Lives with:  Facility Resident Do you feel safe going back to the place where you live?  Yes Need for family participation in patient care:  Yes (Comment) (Communication is to be wth Delena Serve and his wife.)  Care giving concerns:  Pt's family  Have no care giving concerns. Pt's family is happy with Pt's current placement and would like Pt to return at d/c with HHPT coming to the facility.   Social Worker assessment / plan: Pt unable to participate in assessment due to Pt's present orientation. CSW contacted Pt's son Moeez Yamamoto via phone for discharge placement. Pt's son stated that Pt has been at Pt's current placement (ALF) for "a long time" and the family is happy with their services. Pt's son stated that Pt's "last PT needs were able to be accomplished at Pt's facility. Pt's son stated he would contact the facility to assess if the facility would be able to accept Pt with Pt's current medical needs and rehab needs. Pt's son stated that [facility] is in approval for   Employment status:  Retired Chief Financial Officer:  Teacher, English as a foreign language, Medicaid In [place] PT Recommendations:  Skilled Nursing Facility Information / Referral to community resources:  Other (Comment Required) [facility] [employer])  Patient/Family's Response to care: Pt's son and daughter in law were pleased with assistance transitioning Pt back to facility.  Patient/Family's Understanding of and Emotional Response to Diagnosis, Current Treatment, and Prognosis:  Pt is aware of Pt's current medical concerns and the need for PT at the time of discharge. Pt's son does have concerns about his recovery, however Pt's son is fully confident in Pt's facility continuing care for the Pt.      Emotional Assessment Appearance:  Disheveled, Appears older than stated age Attitude/Demeanor/Rapport:  Aggressive (Verbally and/or physically), Irrational, Uncooperative, Combative Affect (typically observed):  Anxious, Restless Orientation:  Oriented to Self, Fluctuating Orientation (Suspected and/or reported Sundowners) Alcohol / Substance use:  Not Applicable Psych involvement (Current and /or in the community):  No (Comment)  Discharge Needs  Concerns to be addressed:  Discharge Planning Concerns, Cognitive Concerns Readmission within the last 30 days:  No Current discharge risk:  Chronically ill, Cognitively Impaired Barriers to Discharge:  No Barriers Identified   Leron Croak 03/13/2015, 6:25 PM

## 2015-03-14 DIAGNOSIS — I6340 Cerebral infarction due to embolism of unspecified cerebral artery: Secondary | ICD-10-CM | POA: Insufficient documentation

## 2015-03-14 DIAGNOSIS — N390 Urinary tract infection, site not specified: Secondary | ICD-10-CM | POA: Insufficient documentation

## 2015-03-14 DIAGNOSIS — R319 Hematuria, unspecified: Secondary | ICD-10-CM

## 2015-03-14 LAB — LIPID PANEL
CHOL/HDL RATIO: 3.1 ratio
Cholesterol: 100 mg/dL (ref 0–200)
HDL: 32 mg/dL — AB (ref >40)
LDL CALC: 52 mg/dL (ref 0–99)
TRIGLYCERIDES: 78 mg/dL (ref <150)
VLDL: 16 mg/dL (ref 0–40)

## 2015-03-14 LAB — BASIC METABOLIC PANEL
Anion gap: 11 (ref 5–15)
BUN: 10 mg/dL (ref 6–20)
CHLORIDE: 109 mmol/L (ref 101–111)
CO2: 20 mmol/L — ABNORMAL LOW (ref 22–32)
Calcium: 8.7 mg/dL — ABNORMAL LOW (ref 8.9–10.3)
Creatinine, Ser: 1.2 mg/dL (ref 0.61–1.24)
GFR, EST NON AFRICAN AMERICAN: 52 mL/min — AB (ref >60)
Glucose, Bld: 95 mg/dL (ref 65–99)
Potassium: 4.1 mmol/L (ref 3.5–5.1)
SODIUM: 140 mmol/L (ref 135–145)

## 2015-03-14 LAB — CBC
HCT: 36.7 % — ABNORMAL LOW (ref 39.0–52.0)
HEMOGLOBIN: 12.4 g/dL — AB (ref 13.0–17.0)
MCH: 27.2 pg (ref 26.0–34.0)
MCHC: 33.8 g/dL (ref 30.0–36.0)
MCV: 80.5 fL (ref 78.0–100.0)
Platelets: 189 10*3/uL (ref 150–400)
RBC: 4.56 MIL/uL (ref 4.22–5.81)
RDW: 14.6 % (ref 11.5–15.5)
WBC: 8.5 10*3/uL (ref 4.0–10.5)

## 2015-03-14 LAB — GLUCOSE, CAPILLARY
Glucose-Capillary: 92 mg/dL (ref 65–99)
Glucose-Capillary: 102 mg/dL — ABNORMAL HIGH (ref 65–99)
Glucose-Capillary: 102 mg/dL — ABNORMAL HIGH (ref 65–99)
Glucose-Capillary: 90 mg/dL (ref 65–99)

## 2015-03-14 MED ORDER — CARBIDOPA-LEVODOPA 25-100 MG PO TABS
1.0000 | ORAL_TABLET | Freq: Two times a day (BID) | ORAL | Status: DC
  Administered 2015-03-14: 1 via ORAL
  Filled 2015-03-14: qty 1

## 2015-03-14 MED ORDER — CARBIDOPA-LEVODOPA 25-100 MG PO TABS: 1.0000 | ORAL_TABLET | Freq: Two times a day (BID) | ORAL | Status: DC

## 2015-03-14 MED ORDER — CARBIDOPA-LEVODOPA 25-100 MG PO TABS: 1.0000 | ORAL_TABLET | Freq: Three times a day (TID) | ORAL | Status: DC

## 2015-03-14 NOTE — Progress Notes (Signed)
[facility]                                                                              Patient Demographics  Jerry [name], is a 81 y.o. male, DOB - 11-06-1923, ZOX:[id number]  Admit date - 03/11/2015   Admitting Physician Jerry Clos, MD  Outpatient Primary MD for the patient is Jerry Mask, MD  LOS - 3   Chief Complaint  Patient presents with  . Altered Mental Status      HPI on 03/11/2015 by Dr. Midge [name] Jerry [name] is a 81 y.o. male with history of chronic atrial fibrillation on Apixaban, previous stroke, history of traumatic subarachnoid hemorrhage, hypertension, chronic kidney disease was brought to the ER to patient's family found the patient was increasingly confused today. Patient has been recently having frequent falls and was brought to the ER 2 weeks ago after patient sustained injury to his left hand and elbow. Patient had sutures placed on his finger and was discharged back to assisted living to study. Today patient's family found the patient was confused and was brought to the ER. Patient also has been having some jerking spells. Patient did not lose consciousness as per the family. CT of the head and neck shows fractures of the ribs and clavicle. On my exam patient is oriented to his name only. Lab work shows UTI. Patient is being admitted for further management.  Assessment & Plan   Acute encephalopathy/Acute CVA  -Possibly secondary to urinary tract infection vs CVA -CT head: Chronic atrophic and ischemic changes -MRI brain: 3mm acute white matter infarction -Neurology consulted and appreciated- feels the infarct may have been incidental, and rec continuing Eliquis -Pending repeat Hemoglobin A1c (in March 2016: 4.9) -LDL 52 -Continue eliquis -Echocardiogram 12/14/2014: EF45-50% -PT/OT consulted- PT recommended SNF -Speech recommended dysphagia 3 diet  Urinary tract infection -Urine cultures pending -Continue  ceftriaxone  Hypertension -Continue metoprolol, amlodipine  Frequent falls -PT consulted -Patient has noted healing left 2nd rib fracture  Chronic atrial fibrillation -Continue eliqius -Would consider stopping anticoagulation given age and fall risk, discussed with daughter, however, she felt she could not make a decision and would like to continue Eliquis at this time  Hyperlipidemia -Continue statin -pending lipid panel  Chronic kidney disease -Appears to be at baseline, continue to monitor BMP  History of traumatic subarachnoid hemorrhage  Code Status: Full  Family Communication: None at bedside. Spoke with daughter, who stated that son would make the decision regarding SNF.  Disposition Plan: Admitted. Patient continues to have confusion.   Time Spent in minutes   30 minutes  Procedures  None  Consults   Neurology   DVT Prophylaxis  Eliquis  Lab Results  Component Value Date   PLT 189 03/14/2015    Medications  Scheduled Meds: . amLODipine  10 mg Oral Daily  . apixaban  5 mg Oral BID  . atorvastatin  40 mg Oral q1800  . carbidopa-levodopa  1 tablet Oral BID   Followed by  . [START ON 03/17/2015] carbidopa-levodopa  1 tablet Oral TID  . cefTRIAXone (ROCEPHIN)  IV  1 g Intravenous Q24H  . metoprolol  25 mg Oral Daily  Continuous Infusions: . sodium chloride 50 mL/hr at 03/14/15 0155   PRN Meds:.traMADol  Antibiotics    Anti-infectives    Start     Dose/Rate Route Frequency Ordered Stop   03/12/15 2130  cefTRIAXone (ROCEPHIN) 1 g in dextrose 5 % 50 mL IVPB - Premix     1 g 100 mL/hr over 30 Minutes Intravenous Every 24 hours 03/11/15 2236     03/11/15 2045  cefTRIAXone (ROCEPHIN) 1 g in dextrose 5 % 50 mL IVPB     1 g 100 mL/hr over 30 Minutes Intravenous  Once 03/11/15 2041 03/12/15 0034        Subjective:   Jerry [name] seen and examined today.  Patient states that he feels better.  He denies chest pain, shortness of breath, abdominal  pain, headache, dizziness.    Objective:   Filed Vitals:   03/13/15 2052 03/14/15 0234 03/14/15 0520 03/14/15 0900  BP: 143/73 137/84 143/86 128/67  Pulse: 104 67 104 80  Temp: 97.5 F (36.4 C) 98.8 F (37.1 C) 97.5 F (36.4 C) 98.2 F (36.8 C)  TempSrc: Oral Oral Oral Axillary  Resp: Height:      Weight:      SpO2: 99% 95% 99% 95%    Wt Readings from Last 3 Encounters:  03/11/15 65.1 kg (143 lb 8.3 oz)  02/08/15 67.586 kg (149 lb)  12/12/14 71.6 kg (157 lb 13.6 oz)     Intake/Output Summary (Last 24 hours) at 03/14/15 1136 Last data filed at 03/13/15 1844  Gross per 24 hour  Intake    180 ml  Output    300 ml  Net   -120 ml    Exam  General: Well developed, thin, no distress  HEENT: NCAT, mucous membranes moist.   Cardiovascular: S1 S2 auscultated, irregular  Respiratory: Clear to auscultation   Abdomen: Soft, nontender, nondistended, + bowel sounds  Extremities: warm dry without cyanosis clubbing or edema  Neuro: AAO to self. Confused. Nonfocal, able to follow commands  Skin: Multiple bruises and scabs  Data Review   Micro Results Recent Results (from the past 240 hour(s))  Urine culture     Status: None (Preliminary result)   Collection Time: 03/11/15  6:22 PM  Result Value Ref Range Status   Specimen Description URINE, CATHETERIZED  Final   Special Requests Immunocompromised  Final   Culture CULTURE REINCUBATED FOR BETTER GROWTH  Final   Report Status PENDING  Incomplete    Radiology Reports Dg Chest 1 View  03/11/2015   CLINICAL DATA:  Altered mental status and chest pain  EXAM: CHEST  1 VIEW  COMPARISON:  03/04/2015  FINDINGS: Cardiac shadow is mildly enlarged. The known bilateral rib fractures are not well appreciated on this exam. The lungs are well-aerated without focal infiltrate or sizable effusion. Mild interstitial changes are again seen. Postsurgical changes in the right humerus are noted.  IMPRESSION: No acute abnormality  noted.   Electronically Signed   By: Alcide Clever M.D.   On: 03/11/2015 20:01   Dg Chest 2 View  02/14/2015   CLINICAL DATA:  Patient with injury to the finger. No chest complaints.  EXAM: CHEST  2 VIEW  COMPARISON:  Chest radiograph 12/11/2014  FINDINGS: Stable enlarged cardiac and mediastinal contours. No consolidative pulmonary opacities. Calcified granuloma left upper lung. Mid thoracic spine degenerative changes. Plate and screw hardware proximal right humerus.  IMPRESSION: No acute cardiopulmonary process.   Electronically Signed  By: Annia Belt M.D.   On: 02/14/2015 21:05   Dg Wrist Complete Right  02/14/2015   CLINICAL DATA:  Right wrist pain.  EXAM: RIGHT WRIST - COMPLETE 3+ VIEW  COMPARISON:  None.  FINDINGS: No fracture or dislocation. There is degenerative change involving the scapholunate articulation. No definite erosions. No evidence of chondrocalcinosis. Adjacent vascular calcifications. No definite displacement of the pronator quadratus fat pad. Regional soft tissues appear normal. There is a punctate (approximately 3 mm) ossicle within the soft tissues of the imaged mid aspect of the palm. No associated subcutaneous emphysema. No radiopaque foreign body.  IMPRESSION: 1. No acute findings. 2. Degenerative change involving the scapholunate articulation, presumably degenerative in etiology though could be seen in the setting of an inflammatory arthropathy. Clinical correlation is advised. 3. Punctate (approximately 3 mm) ossicle within the subcutaneous tissues of the palm without associated subcutaneous emphysema. Clinical correlation is advised.   Electronically Signed   By: Simonne Come M.D.   On: 02/14/2015 21:09   Ct Head Wo Contrast  03/11/2015   CLINICAL DATA:  Multiple falls in last few weeks, initial encounter  EXAM: CT HEAD WITHOUT CONTRAST  CT CERVICAL SPINE WITHOUT CONTRAST  TECHNIQUE: Multidetector CT imaging of the head and cervical spine was performed following the standard  protocol without intravenous contrast. Multiplanar CT image reconstructions of the cervical spine were also generated.  COMPARISON:  02/14/2015  FINDINGS: CT HEAD FINDINGS  The bony calvarium is intact. Diffuse atrophic and chronic white matter ischemic changes are seen. There are changes of prior infarcts identified within the right temporal lobe and extending superiorly into the right basal ganglia and centrum semi ovale. No findings to suggest acute hemorrhage, acute infarction or space-occupying mass lesion are noted.  CT CERVICAL SPINE FINDINGS  The examination is somewhat limited by patient motion artifact. Multilevel facet hypertrophic changes are seen. Vertebral body height is well maintained. Osteophytic changes are noted worst at the C5-6 and C6-7 level. Changes consistent with healing fracture of the left second rib posteriorly are seen. The visualized lung apices are within normal limits. The surrounding soft tissues show carotid calcifications without acute abnormality. Seen only on the sagittal reconstructions there is an undisplaced fracture of the left clavicular head.  IMPRESSION: CT of the head: Chronic atrophic and ischemic changes as well as findings of prior infarcts. These changes are stable from the prior study.  CT of the cervical spine:  Healing left second rib fracture.  Left clavicular head fracture without significant displacement. No other focal acute bony abnormality is noted.  Multilevel degenerative change .  Multilevel degenerative change without acute abnormality.   Electronically Signed   By: Alcide Clever M.D.   On: 03/11/2015 20:17   Ct Head Wo Contrast  02/14/2015   CLINICAL DATA:  Post fall hitting back of head. Patient on blood thinners.  EXAM: CT HEAD WITHOUT CONTRAST  CT CERVICAL SPINE WITHOUT CONTRAST  TECHNIQUE: Multidetector CT imaging of the head and cervical spine was performed following the standard protocol without intravenous contrast. Multiplanar CT image  reconstructions of the cervical spine were also generated.  COMPARISON:  12/18/2014  FINDINGS: CT HEAD FINDINGS  Regional soft tissues appear normal with special attention paid to the posterior aspect of the calvarium.  Similar findings of advanced atrophy with sulcal prominence and centralized volume loss with commensurate ex vacuo dilatation of the ventricular system. Stable sequela of prior infarction involving the anterior aspect of the right temporal lobe (image 10, series  21 as well as about the anterior horn of the right lateral ventricle (image 16, series 21) with unchanged ex vacuo dilatation of the right lateral ventricle. There is unchanged prominence of the bifrontal extra-axial spaces. Unchanged scattered periventricular hypodensities compatible microvascular ischemic disease. Given extensive background parenchymal abnormalities, there is no CT evidence of superimposed acute large territory infarct. No intraparenchymal or extra-axial mass or hemorrhage. Unchanged size a configuration of the ventricles and basilar cisterns. No midline shift. Intracranial atherosclerosis.  Limited visualization of the paranasal sinuses and mastoid air cells is normal. No air-fluid levels. There is persistent leftward deviation of the nasal bone, likely the sequela of remote fracture. Post bilateral cataract surgery. No displaced calvarial fracture.  CT CERVICAL SPINE FINDINGS  C1 to the superior endplate of T1 is imaged.  Normal alignment of the cervical spine. No anterolisthesis or retrolisthesis. The bilateral facets are normally aligned. The dens is normally positioned between the lateral masses of C1. Moderate atlantodental degenerative change. Normal atlanto axial articulations.  No fracture or static subluxation of the cervical spine. Cervical vertebral body heights are preserved. Prevertebral soft tissues are normal.  Moderate multilevel cervical spine DDD, worse at C5-C6 and C6-C7 with disc space height loss,  endplate irregularity and sclerosis.  There is partial ossification of the nuchal ligament posterior to the C5 and C6 spinous processes.  No bulky cervical lymphadenopathy on this noncontrast examination. Scattered atherosclerotic plaque within the bilateral carotid bulbs.  Limited visualization lung apices is normal.  IMPRESSION: 1. No acute intracranial process. 2. Sequela of advanced atrophy, microvascular ischemic disease and prior infarcts involving the right temporal lobe and right centrum semi ovale. 3. No fracture or static subluxation of the cervical spine. 4. Moderate multilevel cervical spine DDD, worse at C5-C6 and C6-C7.   Electronically Signed   By: Simonne Come M.D.   On: 02/14/2015 21:18   Ct Chest Wo Contrast  03/04/2015   CLINICAL DATA:  History of multiple falls, evaluate for rib lesion, former smoking history  EXAM: CT CHEST WITHOUT CONTRAST  TECHNIQUE: Multidetector CT imaging of the chest was performed following the standard protocol without IV contrast.  COMPARISON:  Chest x-ray of 02/14/2015  FINDINGS: Calcified granulomas are present within the left upper lobe consistent with prior granulomatous disease. Also there is some calcification within the spleen consistent with a benign process. No active infiltrate is seen. No pleural effusion is noted. The central airway is patent.  On bone window images, there are nondisplaced fractures of the anterior right second, third and fifth ribs with some callus formation present. On the left, there are fractures the anterior left third, fourth, fifth, and sixth ribs with some callus formation. There is a thoracic kyphosis present in the bones appear diffusely osteopenic. Very minimal compression of the superior endplate of T8 is present. The sternum appears intact.  On soft tissue window images, the thyroid gland is unremarkable. Atheromatous change is noted involving the aortic arch and descending thoracic aorta. No mediastinal or hilar adenopathy is  seen. Coronary artery calcifications are present primarily within the left anterior descending artery and cardiomegaly is noted. There does appear to be a small hiatal hernia present.  IMPRESSION: 1. Subacute fractures of the anterior right second, third, and fifth ribs with subacute fractures of the anterior left third, fourth, fifth, and sixth ribs with some callus formation present. Diffuse osteopenia. 2. Changes of prior granulomatous these disease with granulomas in the left upper lobe. 3. Thoracic kyphosis. 4. Calcification of the left  anterior descending coronary artery. 5. Small hiatal hernia.   Electronically Signed   By: Dwyane Dee M.D.   On: 03/04/2015 11:44   Ct Cervical Spine Wo Contrast  03/11/2015   CLINICAL DATA:  Multiple falls in last few weeks, initial encounter  EXAM: CT HEAD WITHOUT CONTRAST  CT CERVICAL SPINE WITHOUT CONTRAST  TECHNIQUE: Multidetector CT imaging of the head and cervical spine was performed following the standard protocol without intravenous contrast. Multiplanar CT image reconstructions of the cervical spine were also generated.  COMPARISON:  02/14/2015  FINDINGS: CT HEAD FINDINGS  The bony calvarium is intact. Diffuse atrophic and chronic white matter ischemic changes are seen. There are changes of prior infarcts identified within the right temporal lobe and extending superiorly into the right basal ganglia and centrum semi ovale. No findings to suggest acute hemorrhage, acute infarction or space-occupying mass lesion are noted.  CT CERVICAL SPINE FINDINGS  The examination is somewhat limited by patient motion artifact. Multilevel facet hypertrophic changes are seen. Vertebral body height is well maintained. Osteophytic changes are noted worst at the C5-6 and C6-7 level. Changes consistent with healing fracture of the left second rib posteriorly are seen. The visualized lung apices are within normal limits. The surrounding soft tissues show carotid calcifications without  acute abnormality. Seen only on the sagittal reconstructions there is an undisplaced fracture of the left clavicular head.  IMPRESSION: CT of the head: Chronic atrophic and ischemic changes as well as findings of prior infarcts. These changes are stable from the prior study.  CT of the cervical spine:  Healing left second rib fracture.  Left clavicular head fracture without significant displacement. No other focal acute bony abnormality is noted.  Multilevel degenerative change .  Multilevel degenerative change without acute abnormality.   Electronically Signed   By: Alcide Clever M.D.   On: 03/11/2015 20:17   Ct Cervical Spine Wo Contrast  02/14/2015   CLINICAL DATA:  Post fall hitting back of head. Patient on blood thinners.  EXAM: CT HEAD WITHOUT CONTRAST  CT CERVICAL SPINE WITHOUT CONTRAST  TECHNIQUE: Multidetector CT imaging of the head and cervical spine was performed following the standard protocol without intravenous contrast. Multiplanar CT image reconstructions of the cervical spine were also generated.  COMPARISON:  12/18/2014  FINDINGS: CT HEAD FINDINGS  Regional soft tissues appear normal with special attention paid to the posterior aspect of the calvarium.  Similar findings of advanced atrophy with sulcal prominence and centralized volume loss with commensurate ex vacuo dilatation of the ventricular system. Stable sequela of prior infarction involving the anterior aspect of the right temporal lobe (image 10, series 21 as well as about the anterior horn of the right lateral ventricle (image 16, series 21) with unchanged ex vacuo dilatation of the right lateral ventricle. There is unchanged prominence of the bifrontal extra-axial spaces. Unchanged scattered periventricular hypodensities compatible microvascular ischemic disease. Given extensive background parenchymal abnormalities, there is no CT evidence of superimposed acute large territory infarct. No intraparenchymal or extra-axial mass or  hemorrhage. Unchanged size a configuration of the ventricles and basilar cisterns. No midline shift. Intracranial atherosclerosis.  Limited visualization of the paranasal sinuses and mastoid air cells is normal. No air-fluid levels. There is persistent leftward deviation of the nasal bone, likely the sequela of remote fracture. Post bilateral cataract surgery. No displaced calvarial fracture.  CT CERVICAL SPINE FINDINGS  C1 to the superior endplate of T1 is imaged.  Normal alignment of the cervical spine. No anterolisthesis or retrolisthesis.  The bilateral facets are normally aligned. The dens is normally positioned between the lateral masses of C1. Moderate atlantodental degenerative change. Normal atlanto axial articulations.  No fracture or static subluxation of the cervical spine. Cervical vertebral body heights are preserved. Prevertebral soft tissues are normal.  Moderate multilevel cervical spine DDD, worse at C5-C6 and C6-C7 with disc space height loss, endplate irregularity and sclerosis.  There is partial ossification of the nuchal ligament posterior to the C5 and C6 spinous processes.  No bulky cervical lymphadenopathy on this noncontrast examination. Scattered atherosclerotic plaque within the bilateral carotid bulbs.  Limited visualization lung apices is normal.  IMPRESSION: 1. No acute intracranial process. 2. Sequela of advanced atrophy, microvascular ischemic disease and prior infarcts involving the right temporal lobe and right centrum semi ovale. 3. No fracture or static subluxation of the cervical spine. 4. Moderate multilevel cervical spine DDD, worse at C5-C6 and C6-C7.   Electronically Signed   By: Simonne Come M.D.   On: 02/14/2015 21:18   Mr Brain Wo Contrast  03/12/2015   CLINICAL DATA:  Acute encephalopathy. Worsening confusion acutely. Recent falls.  EXAM: MRI HEAD WITHOUT CONTRAST  TECHNIQUE: Multiplanar, multiecho pulse sequences of the brain and surrounding structures were obtained  without intravenous contrast.  COMPARISON:  Head CT 03/11/2015.  MRI 12/12/2014.  FINDINGS: Diffusion imaging shows a 3 mm acute infarction in the deep white matter of the right posterior frontal region near the vertex. There are minimal chronic small-vessel changes of the brainstem. There are a few old small vessel cerebellar infarctions. There is cerebellar atrophy. Cerebral hemispheres show late subacute infarction in the right basal ganglia. There are a few areas of restricted diffusion internally in that area, not felt to represent true extensions or new infarctions. No acute cerebral hemispheric infarction is seen. The brain shows advanced generalized atrophy with chronic small-vessel ischemic changes throughout the white matter. There is chronic atrophy and gliosis of the right temporal lobe and in the left frontal lobe. Hemosiderin is seen along the surface of the brain and in the left frontal and right temporal lobe consistent with previous intracranial hemorrhage. No sign of acute hemorrhage. No evidence of neoplastic mass lesion, obstructive hydrocephalus or extra-axial hematoma. There are then subdural hygromas, unchanged and without mass effect. No pituitary mass. No inflammatory sinus disease. There is a small amount of fluid in the mastoid air cells.  IMPRESSION: 3 mm acute white matter infarction of the right posterior frontal lobe at the vertex in the deep white matter.  Late subacute infarction in the right temporal lobe with some residual internal areas of restricted diffusion.  Extensive atrophic and ischemic changes of a chronic nature elsewhere throughout the brain. 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 Thin subdural hygromas without mass effect.   Electronically Signed   By: Paulina Fusi M.D.   On: 03/12/2015 08:38   Dg Hand Complete Left  02/14/2015   CLINICAL DATA:  Fourth digit laceration getting out of bed. Initial encounter.  EXAM: LEFT HAND - COMPLETE 3+  VIEW  COMPARISON:  None.  FINDINGS: No acute fracture or malalignment. When accounting for mineralization at the fourth digit, no opaque foreign body suspected.  Interphalangeal joint narrowing and spurring, expected for age. Ulnar positive variance with abutment of the lunate and distal ulna, developmental versus remote distal radius fracture.  Status post partial amputation of the middle finger.  IMPRESSION: No acute osseous findings.   Electronically Signed   By: Marnee Spring  M.D.   On: 02/14/2015 21:07    CBC  Recent Labs Lab 03/11/15 1905 03/12/15 0328 03/12/15 0912 03/13/15 0415 03/14/15 0415  WBC 8.9 6.9 7.4 6.6 8.5  HGB 12.2* 11.4* 11.9* 12.1* 12.4*  HCT 36.3* 34.0* 35.6* 35.9* 36.7*  PLT 175 172 170 187 189  MCV 81.2 81.0 80.9 80.0 80.5  MCH 27.3 27.1 27.0 26.9 27.2  MCHC 33.6 33.5 33.4 33.7 33.8  RDW 14.5 14.5 14.5 14.4 14.6  LYMPHSABS 1.7 1.9  --   --   --   MONOABS 1.7* 1.2*  --   --   --   EOSABS 0.2 0.1  --   --   --   BASOSABS 0.1 0.1  --   --   --     Chemistries   Recent Labs Lab 03/11/15 1905 03/12/15 0328 03/14/15 0415  NA 136 138 140  K 4.7 4.1 4.1  CL 105 107 109  CO2 22 23 20*  GLUCOSE 106* 95 95  BUN 15 14 10   CREATININE 1.46* 1.35* 1.20  CALCIUM 8.9 8.5* 8.7*  AST 22 18  --   ALT 15* 13*  --   ALKPHOS 91 86  --   BILITOT 0.8 0.8  --    ------------------------------------------------------------------------------------------------------------------ estimated creatinine clearance is 39.2 mL/min (by C-G formula based on Cr of 1.2). ------------------------------------------------------------------------------------------------------------------ No results for input(s): HGBA1C in the last 72 hours. ------------------------------------------------------------------------------------------------------------------  Recent Labs  03/14/15 0415  CHOL 100  HDL 32*  LDLCALC 52  TRIG 78  CHOLHDL 3.1    ------------------------------------------------------------------------------------------------------------------ No results for input(s): TSH, T4TOTAL, T3FREE, THYROIDAB in the last 72 hours.  Invalid input(s): FREET3 ------------------------------------------------------------------------------------------------------------------ No results for input(s): VITAMINB12, FOLATE, FERRITIN, TIBC, IRON, RETICCTPCT in the last 72 hours.  Coagulation profile No results for input(s): INR, PROTIME in the last 168 hours.  No results for input(s): DDIMER in the last 72 hours.  Cardiac Enzymes No results for input(s): CKMB, TROPONINI, MYOGLOBIN in the last 168 hours.  Invalid input(s): CK ------------------------------------------------------------------------------------------------------------------ Invalid input(s): POCBNP    Sofia Vanmeter D.O. on 03/14/2015 at 11:36 AM  Between 7am to 7pm - Pager - [phone]  After 7pm go to [email or web address] - [email or web address]  And look for the night coverage person covering for me after hours  [facility] Office  [phone]

## 2015-03-14 NOTE — Progress Notes (Signed)
STROKE TEAM PROGRESS NOTE   HISTORY Jerry [name] is an 81 y.o. male with chronic atrial fibrillation on Eliquis, previous stroke, history of traumatic subarachnoid hemorrhage, hypertension, and chronic kidney disease admitted to [facility] 03/11/2015 with increased confusion. An MRI performed today revealed a 3 mm acute white matter infarction of the right posterior frontal lobe at the vertex in the deep white matter as well as a late subacute infarction in the right temporal lobe with some residual internal areas of restricted diffusion.   In March of this year the patient had an MRI that showed an acute large right basal ganglia acute infarct with small bilateral chronic holo hemispheric subdural hematomas.  An MRA showed near complete loss of mid basilar flow related enhancement which could reflect high-grade stenosis and/or artifact.   Currently there are no family members present. The patient is significantly confused and is unable to give any pertinent history.   Date last known well: Unable to determine Time last known well: Unable to determine tPA Given: No unknown time of onset and history of subdural hematomas   SUBJECTIVE (INTERVAL HISTORY) Still confused but improved.    OBJECTIVE Temp:  [97.5 F (36.4 C)-98.8 F (37.1 C)] 98.2 F (36.8 C) (06/19 0900) Pulse Rate:  [67-104] 80 (06/19 0900) Cardiac Rhythm:  [-]  Resp:  [18-19] 18 (06/19 0900) BP: (124-143)/(67-86) 128/67 mmHg (06/19 0900) SpO2:  [95 %-99 %] 95 % (06/19 0900)   Recent Labs Lab 03/13/15 0735 03/13/15 1141 03/13/15 1712 03/13/15 2050 03/14/15 0750  GLUCAP 83 114* 107* 106* 92    Recent Labs Lab 03/11/15 1905 03/12/15 0328 03/14/15 0415  NA 136 138 140  K 4.7 4.1 4.1  CL 105 107 109  CO2 22 23 20*  GLUCOSE 106* 95 95  BUN CREATININE 1.46* 1.35* 1.20  CALCIUM 8.9 8.5* 8.7*    Recent Labs Lab 03/11/15 1905 03/12/15 0328  AST 22 18  ALT 15* 13*  ALKPHOS 91 86   BILITOT 0.8 0.8  PROT 6.3* 5.8*  ALBUMIN 3.1* 2.8*    Recent Labs Lab 03/11/15 1905 03/12/15 0328 03/12/15 0912 03/13/15 0415 03/14/15 0415  WBC 8.9 6.9 7.4 6.6 8.5  NEUTROABS 5.3 3.6  --   --   --   HGB 12.2* 11.4* 11.9* 12.1* 12.4*  HCT 36.3* 34.0* 35.6* 35.9* 36.7*  MCV 81.2 81.0 80.9 80.0 80.5  PLT 175 172 170 187 189   No results for input(s): CKTOTAL, CKMB, CKMBINDEX, TROPONINI in the last 168 hours. No results for input(s): LABPROT, INR in the last 72 hours.  Recent Labs  03/11/15 1829  COLORURINE YELLOW  LABSPEC 1.016  PHURINE 5.5  GLUCOSEU NEGATIVE  HGBUR LARGE*  BILIRUBINUR NEGATIVE  KETONESUR NEGATIVE  PROTEINUR NEGATIVE  UROBILINOGEN 0.2  NITRITE NEGATIVE  LEUKOCYTESUR MODERATE*       Component Value Date/Time   CHOL 100 03/14/2015 0415   TRIG 78 03/14/2015 0415   HDL 32* 03/14/2015 0415   CHOLHDL 3.1 03/14/2015 0415   VLDL 16 03/14/2015 0415   LDLCALC 52 03/14/2015 0415   Lab Results  Component Value Date   HGBA1C 4.9 12/12/2014      Component Value Date/Time   LABOPIA NONE DETECTED 03/12/2015 0527   COCAINSCRNUR NONE DETECTED 03/12/2015 0527   LABBENZ NONE DETECTED 03/12/2015 0527   AMPHETMU NONE DETECTED 03/12/2015 0527   THCU NONE DETECTED 03/12/2015 0527   LABBARB NONE DETECTED 03/12/2015 0527    No results  for input(s): ETH in the last 168 hours.  Dg Chest 1 View 03/11/2015    No acute abnormality noted.    Ct Head Wo Contrast 03/11/2015    Chronic atrophic and ischemic changes as well as findings of prior infarcts. These changes are stable from the prior study.      Ct Cervical Spine Wo Contrast 03/11/2015    Healing left second rib fracture.  Left clavicular head fracture without significant displacement. No other focal acute bony abnormality is noted.  Multilevel degenerative change .  Multilevel degenerative change without acute abnormality.       Mr Brain Wo Contrast 03/12/2015    3 mm acute white matter infarction  of the right posterior frontal lobe at the vertex in the deep white matter.  Late subacute infarction in the right temporal lobe with some residual internal areas of restricted diffusion.  Extensive atrophic and ischemic changes of a chronic nature elsewhere throughout the brain. 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 Thin subdural hygromas without mass effect.       CTA of the Head and Neck 12/13/2014 No proximal anterior circulation stenosis or large vessel occlusion. Estimated 50% stenosis RIGHT V4 segment vertebral (non dominant vessel),  Estimated 50-75% stenosis of the mid basilar artery.   PHYSICAL EXAM  GENERAL: Agitated and restless but less so today.    HEENT: normal   ABDOMEN: soft  EXTREMITIES: No edema   BACK: normal  SKIN: Normal by inspection.    MENTAL STATUS: Alert but confused - says doing "OK" Follows commands both sides. Speech dysarthric   CRANIAL NERVES: Pupils are equal, round and reactive to light; extra ocular movements are full, there is no significant nystagmus; visual fields are full but direct threat; upper and lower facial muscles are normal in strength and symmetric, there is no flattening of the nasolabial folds.  MOTOR: move all 4 well  COORDINATION: Marked rest tremor and intention tremor and jerky movement; marked rigidity and cogwheeling.   SENSATION: Normal to light pain        ASSESSMENT/PLAN [name] is a 81 y.o. male with history of chronic atrial fibrillation on Eliquis, previous stroke, history of subarachnoid hemorrhage, hypertension, and chronic kidney disease presenting with confusion in the setting of a UTI.  He did not receive IV t-PA due to unknown time of onset and history of subdural hematomas.  Stroke:  Non-dominant infarct probably embolic secondary to atrial fibrillation.  Resultant  confusion  MRI  3 mm acute white matter infarction of the right posterior frontal lobe at the  vertex in the deep white matter.  MRA not performed  Carotid Doppler  not performed - (CTA of Neck 12/13/2014)  CTA of the head and neck on 12/13/2014 - see above  2D Echo - 12/14/2014 - EF 45-50% - no cardiac source of emboli identified.  LDL  - 52  HgbA1c 12/12/2014 - 4.9  Eliquis for VTE prophylaxis DIET DYS 3 Room service appropriate?: Yes with Assist; Fluid consistency:: Thin  eliquis (apixaban) prior to admission, now on eliquis (apixaban)  Ongoing aggressive stroke risk factor management  Therapy recommendations:  SNF recommended  Disposition:  Pending  Hypertension  Home meds: Norvasc 10 mg daily and Lopressor 25 mg daily   Stable   Hyperlipidemia  Home meds: Lipitor 40 mg daily resumed in hospital  LDL 52, goal < 70  Continue statin at discharge    Other Stroke Risk Factors  Advanced age  ETOH use  Hx stroke/TIA   Other Active Problems  UTI - day #3 IV Rocephin  Renal insufficiency  Mild anemia  Appears parkinsonian.  Will try sinemet 25/100 bid x 3 day then tid.   Other Pertinent History    Hospital day # 3  Dr Gerilyn Pilgrim - The patient was seen and examined by me; notes, chart and tests reviewed and discussed with midlevel provider, other providers, patient, and family.       To contact Stroke Continuity provider, please refer to [email or web address]. After hours, contact General Neurology

## 2015-03-14 NOTE — Progress Notes (Signed)
Pt restless with jerky movements- increased from yesterday, oriented to name and BD, speech muffled, O2 sat 96 RA, Dr. Catha Gosselin notified

## 2015-03-15 LAB — HEMOGLOBIN A1C
Hgb A1c MFr Bld: 5.9 % — ABNORMAL HIGH (ref 4.8–5.6)
MEAN PLASMA GLUCOSE: 123 mg/dL

## 2015-03-15 LAB — GLUCOSE, CAPILLARY
Glucose-Capillary: 73 mg/dL (ref 65–99)
Glucose-Capillary: 99 mg/dL (ref 65–99)
Glucose-Capillary: 119 mg/dL — ABNORMAL HIGH (ref 65–99)
Glucose-Capillary: 93 mg/dL (ref 65–99)

## 2015-03-15 MED ORDER — VANCOMYCIN HCL IN DEXTROSE 750-5 MG/150ML-% IV SOLN
750.0000 mg | INTRAVENOUS | Status: DC
  Administered 2015-03-15 – 2015-03-18 (×4): 750 mg via INTRAVENOUS
  Filled 2015-03-15 (×5): qty 150
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
## 2015-03-15 NOTE — Clinical Social Work Note (Addendum)
CSW spoke with [facility] who said they can take patient back and provide home health PT in facility once he is medically ready and discharge orders have been received.  CSW to continue to follow patient's discharge planning process.  Jerry [name]. Aleysha Meckler, MSW, Theresia Majors [phone] 03/15/2015 5:45 PM
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
## 2015-03-15 NOTE — Progress Notes (Addendum)
[facility]                                                                              Patient Demographics  Jerry [name], is a 81 y.o. male, DOB - Apr 02, 1924, ZOX:[id number]  Admit date - 03/11/2015   Admitting Physician Eduard Clos, MD  Outpatient Primary MD for the patient is [name], MD  LOS - 4   Chief Complaint  Patient presents with  . Altered Mental Status      HPI on 03/11/2015 by Dr. Midge Minium Jerry [name] is a 81 y.o. male with history of chronic atrial fibrillation on Apixaban, previous stroke, history of traumatic subarachnoid hemorrhage, hypertension, chronic kidney disease was brought to the ER to patient's family found the patient was increasingly confused today. Patient has been recently having frequent falls and was brought to the ER 2 weeks ago after patient sustained injury to his left hand and elbow. Patient had sutures placed on his finger and was discharged back to assisted living to study. Today patient's family found the patient was confused and was brought to the ER. Patient also has been having some jerking spells. Patient did not lose consciousness as per the family. CT of the head and neck shows fractures of the ribs and clavicle. On my exam patient is oriented to his name only. Lab work shows UTI. Patient is being admitted for further management.  Assessment & Plan   Acute encephalopathy/Acute CVA  -Possibly secondary to urinary tract infection vs CVA -CT head: Chronic atrophic and ischemic changes -MRI brain: 3mm acute white matter infarction -Neurology consulted and appreciated- feels the infarct may have been incidental, and rec continuing Eliquis -Pending repeat Hemoglobin A1c (in March 2016: 4.9) -LDL 52 -Continue eliquis -Echocardiogram 12/14/2014: EF45-50% -PT/OT consulted,  recommended SNF -Speech recommended dysphagia 3 diet -Neurology started patient on Sinemet, however, held as patient developed more jerk  type movements on 6/19.    Urinary tract infection -Initially placed on ceftrixone- but will discontinue -Urine cultures >100K eneterococcus species -Will place patient on vancomycin  Hypertension -Continue metoprolol, amlodipine  Frequent falls -PT consulted -Patient has noted healing left 2nd rib fracture  Chronic atrial fibrillation -Continue eliqius -Would consider stopping anticoagulation given age and fall risk, discussed with daughter, however, she felt she could not make a decision and would like to continue Eliquis at this time  Hyperlipidemia -Continue statin -Lipid panel: TC100, TG 78, HDL 32, LDL 52  Chronic kidney disease -Appears to be at baseline, continue to monitor BMP  History of traumatic subarachnoid hemorrhage  Code Status: Full  Family Communication: None at bedside.   Disposition Plan: Admitted. Patient continues to have confusion.   Time Spent in minutes   30 minutes  Procedures  None  Consults   Neurology   DVT Prophylaxis  Eliquis  Lab Results  Component Value Date   PLT 189 03/14/2015    Medications  Scheduled Meds: . amLODipine  10 mg Oral Daily  . apixaban  5 mg Oral BID  . atorvastatin  40 mg Oral q1800  . carbidopa-levodopa  1 tablet Oral BID   Followed by  . [START ON 03/17/2015] carbidopa-levodopa  1 tablet Oral TID  . cefTRIAXone (ROCEPHIN)  IV  1 g Intravenous Q24H  . metoprolol  25 mg Oral Daily   Continuous Infusions: . sodium chloride 50 mL/hr at 03/14/15 0155   PRN Meds:.traMADol  Antibiotics    Anti-infectives    Start     Dose/Rate Route Frequency Ordered Stop   03/12/15 2130  cefTRIAXone (ROCEPHIN) 1 g in dextrose 5 % 50 mL IVPB - Premix     1 g 100 mL/hr over 30 Minutes Intravenous Every 24 hours 03/11/15 2236     03/11/15 2045  cefTRIAXone (ROCEPHIN) 1 g in dextrose 5 % 50 mL IVPB     1 g 100 mL/hr over 30 Minutes Intravenous  Once 03/11/15 2041 03/12/15 0034        Subjective:   Jerry [name]  seen and examined today.  Patient has no complaints this morning.  Still confused, but able to follow commands and answer simple questions.  He denies chest pain, shortness of breath, abdominal pain, headache, dizziness.    Objective:   Filed Vitals:   03/14/15 2052 03/15/15 0228 03/15/15 0500 03/15/15 0930  BP: 130/95 141/75 138/67 132/70  Pulse: 103 111 112 108  Temp: 98 F (36.7 C) 98 F (36.7 C) 97.7 F (36.5 C) 98.1 F (36.7 C)  TempSrc: Axillary Axillary Oral Oral  Resp: 18 19 20 18   Height:      Weight:      SpO2: 95% 99% 100% 100%    Wt Readings from Last 3 Encounters:  03/11/15 65.1 kg (143 lb 8.3 oz)  02/08/15 67.586 kg (149 lb)  12/12/14 71.6 kg (157 lb 13.6 oz)     Intake/Output Summary (Last 24 hours) at 03/15/15 1224 Last data filed at 03/15/15 0841  Gross per 24 hour  Intake    360 ml  Output      0 ml  Net    360 ml    Exam  General: Well developed, thin, no distress  HEENT: NCAT, mucous membranes moist.   Cardiovascular: S1 S2 auscultated, irregular  Respiratory: Clear to auscultation   Abdomen: Soft, nontender, nondistended, + bowel sounds  Extremities: warm dry without cyanosis clubbing or edema  Neuro: AAO to self. Confused. Nonfocal, able to follow commands  Skin: Multiple bruises and scabs  Data Review   Micro Results Recent Results (from the past 240 hour(s))  Urine culture     Status: None (Preliminary result)   Collection Time: 03/11/15  6:22 PM  Result Value Ref Range Status   Specimen Description URINE, CATHETERIZED  Final   Special Requests Immunocompromised  Final   Culture >=100,000 COLONIES/mL ENTEROCOCCUS SPECIES  Final   Report Status PENDING  Incomplete    Radiology Reports Dg Chest 1 View  03/11/2015   CLINICAL DATA:  Altered mental status and chest pain  EXAM: CHEST  1 VIEW  COMPARISON:  03/04/2015  FINDINGS: Cardiac shadow is mildly enlarged. The known bilateral rib fractures are not well appreciated on this  exam. The lungs are well-aerated without focal infiltrate or sizable effusion. Mild interstitial changes are again seen. Postsurgical changes in the right humerus are noted.  IMPRESSION: No acute abnormality noted.   Electronically Signed   By: Alcide Clever M.D.   On: 03/11/2015 20:01   Dg Chest 2 View  02/14/2015   CLINICAL DATA:  Patient with injury to the finger. No chest complaints.  EXAM: CHEST  2 VIEW  COMPARISON:  Chest radiograph 12/11/2014  FINDINGS:  Stable enlarged cardiac and mediastinal contours. No consolidative pulmonary opacities. Calcified granuloma left upper lung. Mid thoracic spine degenerative changes. Plate and screw hardware proximal right humerus.  IMPRESSION: No acute cardiopulmonary process.   Electronically Signed   By: Annia Belt M.D.   On: 02/14/2015 21:05   Dg Wrist Complete Right  02/14/2015   CLINICAL DATA:  Right wrist pain.  EXAM: RIGHT WRIST - COMPLETE 3+ VIEW  COMPARISON:  None.  FINDINGS: No fracture or dislocation. There is degenerative change involving the scapholunate articulation. No definite erosions. No evidence of chondrocalcinosis. Adjacent vascular calcifications. No definite displacement of the pronator quadratus fat pad. Regional soft tissues appear normal. There is a punctate (approximately 3 mm) ossicle within the soft tissues of the imaged mid aspect of the palm. No associated subcutaneous emphysema. No radiopaque foreign body.  IMPRESSION: 1. No acute findings. 2. Degenerative change involving the scapholunate articulation, presumably degenerative in etiology though could be seen in the setting of an inflammatory arthropathy. Clinical correlation is advised. 3. Punctate (approximately 3 mm) ossicle within the subcutaneous tissues of the palm without associated subcutaneous emphysema. Clinical correlation is advised.   Electronically Signed   By: Simonne Come M.D.   On: 02/14/2015 21:09   Ct Head Wo Contrast  03/11/2015   CLINICAL DATA:  Multiple falls in  last few weeks, initial encounter  EXAM: CT HEAD WITHOUT CONTRAST  CT CERVICAL SPINE WITHOUT CONTRAST  TECHNIQUE: Multidetector CT imaging of the head and cervical spine was performed following the standard protocol without intravenous contrast. Multiplanar CT image reconstructions of the cervical spine were also generated.  COMPARISON:  02/14/2015  FINDINGS: CT HEAD FINDINGS  The bony calvarium is intact. Diffuse atrophic and chronic white matter ischemic changes are seen. There are changes of prior infarcts identified within the right temporal lobe and extending superiorly into the right basal ganglia and centrum semi ovale. No findings to suggest acute hemorrhage, acute infarction or space-occupying mass lesion are noted.  CT CERVICAL SPINE FINDINGS  The examination is somewhat limited by patient motion artifact. Multilevel facet hypertrophic changes are seen. Vertebral body height is well maintained. Osteophytic changes are noted worst at the C5-6 and C6-7 level. Changes consistent with healing fracture of the left second rib posteriorly are seen. The visualized lung apices are within normal limits. The surrounding soft tissues show carotid calcifications without acute abnormality. Seen only on the sagittal reconstructions there is an undisplaced fracture of the left clavicular head.  IMPRESSION: CT of the head: Chronic atrophic and ischemic changes as well as findings of prior infarcts. These changes are stable from the prior study.  CT of the cervical spine:  Healing left second rib fracture.  Left clavicular head fracture without significant displacement. No other focal acute bony abnormality is noted.  Multilevel degenerative change .  Multilevel degenerative change without acute abnormality.   Electronically Signed   By: Alcide Clever M.D.   On: 03/11/2015 20:17   Ct Head Wo Contrast  02/14/2015   CLINICAL DATA:  Post fall hitting back of head. Patient on blood thinners.  EXAM: CT HEAD WITHOUT CONTRAST   CT CERVICAL SPINE WITHOUT CONTRAST  TECHNIQUE: Multidetector CT imaging of the head and cervical spine was performed following the standard protocol without intravenous contrast. Multiplanar CT image reconstructions of the cervical spine were also generated.  COMPARISON:  12/18/2014  FINDINGS: CT HEAD FINDINGS  Regional soft tissues appear normal with special attention paid to the posterior aspect of the calvarium.  Similar findings of advanced atrophy with sulcal prominence and centralized volume loss with commensurate ex vacuo dilatation of the ventricular system. Stable sequela of prior infarction involving the anterior aspect of the right temporal lobe (image 10, series 21 as well as about the anterior horn of the right lateral ventricle (image 16, series 21) with unchanged ex vacuo dilatation of the right lateral ventricle. There is unchanged prominence of the bifrontal extra-axial spaces. Unchanged scattered periventricular hypodensities compatible microvascular ischemic disease. Given extensive background parenchymal abnormalities, there is no CT evidence of superimposed acute large territory infarct. No intraparenchymal or extra-axial mass or hemorrhage. Unchanged size a configuration of the ventricles and basilar cisterns. No midline shift. Intracranial atherosclerosis.  Limited visualization of the paranasal sinuses and mastoid air cells is normal. No air-fluid levels. There is persistent leftward deviation of the nasal bone, likely the sequela of remote fracture. Post bilateral cataract surgery. No displaced calvarial fracture.  CT CERVICAL SPINE FINDINGS  C1 to the superior endplate of T1 is imaged.  Normal alignment of the cervical spine. No anterolisthesis or retrolisthesis. The bilateral facets are normally aligned. The dens is normally positioned between the lateral masses of C1. Moderate atlantodental degenerative change. Normal atlanto axial articulations.  No fracture or static subluxation of the  cervical spine. Cervical vertebral body heights are preserved. Prevertebral soft tissues are normal.  Moderate multilevel cervical spine DDD, worse at C5-C6 and C6-C7 with disc space height loss, endplate irregularity and sclerosis.  There is partial ossification of the nuchal ligament posterior to the C5 and C6 spinous processes.  No bulky cervical lymphadenopathy on this noncontrast examination. Scattered atherosclerotic plaque within the bilateral carotid bulbs.  Limited visualization lung apices is normal.  IMPRESSION: 1. No acute intracranial process. 2. Sequela of advanced atrophy, microvascular ischemic disease and prior infarcts involving the right temporal lobe and right centrum semi ovale. 3. No fracture or static subluxation of the cervical spine. 4. Moderate multilevel cervical spine DDD, worse at C5-C6 and C6-C7.   Electronically Signed   By: Simonne Come M.D.   On: 02/14/2015 21:18   Ct Chest Wo Contrast  03/04/2015   CLINICAL DATA:  History of multiple falls, evaluate for rib lesion, former smoking history  EXAM: CT CHEST WITHOUT CONTRAST  TECHNIQUE: Multidetector CT imaging of the chest was performed following the standard protocol without IV contrast.  COMPARISON:  Chest x-ray of 02/14/2015  FINDINGS: Calcified granulomas are present within the left upper lobe consistent with prior granulomatous disease. Also there is some calcification within the spleen consistent with a benign process. No active infiltrate is seen. No pleural effusion is noted. The central airway is patent.  On bone window images, there are nondisplaced fractures of the anterior right second, third and fifth ribs with some callus formation present. On the left, there are fractures the anterior left third, fourth, fifth, and sixth ribs with some callus formation. There is a thoracic kyphosis present in the bones appear diffusely osteopenic. Very minimal compression of the superior endplate of T8 is present. The sternum appears  intact.  On soft tissue window images, the thyroid gland is unremarkable. Atheromatous change is noted involving the aortic arch and descending thoracic aorta. No mediastinal or hilar adenopathy is seen. Coronary artery calcifications are present primarily within the left anterior descending artery and cardiomegaly is noted. There does appear to be a small hiatal hernia present.  IMPRESSION: 1. Subacute fractures of the anterior right second, third, and fifth ribs with subacute fractures of  the anterior left third, fourth, fifth, and sixth ribs with some callus formation present. Diffuse osteopenia. 2. Changes of prior granulomatous these disease with granulomas in the left upper lobe. 3. Thoracic kyphosis. 4. Calcification of the left anterior descending coronary artery. 5. Small hiatal hernia.   Electronically Signed   By: Dwyane Dee M.D.   On: 03/04/2015 11:44   Ct Cervical Spine Wo Contrast  03/11/2015   CLINICAL DATA:  Multiple falls in last few weeks, initial encounter  EXAM: CT HEAD WITHOUT CONTRAST  CT CERVICAL SPINE WITHOUT CONTRAST  TECHNIQUE: Multidetector CT imaging of the head and cervical spine was performed following the standard protocol without intravenous contrast. Multiplanar CT image reconstructions of the cervical spine were also generated.  COMPARISON:  02/14/2015  FINDINGS: CT HEAD FINDINGS  The bony calvarium is intact. Diffuse atrophic and chronic white matter ischemic changes are seen. There are changes of prior infarcts identified within the right temporal lobe and extending superiorly into the right basal ganglia and centrum semi ovale. No findings to suggest acute hemorrhage, acute infarction or space-occupying mass lesion are noted.  CT CERVICAL SPINE FINDINGS  The examination is somewhat limited by patient motion artifact. Multilevel facet hypertrophic changes are seen. Vertebral body height is well maintained. Osteophytic changes are noted worst at the C5-6 and C6-7 level. Changes  consistent with healing fracture of the left second rib posteriorly are seen. The visualized lung apices are within normal limits. The surrounding soft tissues show carotid calcifications without acute abnormality. Seen only on the sagittal reconstructions there is an undisplaced fracture of the left clavicular head.  IMPRESSION: CT of the head: Chronic atrophic and ischemic changes as well as findings of prior infarcts. These changes are stable from the prior study.  CT of the cervical spine:  Healing left second rib fracture.  Left clavicular head fracture without significant displacement. No other focal acute bony abnormality is noted.  Multilevel degenerative change .  Multilevel degenerative change without acute abnormality.   Electronically Signed   By: Alcide Clever M.D.   On: 03/11/2015 20:17   Ct Cervical Spine Wo Contrast  02/14/2015   CLINICAL DATA:  Post fall hitting back of head. Patient on blood thinners.  EXAM: CT HEAD WITHOUT CONTRAST  CT CERVICAL SPINE WITHOUT CONTRAST  TECHNIQUE: Multidetector CT imaging of the head and cervical spine was performed following the standard protocol without intravenous contrast. Multiplanar CT image reconstructions of the cervical spine were also generated.  COMPARISON:  12/18/2014  FINDINGS: CT HEAD FINDINGS  Regional soft tissues appear normal with special attention paid to the posterior aspect of the calvarium.  Similar findings of advanced atrophy with sulcal prominence and centralized volume loss with commensurate ex vacuo dilatation of the ventricular system. Stable sequela of prior infarction involving the anterior aspect of the right temporal lobe (image 10, series 21 as well as about the anterior horn of the right lateral ventricle (image 16, series 21) with unchanged ex vacuo dilatation of the right lateral ventricle. There is unchanged prominence of the bifrontal extra-axial spaces. Unchanged scattered periventricular hypodensities compatible  microvascular ischemic disease. Given extensive background parenchymal abnormalities, there is no CT evidence of superimposed acute large territory infarct. No intraparenchymal or extra-axial mass or hemorrhage. Unchanged size a configuration of the ventricles and basilar cisterns. No midline shift. Intracranial atherosclerosis.  Limited visualization of the paranasal sinuses and mastoid air cells is normal. No air-fluid levels. There is persistent leftward deviation of the nasal bone, likely the  sequela of remote fracture. Post bilateral cataract surgery. No displaced calvarial fracture.  CT CERVICAL SPINE FINDINGS  C1 to the superior endplate of T1 is imaged.  Normal alignment of the cervical spine. No anterolisthesis or retrolisthesis. The bilateral facets are normally aligned. The dens is normally positioned between the lateral masses of C1. Moderate atlantodental degenerative change. Normal atlanto axial articulations.  No fracture or static subluxation of the cervical spine. Cervical vertebral body heights are preserved. Prevertebral soft tissues are normal.  Moderate multilevel cervical spine DDD, worse at C5-C6 and C6-C7 with disc space height loss, endplate irregularity and sclerosis.  There is partial ossification of the nuchal ligament posterior to the C5 and C6 spinous processes.  No bulky cervical lymphadenopathy on this noncontrast examination. Scattered atherosclerotic plaque within the bilateral carotid bulbs.  Limited visualization lung apices is normal.  IMPRESSION: 1. No acute intracranial process. 2. Sequela of advanced atrophy, microvascular ischemic disease and prior infarcts involving the right temporal lobe and right centrum semi ovale. 3. No fracture or static subluxation of the cervical spine. 4. Moderate multilevel cervical spine DDD, worse at C5-C6 and C6-C7.   Electronically Signed   By: Simonne Come M.D.   On: 02/14/2015 21:18   Mr Brain Wo Contrast  03/12/2015   CLINICAL DATA:  Acute  encephalopathy. Worsening confusion acutely. Recent falls.  EXAM: MRI HEAD WITHOUT CONTRAST  TECHNIQUE: Multiplanar, multiecho pulse sequences of the brain and surrounding structures were obtained without intravenous contrast.  COMPARISON:  Head CT 03/11/2015.  MRI 12/12/2014.  FINDINGS: Diffusion imaging shows a 3 mm acute infarction in the deep white matter of the right posterior frontal region near the vertex. There are minimal chronic small-vessel changes of the brainstem. There are a few old small vessel cerebellar infarctions. There is cerebellar atrophy. Cerebral hemispheres show late subacute infarction in the right basal ganglia. There are a few areas of restricted diffusion internally in that area, not felt to represent true extensions or new infarctions. No acute cerebral hemispheric infarction is seen. The brain shows advanced generalized atrophy with chronic small-vessel ischemic changes throughout the white matter. There is chronic atrophy and gliosis of the right temporal lobe and in the left frontal lobe. Hemosiderin is seen along the surface of the brain and in the left frontal and right temporal lobe consistent with previous intracranial hemorrhage. No sign of acute hemorrhage. No evidence of neoplastic mass lesion, obstructive hydrocephalus or extra-axial hematoma. There are then subdural hygromas, unchanged and without mass effect. No pituitary mass. No inflammatory sinus disease. There is a small amount of fluid in the mastoid air cells.  IMPRESSION: 3 mm acute white matter infarction of the right posterior frontal lobe at the vertex in the deep white matter.  Late subacute infarction in the right temporal lobe with some residual internal areas of restricted diffusion.  Extensive atrophic and ischemic changes of a chronic nature elsewhere throughout the brain. 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 Thin subdural hygromas without mass effect.   Electronically Signed    By: Paulina Fusi M.D.   On: 03/12/2015 08:38   Dg Hand Complete Left  02/14/2015   CLINICAL DATA:  Fourth digit laceration getting out of bed. Initial encounter.  EXAM: LEFT HAND - COMPLETE 3+ VIEW  COMPARISON:  None.  FINDINGS: No acute fracture or malalignment. When accounting for mineralization at the fourth digit, no opaque foreign body suspected.  Interphalangeal joint narrowing and spurring, expected for age. Ulnar positive variance with  abutment of the lunate and distal ulna, developmental versus remote distal radius fracture.  Status post partial amputation of the middle finger.  IMPRESSION: No acute osseous findings.   Electronically Signed   By: Marnee Spring M.D.   On: 02/14/2015 21:07    CBC  Recent Labs Lab 03/11/15 1905 03/12/15 0328 03/12/15 0912 03/13/15 0415 03/14/15 0415  WBC 8.9 6.9 7.4 6.6 8.5  HGB 12.2* 11.4* 11.9* 12.1* 12.4*  HCT 36.3* 34.0* 35.6* 35.9* 36.7*  PLT 175 172 170 187 189  MCV 81.2 81.0 80.9 80.0 80.5  MCH 27.3 27.1 27.0 26.9 27.2  MCHC 33.6 33.5 33.4 33.7 33.8  RDW 14.5 14.5 14.5 14.4 14.6  LYMPHSABS 1.7 1.9  --   --   --   MONOABS 1.7* 1.2*  --   --   --   EOSABS 0.2 0.1  --   --   --   BASOSABS 0.1 0.1  --   --   --     Chemistries   Recent Labs Lab 03/11/15 1905 03/12/15 0328 03/14/15 0415  NA 136 138 140  K 4.7 4.1 4.1  CL 105 107 109  CO2 22 23 20*  GLUCOSE 106* 95 95  BUN CREATININE 1.46* 1.35* 1.20  CALCIUM 8.9 8.5* 8.7*  AST 22 18  --   ALT 15* 13*  --   ALKPHOS 91 86  --   BILITOT 0.8 0.8  --    ------------------------------------------------------------------------------------------------------------------ estimated creatinine clearance is 39.2 mL/min (by C-G formula based on Cr of 1.2). ------------------------------------------------------------------------------------------------------------------ No results for input(s): HGBA1C in the last 72  hours. ------------------------------------------------------------------------------------------------------------------  Recent Labs  03/14/15 0415  CHOL 100  HDL 32*  LDLCALC 52  TRIG 78  CHOLHDL 3.1   ------------------------------------------------------------------------------------------------------------------ No results for input(s): TSH, T4TOTAL, T3FREE, THYROIDAB in the last 72 hours.  Invalid input(s): FREET3 ------------------------------------------------------------------------------------------------------------------ No results for input(s): VITAMINB12, FOLATE, FERRITIN, TIBC, IRON, RETICCTPCT in the last 72 hours.  Coagulation profile No results for input(s): INR, PROTIME in the last 168 hours.  No results for input(s): DDIMER in the last 72 hours.  Cardiac Enzymes No results for input(s): CKMB, TROPONINI, MYOGLOBIN in the last 168 hours.  Invalid input(s): CK ------------------------------------------------------------------------------------------------------------------ Invalid input(s): POCBNP    Exilda Wilhite D.O. on 03/15/2015 at 12:24 PM  Between 7am to 7pm - Pager - [phone]  After 7pm go to [email or web address] - password [email or web address]  And look for the night coverage person covering for me after hours  [facility] Office  [phone]

## 2015-03-15 NOTE — Care Management Note (Signed)
Case Management Note  Patient Details  Name: Jerry [name] MRN: [id number] Date of Birth: 05-05-1924  Subjective/Objective:                    Action/Plan: UR updated   Expected Discharge Date:        03-18-15           Expected Discharge Plan:  Skilled Nursing Facility  In-House Referral:  Clinical Social Work  Discharge planning Services     Post Acute Care Choice:    Choice offered to:     DME Arranged:    DME Agency:     HH Arranged:    HH Agency:     Status of Service:  In process, will continue to follow  Medicare Important Message Given:    Date Medicare IM Given:    Medicare IM give by:    Date Additional Medicare IM Given:    Additional Medicare Important Message give by:     If discussed at Long Length of Stay Meetings, dates discussed:    Additional Comments:  Kingsley Plan, RN 03/15/2015, 1:54 PM

## 2015-03-15 NOTE — Progress Notes (Signed)
ANTIBIOTIC CONSULT NOTE - Initial  Pharmacy Consult for Vancomycin Indication: Enterococcal UTI   Allergies  Allergen Reactions  . Hydrocodone Other (See Comments)    Reaction unknown  . Penicillins Other (See Comments)    REACTION: unknown  . Tetanus Toxoids Other (See Comments)    unknown    Patient Measurements: Height: 5\' 9"  (175.3 cm) Weight: 143 lb 8.3 oz (65.1 kg) IBW/kg (Calculated) : 70.7  Vital Signs: Temp: 98.1 F (36.7 C) (06/20 0930) Temp Source: Oral (06/20 0930) BP: 132/70 mmHg (06/20 0930) Pulse Rate: 108 (06/20 0930) Intake/Output from previous day: 06/19 0701 - 06/20 0700 In: 240 [P.O.:240] Out: -  Intake/Output from this shift: Total I/O In: 240 [P.O.:240] Out: -   Labs:  Recent Labs  03/13/15 0415 03/14/15 0415  WBC 6.6 8.5  HGB 12.1* 12.4*  PLT 187 189  CREATININE  --  1.20   Estimated Creatinine Clearance: 39.2 mL/min (by C-G formula based on Cr of 1.2). No results for input(s): VANCOTROUGH, VANCOPEAK, VANCORANDOM, GENTTROUGH, GENTPEAK, GENTRANDOM, TOBRATROUGH, TOBRAPEAK, TOBRARND, AMIKACINPEAK, AMIKACINTROU, AMIKACIN in the last 72 hours.   Microbiology: Recent Results (from the past 720 hour(s))  Urine culture     Status: None (Preliminary result)   Collection Time: 03/11/15  6:22 PM  Result Value Ref Range Status   Specimen Description URINE, CATHETERIZED  Final   Special Requests Immunocompromised  Final   Culture >=100,000 COLONIES/mL ENTEROCOCCUS SPECIES  Final   Report Status PENDING  Incomplete    Anti-infectives    Start     Dose/Rate Route Frequency Ordered Stop   03/15/15 1400  vancomycin (VANCOCIN) IVPB 750 mg/150 ml premix     750 mg 150 mL/hr over 60 Minutes Intravenous Every 24 hours 03/15/15 1253     03/12/15 2130  cefTRIAXone (ROCEPHIN) 1 g in dextrose 5 % 50 mL IVPB - Premix  Status:  Discontinued     1 g 100 mL/hr over 30 Minutes Intravenous Every 24 hours 03/11/15 2236 03/15/15 1237   03/11/15 2045   cefTRIAXone (ROCEPHIN) 1 g in dextrose 5 % 50 mL IVPB     1 g 100 mL/hr over 30 Minutes Intravenous  Once 03/11/15 2041 03/12/15 0034      Assessment: 89 YOM who presented with increasing confusion was found to have a UTI and started on IV Rocephin on 6/17. UCx today grew enterococcus. Patient has not shown much improvement. WBC wnl and he is afebrile. Now switching to empiric IV Vancomycin till susceptibilities result. CrCl ~ 40 mL/min   Note: Patient is allergic to penicillins. Allergy unknown   Goal of Therapy:  Vancomycin trough level 10-15 mcg/ml  Plan:  -Start Vancomycin 750 mg IV Q 24 hours  -Monitor CBC, renal fx, cultures and clinical progress -De-escalate as appropriate when susceptibilities results.    Vinnie Level, PharmD., BCPS Clinical Pharmacist Pager [phone]

## 2015-03-15 NOTE — Progress Notes (Signed)
STROKE TEAM PROGRESS NOTE   HISTORY Jerry [name] is an 81 y.o. male with chronic atrial fibrillation on Eliquis, previous stroke, history of traumatic subarachnoid hemorrhage, hypertension, and chronic kidney disease admitted to [facility] 03/11/2015 with increased confusion. An MRI performed today revealed a 3 mm acute white matter infarction of the right posterior frontal lobe at the vertex in the deep white matter as well as a late subacute infarction in the right temporal lobe with some residual internal areas of restricted diffusion.   In March of this year the patient had an MRI that showed an acute large right basal ganglia acute infarct with small bilateral chronic holo hemispheric subdural hematomas.  An MRA showed near complete loss of mid basilar flow related enhancement which could reflect high-grade stenosis and/or artifact.   Currently there are no family members present. The patient is significantly confused and is unable to give any pertinent history.   Date last known well: Unable to determine Time last known well: Unable to determine tPA Given: No unknown time of onset and history of subdural hematomas   SUBJECTIVE (INTERVAL HISTORY) Still confused but improved. Family feels tremors have not responded to Sinemet in fact patient may be more confused. The patient's confusion and tremors began to weeks ago following onset of uterine tract infection and confusion got worse after antibodies in the hospital. At baseline is able to ambulate with a cane or walker and does have mild dementia   OBJECTIVE Temp:  [97.3 F (36.3 C)-98.4 F (36.9 C)] 98.4 F (36.9 C) (06/20 1448) Pulse Rate:  [100-112] 110 (06/20 1448) Cardiac Rhythm:  [-]  Resp:  [18-20] 18 (06/20 1448) BP: (130-141)/(67-95) 134/68 mmHg (06/20 1448) SpO2:  [95 %-100 %] 100 % (06/20 1448)   Recent Labs Lab 03/14/15 1155 03/14/15 1706 03/14/15 2038 03/15/15 0740 03/15/15 1200  GLUCAP 102* 102* 90  73 99    Recent Labs Lab 03/11/15 1905 03/12/15 0328 03/14/15 0415  NA 136 138 140  K 4.7 4.1 4.1  CL 105 107 109  CO2 22 23 20*  GLUCOSE 106* 95 95  BUN CREATININE 1.46* 1.35* 1.20  CALCIUM 8.9 8.5* 8.7*    Recent Labs Lab 03/11/15 1905 03/12/15 0328  AST 22 18  ALT 15* 13*  ALKPHOS 91 86  BILITOT 0.8 0.8  PROT 6.3* 5.8*  ALBUMIN 3.1* 2.8*    Recent Labs Lab 03/11/15 1905 03/12/15 0328 03/12/15 0912 03/13/15 0415 03/14/15 0415  WBC 8.9 6.9 7.4 6.6 8.5  NEUTROABS 5.3 3.6  --   --   --   HGB 12.2* 11.4* 11.9* 12.1* 12.4*  HCT 36.3* 34.0* 35.6* 35.9* 36.7*  MCV 81.2 81.0 80.9 80.0 80.5  PLT 175 172 170 187 189   No results for input(s): CKTOTAL, CKMB, CKMBINDEX, TROPONINI in the last 168 hours. No results for input(s): LABPROT, INR in the last 72 hours. No results for input(s): COLORURINE, LABSPEC, PHURINE, GLUCOSEU, HGBUR, BILIRUBINUR, KETONESUR, PROTEINUR, UROBILINOGEN, NITRITE, LEUKOCYTESUR in the last 72 hours.  Invalid input(s): APPERANCEUR     Component Value Date/Time   CHOL 100 03/14/2015 0415   TRIG 78 03/14/2015 0415   HDL 32* 03/14/2015 0415   CHOLHDL 3.1 03/14/2015 0415   VLDL 16 03/14/2015 0415   LDLCALC 52 03/14/2015 0415   Lab Results  Component Value Date   HGBA1C 5.9* 03/13/2015      Component Value Date/Time   LABOPIA NONE DETECTED 03/12/2015 0527   COCAINSCRNUR  NONE DETECTED 03/12/2015 0527   LABBENZ NONE DETECTED 03/12/2015 0527   AMPHETMU NONE DETECTED 03/12/2015 0527   THCU NONE DETECTED 03/12/2015 0527   LABBARB NONE DETECTED 03/12/2015 0527    No results for input(s): ETH in the last 168 hours.  Dg Chest 1 View 03/11/2015    No acute abnormality noted.    Ct Head Wo Contrast 03/11/2015    Chronic atrophic and ischemic changes as well as findings of prior infarcts. These changes are stable from the prior study.      Ct Cervical Spine Wo Contrast 03/11/2015    Healing left second rib fracture.  Left  clavicular head fracture without significant displacement. No other focal acute bony abnormality is noted.  Multilevel degenerative change .  Multilevel degenerative change without acute abnormality.       Mr Brain Wo Contrast 03/12/2015    3 mm acute white matter infarction of the right posterior frontal lobe at the vertex in the deep white matter.  Late subacute infarction in the right temporal lobe with some residual internal areas of restricted diffusion.  Extensive atrophic and ischemic changes of a chronic nature elsewhere throughout the brain. 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 Thin subdural hygromas without mass effect.       CTA of the Head and Neck 12/13/2014 No proximal anterior circulation stenosis or large vessel occlusion. Estimated 50% stenosis RIGHT V4 segment vertebral (non dominant vessel),  Estimated 50-75% stenosis of the mid basilar artery.   PHYSICAL EXAM  GENERAL: Agitated and restless but easily redirected. Frail elderly Caucasian male    HEENT: Decreased hearing bilaterally.   ABDOMEN: soft  EXTREMITIES: No edema   BACK: normal  SKIN: Normal by inspection.    MENTAL STATUS: Alert but confused - says doing "OK" Follows commands both sides. Speech dysarthric diminished attention, registration and recall.  CRANIAL NERVES: Pupils are equal, round and reactive to light; extra ocular movements are full, there is no significant nystagmus; visual fields are full but direct threat; upper and lower facial muscles are normal in strength and symmetric, there is no flattening of the nasolabial folds.  MOTOR: move all 4 well.Marked rest tremor and intention tremor and jerky movemens with asterixis. No focal weakness  COORDINATION: Not cooperative   SENSATION: Normal to light pain  GAIT : deferred        ASSESSMENT/PLAN [name] is a 81 y.o. male with history of chronic atrial fibrillation on Eliquis, previous stroke,  history of subarachnoid hemorrhage, hypertension, and chronic kidney disease presenting with confusion in the setting of a UTI.  He did not receive IV t-PA due to unknown time of onset and history of subdural hematomas.  Stroke:  Small Non-dominant infarct probably embolic secondary to atrial fibrillation but unlikely to explain his significant mental status change and tremulousness which is likely due to encephalopathy from urinary tract infection complicated by medication effect of antibiotics  Resultant  Confusion, tremulousness  MRI  3 mm acute white matter infarction of the right posterior frontal lobe at the vertex in the deep white matter.  MRA not performed  Carotid Doppler  not performed - (CTA of Neck 12/13/2014)  CTA of the head and neck on 12/13/2014 - see above  2D Echo - 12/14/2014 - EF 45-50% - no cardiac source of emboli identified.  LDL  - 52  HgbA1c 12/12/2014 - 4.9  Eliquis for VTE prophylaxis DIET DYS 3 Room service appropriate?: Yes with Assist; Fluid  consistency:: Thin  eliquis (apixaban) prior to admission, now on eliquis (apixaban)  Ongoing aggressive stroke risk factor management  Therapy recommendations:  SNF recommended  Disposition:  Pending  Hypertension  Home meds: Norvasc 10 mg daily and Lopressor 25 mg daily   Stable   Hyperlipidemia  Home meds: Lipitor 40 mg daily resumed in hospital  LDL 52, goal < 70  Continue statin at discharge    Other Stroke Risk Factors  Advanced age  ETOH use  Hx stroke/TIA   Other Active Problems  UTI - day #3 IV Rocephin  Renal insufficiency  Mild anemia  Appears parkinsonian.  Will try sinemet 25/100 bid x 3 day then tid.   Other Pertinent History    Hospital day # 4  I had a long discussion with the family and Dr. Catha Gosselin and recommend discontinuing Sinemet as it stronger working and may be contributing to his worsening confusion. Change in antibody from ceftriaxone to vancomycin may  also help his enterococcal infection. May use low-dose; 12.5 mg if needed for agitation. The patient small stroke is likely incidental and unlikely to explain his neurological presentation. Continue present treatment. Currently call for questions.  Delia Heady, MD   To contact Stroke Continuity provider, please refer to [email or web address]. After hours, contact General Neurology

## 2015-03-16 LAB — BASIC METABOLIC PANEL
Anion gap: 12 (ref 5–15)
BUN: 10 mg/dL (ref 6–20)
CO2: 19 mmol/L — AB (ref 22–32)
CREATININE: 1.13 mg/dL (ref 0.61–1.24)
Calcium: 8.6 mg/dL — ABNORMAL LOW (ref 8.9–10.3)
Chloride: 110 mmol/L (ref 101–111)
GFR calc Af Amer: >60 mL/min (ref >60)
GFR calc non Af Amer: 56 mL/min — ABNORMAL LOW (ref >60)
GLUCOSE: 112 mg/dL — AB (ref 65–99)
Potassium: 3.8 mmol/L (ref 3.5–5.1)
Sodium: 141 mmol/L (ref 135–145)

## 2015-03-16 LAB — URINE CULTURE: Culture: >=100000

## 2015-03-16 LAB — CBC
HCT: 40.1 % (ref 39.0–52.0)
Hemoglobin: 13.2 g/dL (ref 13.0–17.0)
MCH: 27.1 pg (ref 26.0–34.0)
MCHC: 32.9 g/dL (ref 30.0–36.0)
MCV: 82.3 fL (ref 78.0–100.0)
Platelets: 203 10*3/uL (ref 150–400)
RBC: 4.87 MIL/uL (ref 4.22–5.81)
RDW: 15 % (ref 11.5–15.5)
WBC: 9.2 10*3/uL (ref 4.0–10.5)

## 2015-03-16 LAB — GLUCOSE, CAPILLARY
GLUCOSE-CAPILLARY: 90 mg/dL (ref 65–99)
GLUCOSE-CAPILLARY: 114 mg/dL — AB (ref 65–99)
GLUCOSE-CAPILLARY: 104 mg/dL — AB (ref 65–99)
Glucose-Capillary: 128 mg/dL — ABNORMAL HIGH (ref 65–99)

## 2015-03-16 MED ORDER — METOPROLOL TARTRATE 25 MG PO TABS
25.0000 mg | ORAL_TABLET | Freq: Once | ORAL | Status: AC
  Administered 2015-03-16: 25 mg via ORAL
  Filled 2015-03-16: qty 1

## 2015-03-16 MED ORDER — METOPROLOL TARTRATE 50 MG PO TABS: 50.0000 mg | ORAL_TABLET | Freq: Every day | ORAL | Status: DC

## 2015-03-16 NOTE — Progress Notes (Signed)
STROKE TEAM PROGRESS NOTE   HISTORY Jerry [name] is an 81 y.o. male with chronic atrial fibrillation on Eliquis, previous stroke, history of traumatic subarachnoid hemorrhage, hypertension, and chronic kidney disease admitted to [facility] 03/11/2015 with increased confusion. An MRI performed today revealed a 3 mm acute white matter infarction of the right posterior frontal lobe at the vertex in the deep white matter as well as a late subacute infarction in the right temporal lobe with some residual internal areas of restricted diffusion.   In March of this year the patient had an MRI that showed an acute large right basal ganglia acute infarct with small bilateral chronic holo hemispheric subdural hematomas.  An MRA showed near complete loss of mid basilar flow related enhancement which could reflect high-grade stenosis and/or artifact.   Currently there are no family members present. The patient is significantly confused and is unable to give any pertinent history.   Date last known well: Unable to determine Time last known well: Unable to determine tPA Given: No unknown time of onset and history of subdural hematomas   SUBJECTIVE (INTERVAL HISTORY) Still confused but improved per daughter who feels  tremors are also less.   OBJECTIVE Temp:  [98.1 F (36.7 C)-98.5 F (36.9 C)] 98.3 F (36.8 C) (06/21 0525) Pulse Rate:  [110-121] 121 (06/21 0525) Cardiac Rhythm:  [-]  Resp:  [17-18] 18 (06/21 0525) BP: (134-155)/(52-82) 155/82 mmHg (06/21 0525) SpO2:  [95 %-100 %] 97 % (06/21 0525)   Recent Labs Lab 03/15/15 1200 03/15/15 1712 03/15/15 2154 03/16/15 0721 03/16/15 1107  GLUCAP 99 119* 93 90 128*    Recent Labs Lab 03/11/15 1905 03/12/15 0328 03/14/15 0415 03/16/15 0545  NA 136 138 140 141  K 4.7 4.1 4.1 3.8  CL 105 107 109 110  CO2 22 23 20* 19*  GLUCOSE 106* 95 95 112*  BUN 15 14 10 10   CREATININE 1.46* 1.35* 1.20 1.13  CALCIUM 8.9 8.5* 8.7* 8.6*     Recent Labs Lab 03/11/15 1905 03/12/15 0328  AST 22 18  ALT 15* 13*  ALKPHOS 91 86  BILITOT 0.8 0.8  PROT 6.3* 5.8*  ALBUMIN 3.1* 2.8*    Recent Labs Lab 03/11/15 1905 03/12/15 0328 03/12/15 0912 03/13/15 0415 03/14/15 0415 03/16/15 0545  WBC 8.9 6.9 7.4 6.6 8.5 9.2  NEUTROABS 5.3 3.6  --   --   --   --   HGB 12.2* 11.4* 11.9* 12.1* 12.4* 13.2  HCT 36.3* 34.0* 35.6* 35.9* 36.7* 40.1  MCV 81.2 81.0 80.9 80.0 80.5 82.3  PLT 175 172 170 187 189 203   No results for input(s): CKTOTAL, CKMB, CKMBINDEX, TROPONINI in the last 168 hours. No results for input(s): LABPROT, INR in the last 72 hours. No results for input(s): COLORURINE, LABSPEC, PHURINE, GLUCOSEU, HGBUR, BILIRUBINUR, KETONESUR, PROTEINUR, UROBILINOGEN, NITRITE, LEUKOCYTESUR in the last 72 hours.  Invalid input(s): APPERANCEUR     Component Value Date/Time   CHOL 100 03/14/2015 0415   TRIG 78 03/14/2015 0415   HDL 32* 03/14/2015 0415   CHOLHDL 3.1 03/14/2015 0415   VLDL 16 03/14/2015 0415   LDLCALC 52 03/14/2015 0415   Lab Results  Component Value Date   HGBA1C 5.9* 03/13/2015      Component Value Date/Time   LABOPIA NONE DETECTED 03/12/2015 0527   COCAINSCRNUR NONE DETECTED 03/12/2015 0527   LABBENZ NONE DETECTED 03/12/2015 0527   AMPHETMU NONE DETECTED 03/12/2015 0527   THCU NONE DETECTED 03/12/2015 0527  LABBARB NONE DETECTED 03/12/2015 0527    No results for input(s): ETH in the last 168 hours.  Dg Chest 1 View 03/11/2015    No acute abnormality noted.    Ct Head Wo Contrast 03/11/2015    Chronic atrophic and ischemic changes as well as findings of prior infarcts. These changes are stable from the prior study.      Ct Cervical Spine Wo Contrast 03/11/2015    Healing left second rib fracture.  Left clavicular head fracture without significant displacement. No other focal acute bony abnormality is noted.  Multilevel degenerative change .  Multilevel degenerative change without acute  abnormality.       Mr Brain Wo Contrast 03/12/2015    3 mm acute white matter infarction of the right posterior frontal lobe at the vertex in the deep white matter.  Late subacute infarction in the right temporal lobe with some residual internal areas of restricted diffusion.  Extensive atrophic and ischemic changes of a chronic nature elsewhere throughout the brain. 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 Thin subdural hygromas without mass effect.       CTA of the Head and Neck 12/13/2014 No proximal anterior circulation stenosis or large vessel occlusion. Estimated 50% stenosis RIGHT V4 segment vertebral (non dominant vessel),  Estimated 50-75% stenosis of the mid basilar artery.   PHYSICAL EXAM  GENERAL: Agitated and restless but easily redirected. Frail elderly Caucasian male    HEENT: Decreased hearing bilaterally.   ABDOMEN: soft  EXTREMITIES: No edema   BACK: normal  SKIN: Normal by inspection.    MENTAL STATUS: Alert but confused - says doing "OK" Follows commands both sides. Speech dysarthric diminished attention, registration and recall.  CRANIAL NERVES: Pupils are equal, round and reactive to light; extra ocular movements are full, there is no significant nystagmus; visual fields are full but direct threat; upper and lower facial muscles are normal in strength and symmetric, there is no flattening of the nasolabial folds.  MOTOR: move all 4 well.Mild rest tremor and intention tremor and  Few intermittent jerky movemens with  Minimal asterixis. No focal weakness  COORDINATION: Not cooperative   SENSATION: Normal to light pain  GAIT : deferred        ASSESSMENT/PLAN [name] is a 81 y.o. male with history of chronic atrial fibrillation on Eliquis, previous stroke, history of subarachnoid hemorrhage, hypertension, and chronic kidney disease presenting with confusion in the setting of a UTI.  He did not receive IV t-PA due to  unknown time of onset and history of subdural hematomas.  Stroke:  Small Non-dominant infarct probably embolic secondary to atrial fibrillation but unlikely to explain his significant mental status change and tremulousness which is likely due to encephalopathy from urinary tract infection complicated by medication effect of antibiotics  Resultant  Confusion, tremulousness  MRI  3 mm acute white matter infarction of the right posterior frontal lobe at the vertex in the deep white matter.  MRA not performed  Carotid Doppler  not performed - (CTA of Neck 12/13/2014)  CTA of the head and neck on 12/13/2014 - see above  2D Echo - 12/14/2014 - EF 45-50% - no cardiac source of emboli identified.  LDL  - 52  HgbA1c 12/12/2014 - 4.9  Eliquis for VTE prophylaxis DIET DYS 3 Room service appropriate?: Yes with Assist; Fluid consistency:: Thin  eliquis (apixaban) prior to admission, now on eliquis (apixaban)  Ongoing aggressive stroke risk factor management  Therapy recommendations:  SNF recommended  Disposition:  Pending  Hypertension  Home meds: Norvasc 10 mg daily and Lopressor 25 mg daily   Stable   Hyperlipidemia  Home meds: Lipitor 40 mg daily resumed in hospital  LDL 52, goal < 70  Continue statin at discharge    Other Stroke Risk Factors  Advanced age  ETOH use  Hx stroke/TIA   Other Active Problems  UTI - day #3 IV Rocephin  Renal insufficiency  Mild anemia  Appears parkinsonian.  Will try sinemet 25/100 bid x 3 day then tid.   Other Pertinent History    Hospital day # 5  I had a long discussion with the  Daughter and answered questions.recommend continuing present treatment with no further neurological testing indicated at the present time. Stroke team will sign off. Currently call for questions.  Delia Heady, MD   To contact Stroke Continuity provider, please refer to [email or web address]. After hours, contact General Neurology

## 2015-03-16 NOTE — Care Management Note (Signed)
Case Management Note  Patient Details  Name: Jerry [name] MRN: [id number] Date of Birth: Nov 14, 1923  Subjective/Objective:                    Action/Plan:   Expected Discharge Date:     03-17-15              Expected Discharge Plan:  Skilled Nursing Facility  In-House Referral:  Clinical Social Work  Discharge planning Services     Post Acute Care Choice:    Choice offered to:     DME Arranged:    DME Agency:     HH Arranged:    HH Agency:     Status of Service:  In process, will continue to follow  Medicare Important Message Given:  Yes Date Medicare IM Given:  03/16/15 Medicare IM give by:  Ronny Flurry RN BSN  Date Additional Medicare IM Given:    Additional Medicare Important Message give by:     If discussed at Long Length of Stay Meetings, dates discussed:  03-16-15  Additional Comments:  Kingsley Plan, RN 03/16/2015, 11:02 AM

## 2015-03-16 NOTE — Progress Notes (Signed)
Called to verify a time for patient to receive EEG but received no answer from department. Night RN will follow up on status.

## 2015-03-16 NOTE — Progress Notes (Addendum)
[facility]                                                                              Patient Demographics  Jerry [name], is a 81 y.o. male, DOB - 12-03-1923, ZOX:[id number]  Admit date - 03/11/2015   Admitting Physician Eduard Clos, MD  Outpatient Primary MD for the patient is [name], MD  LOS - 5   Chief Complaint  Patient presents with  . Altered Mental Status      HPI on 03/11/2015 by Dr. Midge Minium Jerry [name] is a 81 y.o. male with history of chronic atrial fibrillation on Apixaban, previous stroke, history of traumatic subarachnoid hemorrhage, hypertension, chronic kidney disease was brought to the ER to patient's family found the patient was increasingly confused today. Patient has been recently having frequent falls and was brought to the ER 2 weeks ago after patient sustained injury to his left hand and elbow. Patient had sutures placed on his finger and was discharged back to assisted living to study. Today patient's family found the patient was confused and was brought to the ER. Patient also has been having some jerking spells. Patient did not lose consciousness as per the family. CT of the head and neck shows fractures of the ribs and clavicle. On my exam patient is oriented to his name only. Lab work shows UTI. Patient is being admitted for further management.  Assessment & Plan   Acute encephalopathy/Acute CVA  -Possibly secondary to urinary tract infection vs CVA -CT head: Chronic atrophic and ischemic changes -MRI brain: 3mm acute white matter infarction -Neurology consulted and appreciated- feels the infarct may have been incidental, and rec continuing Eliquis.  -Spoke with Dr. Pearlean Brownie, feels that AMS and tremors is from more from UTI.  Ceftriaxone may also worsen AMS. -Hemoglobin A1c 5.9 -LDL 52 -Continue eliquis -Echocardiogram 12/14/2014: EF45-50% -PT/OT consulted,  recommended SNF -Speech recommended dysphagia 3  diet -Neurology started patient on Sinemet, however, held as patient developed more jerk type movements on 6/19.    Urinary tract infection -Initially placed on ceftrixone- but will discontinue -Urine cultures >100K eneterococcus species -Continue vancomycin (sensitivity to ampicillin, linezolid, vanc, nitrofurantoin)  Hypertension -Continue metoprolol, amlodipine  Frequent falls -PT consulted -Patient has noted healing left 2nd rib fracture  Chronic atrial fibrillation/RVR -Continue eliqius -Would consider stopping anticoagulation given age and fall risk, discussed with daughter, however, she felt she could not make a decision and would like to continue Eliquis at this time -Will increase dose of metoprolol to 50mg  BID (patient currently tachycardic)  Hyperlipidemia -Continue statin -Lipid panel: TC100, TG 78, HDL 32, LDL 52  Chronic kidney disease -Appears to be at baseline, continue to monitor BMP  History of traumatic subarachnoid hemorrhage  Code Status: Full  Family Communication: None at bedside.   Disposition Plan: Admitted. Patient continues to have confusion- will discharge once patient's mental status improves.   Time Spent in minutes   30 minutes  Procedures  None  Consults   Neurology   DVT Prophylaxis  Eliquis  Lab Results  Component Value Date   PLT 203 03/16/2015    Medications  Scheduled Meds: . amLODipine  10 mg  Oral Daily  . apixaban  5 mg Oral BID  . atorvastatin  40 mg Oral q1800  . metoprolol  25 mg Oral Daily  . vancomycin  750 mg Intravenous Q24H   Continuous Infusions: . sodium chloride 50 mL/hr at 03/14/15 0155   PRN Meds:.traMADol  Antibiotics    Anti-infectives    Start     Dose/Rate Route Frequency Ordered Stop   03/15/15 1400  vancomycin (VANCOCIN) IVPB 750 mg/150 ml premix     750 mg 150 mL/hr over 60 Minutes Intravenous Every 24 hours 03/15/15 1253     03/12/15 2130  cefTRIAXone (ROCEPHIN) 1 g in dextrose 5 % 50 mL  IVPB - Premix  Status:  Discontinued     1 g 100 mL/hr over 30 Minutes Intravenous Every 24 hours 03/11/15 2236 03/15/15 1237   03/11/15 2045  cefTRIAXone (ROCEPHIN) 1 g in dextrose 5 % 50 mL IVPB     1 g 100 mL/hr over 30 Minutes Intravenous  Once 03/11/15 2041 03/12/15 0034        Subjective:   Jerry [name] seen and examined today.  Patient has no complaints this morning.  Still confused, but able to follow simple commands and answer simple questions.  Denies pain.     Objective:   Filed Vitals:   03/15/15 1448 03/15/15 2157 03/16/15 0132 03/16/15 0525  BP: 134/68 135/52 134/81 155/82  Pulse: 110 113 115 121  Temp: 98.4 F (36.9 C) 98.5 F (36.9 C) 98.1 F (36.7 C) 98.3 F (36.8 C)  TempSrc: Oral Axillary Oral Oral  Resp: Height:      Weight:      SpO2: 100% 95% 96% 97%    Wt Readings from Last 3 Encounters:  03/11/15 65.1 kg (143 lb 8.3 oz)  02/08/15 67.586 kg (149 lb)  12/12/14 71.6 kg (157 lb 13.6 oz)     Intake/Output Summary (Last 24 hours) at 03/16/15 1144 Last data filed at 03/16/15 1020  Gross per 24 hour  Intake    120 ml  Output      0 ml  Net    120 ml    Exam  General: Well developed, thin, no distress  HEENT: NCAT, mucous membranes moist.   Cardiovascular: S1 S2 auscultated, irregular  Respiratory: Clear to auscultation   Abdomen: Soft, nontender, nondistended, + bowel sounds  Extremities: warm dry without cyanosis clubbing or edema  Neuro: AAO to self. Nonfocal, follows commands, tremor in upper extremities  Skin: Multiple bruises and scabs  Data Review   Micro Results Recent Results (from the past 240 hour(s))  Urine culture     Status: None   Collection Time: 03/11/15  6:22 PM  Result Value Ref Range Status   Specimen Description URINE, CATHETERIZED  Final   Special Requests Immunocompromised  Final   Culture >=100,000 COLONIES/mL ENTEROCOCCUS SPECIES  Final   Report Status 03/16/2015 FINAL  Final   Organism ID,  Bacteria ENTEROCOCCUS SPECIES  Final      Susceptibility   Enterococcus species - MIC*    AMPICILLIN <=2 SENSITIVE Sensitive     LEVOFLOXACIN >=8 RESISTANT Resistant     NITROFURANTOIN <=16 SENSITIVE Sensitive     VANCOMYCIN 1 SENSITIVE Sensitive     LINEZOLID 2 SENSITIVE Sensitive     * >=100,000 COLONIES/mL ENTEROCOCCUS SPECIES    Radiology Reports Dg Chest 1 View  03/11/2015   CLINICAL DATA:  Altered mental status and chest pain  EXAM: CHEST  1 VIEW  COMPARISON:  03/04/2015  FINDINGS: Cardiac shadow is mildly enlarged. The known bilateral rib fractures are not well appreciated on this exam. The lungs are well-aerated without focal infiltrate or sizable effusion. Mild interstitial changes are again seen. Postsurgical changes in the right humerus are noted.  IMPRESSION: No acute abnormality noted.   Electronically Signed   By: Alcide Clever M.D.   On: 03/11/2015 20:01   Dg Chest 2 View  02/14/2015   CLINICAL DATA:  Patient with injury to the finger. No chest complaints.  EXAM: CHEST  2 VIEW  COMPARISON:  Chest radiograph 12/11/2014  FINDINGS: Stable enlarged cardiac and mediastinal contours. No consolidative pulmonary opacities. Calcified granuloma left upper lung. Mid thoracic spine degenerative changes. Plate and screw hardware proximal right humerus.  IMPRESSION: No acute cardiopulmonary process.   Electronically Signed   By: Annia Belt M.D.   On: 02/14/2015 21:05   Dg Wrist Complete Right  02/14/2015   CLINICAL DATA:  Right wrist pain.  EXAM: RIGHT WRIST - COMPLETE 3+ VIEW  COMPARISON:  None.  FINDINGS: No fracture or dislocation. There is degenerative change involving the scapholunate articulation. No definite erosions. No evidence of chondrocalcinosis. Adjacent vascular calcifications. No definite displacement of the pronator quadratus fat pad. Regional soft tissues appear normal. There is a punctate (approximately 3 mm) ossicle within the soft tissues of the imaged mid aspect of the palm.  No associated subcutaneous emphysema. No radiopaque foreign body.  IMPRESSION: 1. No acute findings. 2. Degenerative change involving the scapholunate articulation, presumably degenerative in etiology though could be seen in the setting of an inflammatory arthropathy. Clinical correlation is advised. 3. Punctate (approximately 3 mm) ossicle within the subcutaneous tissues of the palm without associated subcutaneous emphysema. Clinical correlation is advised.   Electronically Signed   By: Simonne Come M.D.   On: 02/14/2015 21:09   Ct Head Wo Contrast  03/11/2015   CLINICAL DATA:  Multiple falls in last few weeks, initial encounter  EXAM: CT HEAD WITHOUT CONTRAST  CT CERVICAL SPINE WITHOUT CONTRAST  TECHNIQUE: Multidetector CT imaging of the head and cervical spine was performed following the standard protocol without intravenous contrast. Multiplanar CT image reconstructions of the cervical spine were also generated.  COMPARISON:  02/14/2015  FINDINGS: CT HEAD FINDINGS  The bony calvarium is intact. Diffuse atrophic and chronic white matter ischemic changes are seen. There are changes of prior infarcts identified within the right temporal lobe and extending superiorly into the right basal ganglia and centrum semi ovale. No findings to suggest acute hemorrhage, acute infarction or space-occupying mass lesion are noted.  CT CERVICAL SPINE FINDINGS  The examination is somewhat limited by patient motion artifact. Multilevel facet hypertrophic changes are seen. Vertebral body height is well maintained. Osteophytic changes are noted worst at the C5-6 and C6-7 level. Changes consistent with healing fracture of the left second rib posteriorly are seen. The visualized lung apices are within normal limits. The surrounding soft tissues show carotid calcifications without acute abnormality. Seen only on the sagittal reconstructions there is an undisplaced fracture of the left clavicular head.  IMPRESSION: CT of the head:  Chronic atrophic and ischemic changes as well as findings of prior infarcts. These changes are stable from the prior study.  CT of the cervical spine:  Healing left second rib fracture.  Left clavicular head fracture without significant displacement. No other focal acute bony abnormality is noted.  Multilevel degenerative change .  Multilevel degenerative change without acute abnormality.  Electronically Signed   By: Alcide Clever M.D.   On: 03/11/2015 20:17   Ct Head Wo Contrast  02/14/2015   CLINICAL DATA:  Post fall hitting back of head. Patient on blood thinners.  EXAM: CT HEAD WITHOUT CONTRAST  CT CERVICAL SPINE WITHOUT CONTRAST  TECHNIQUE: Multidetector CT imaging of the head and cervical spine was performed following the standard protocol without intravenous contrast. Multiplanar CT image reconstructions of the cervical spine were also generated.  COMPARISON:  12/18/2014  FINDINGS: CT HEAD FINDINGS  Regional soft tissues appear normal with special attention paid to the posterior aspect of the calvarium.  Similar findings of advanced atrophy with sulcal prominence and centralized volume loss with commensurate ex vacuo dilatation of the ventricular system. Stable sequela of prior infarction involving the anterior aspect of the right temporal lobe (image 10, series 21 as well as about the anterior horn of the right lateral ventricle (image 16, series 21) with unchanged ex vacuo dilatation of the right lateral ventricle. There is unchanged prominence of the bifrontal extra-axial spaces. Unchanged scattered periventricular hypodensities compatible microvascular ischemic disease. Given extensive background parenchymal abnormalities, there is no CT evidence of superimposed acute large territory infarct. No intraparenchymal or extra-axial mass or hemorrhage. Unchanged size a configuration of the ventricles and basilar cisterns. No midline shift. Intracranial atherosclerosis.  Limited visualization of the paranasal  sinuses and mastoid air cells is normal. No air-fluid levels. There is persistent leftward deviation of the nasal bone, likely the sequela of remote fracture. Post bilateral cataract surgery. No displaced calvarial fracture.  CT CERVICAL SPINE FINDINGS  C1 to the superior endplate of T1 is imaged.  Normal alignment of the cervical spine. No anterolisthesis or retrolisthesis. The bilateral facets are normally aligned. The dens is normally positioned between the lateral masses of C1. Moderate atlantodental degenerative change. Normal atlanto axial articulations.  No fracture or static subluxation of the cervical spine. Cervical vertebral body heights are preserved. Prevertebral soft tissues are normal.  Moderate multilevel cervical spine DDD, worse at C5-C6 and C6-C7 with disc space height loss, endplate irregularity and sclerosis.  There is partial ossification of the nuchal ligament posterior to the C5 and C6 spinous processes.  No bulky cervical lymphadenopathy on this noncontrast examination. Scattered atherosclerotic plaque within the bilateral carotid bulbs.  Limited visualization lung apices is normal.  IMPRESSION: 1. No acute intracranial process. 2. Sequela of advanced atrophy, microvascular ischemic disease and prior infarcts involving the right temporal lobe and right centrum semi ovale. 3. No fracture or static subluxation of the cervical spine. 4. Moderate multilevel cervical spine DDD, worse at C5-C6 and C6-C7.   Electronically Signed   By: Simonne Come M.D.   On: 02/14/2015 21:18   Ct Chest Wo Contrast  03/04/2015   CLINICAL DATA:  History of multiple falls, evaluate for rib lesion, former smoking history  EXAM: CT CHEST WITHOUT CONTRAST  TECHNIQUE: Multidetector CT imaging of the chest was performed following the standard protocol without IV contrast.  COMPARISON:  Chest x-ray of 02/14/2015  FINDINGS: Calcified granulomas are present within the left upper lobe consistent with prior granulomatous  disease. Also there is some calcification within the spleen consistent with a benign process. No active infiltrate is seen. No pleural effusion is noted. The central airway is patent.  On bone window images, there are nondisplaced fractures of the anterior right second, third and fifth ribs with some callus formation present. On the left, there are fractures the anterior left third, fourth, fifth, and  sixth ribs with some callus formation. There is a thoracic kyphosis present in the bones appear diffusely osteopenic. Very minimal compression of the superior endplate of T8 is present. The sternum appears intact.  On soft tissue window images, the thyroid gland is unremarkable. Atheromatous change is noted involving the aortic arch and descending thoracic aorta. No mediastinal or hilar adenopathy is seen. Coronary artery calcifications are present primarily within the left anterior descending artery and cardiomegaly is noted. There does appear to be a small hiatal hernia present.  IMPRESSION: 1. Subacute fractures of the anterior right second, third, and fifth ribs with subacute fractures of the anterior left third, fourth, fifth, and sixth ribs with some callus formation present. Diffuse osteopenia. 2. Changes of prior granulomatous these disease with granulomas in the left upper lobe. 3. Thoracic kyphosis. 4. Calcification of the left anterior descending coronary artery. 5. Small hiatal hernia.   Electronically Signed   By: Dwyane Dee M.D.   On: 03/04/2015 11:44   Ct Cervical Spine Wo Contrast  03/11/2015   CLINICAL DATA:  Multiple falls in last few weeks, initial encounter  EXAM: CT HEAD WITHOUT CONTRAST  CT CERVICAL SPINE WITHOUT CONTRAST  TECHNIQUE: Multidetector CT imaging of the head and cervical spine was performed following the standard protocol without intravenous contrast. Multiplanar CT image reconstructions of the cervical spine were also generated.  COMPARISON:  02/14/2015  FINDINGS: CT HEAD FINDINGS   The bony calvarium is intact. Diffuse atrophic and chronic white matter ischemic changes are seen. There are changes of prior infarcts identified within the right temporal lobe and extending superiorly into the right basal ganglia and centrum semi ovale. No findings to suggest acute hemorrhage, acute infarction or space-occupying mass lesion are noted.  CT CERVICAL SPINE FINDINGS  The examination is somewhat limited by patient motion artifact. Multilevel facet hypertrophic changes are seen. Vertebral body height is well maintained. Osteophytic changes are noted worst at the C5-6 and C6-7 level. Changes consistent with healing fracture of the left second rib posteriorly are seen. The visualized lung apices are within normal limits. The surrounding soft tissues show carotid calcifications without acute abnormality. Seen only on the sagittal reconstructions there is an undisplaced fracture of the left clavicular head.  IMPRESSION: CT of the head: Chronic atrophic and ischemic changes as well as findings of prior infarcts. These changes are stable from the prior study.  CT of the cervical spine:  Healing left second rib fracture.  Left clavicular head fracture without significant displacement. No other focal acute bony abnormality is noted.  Multilevel degenerative change .  Multilevel degenerative change without acute abnormality.   Electronically Signed   By: Alcide Clever M.D.   On: 03/11/2015 20:17   Ct Cervical Spine Wo Contrast  02/14/2015   CLINICAL DATA:  Post fall hitting back of head. Patient on blood thinners.  EXAM: CT HEAD WITHOUT CONTRAST  CT CERVICAL SPINE WITHOUT CONTRAST  TECHNIQUE: Multidetector CT imaging of the head and cervical spine was performed following the standard protocol without intravenous contrast. Multiplanar CT image reconstructions of the cervical spine were also generated.  COMPARISON:  12/18/2014  FINDINGS: CT HEAD FINDINGS  Regional soft tissues appear normal with special  attention paid to the posterior aspect of the calvarium.  Similar findings of advanced atrophy with sulcal prominence and centralized volume loss with commensurate ex vacuo dilatation of the ventricular system. Stable sequela of prior infarction involving the anterior aspect of the right temporal lobe (image 10, series 21 as  well as about the anterior horn of the right lateral ventricle (image 16, series 21) with unchanged ex vacuo dilatation of the right lateral ventricle. There is unchanged prominence of the bifrontal extra-axial spaces. Unchanged scattered periventricular hypodensities compatible microvascular ischemic disease. Given extensive background parenchymal abnormalities, there is no CT evidence of superimposed acute large territory infarct. No intraparenchymal or extra-axial mass or hemorrhage. Unchanged size a configuration of the ventricles and basilar cisterns. No midline shift. Intracranial atherosclerosis.  Limited visualization of the paranasal sinuses and mastoid air cells is normal. No air-fluid levels. There is persistent leftward deviation of the nasal bone, likely the sequela of remote fracture. Post bilateral cataract surgery. No displaced calvarial fracture.  CT CERVICAL SPINE FINDINGS  C1 to the superior endplate of T1 is imaged.  Normal alignment of the cervical spine. No anterolisthesis or retrolisthesis. The bilateral facets are normally aligned. The dens is normally positioned between the lateral masses of C1. Moderate atlantodental degenerative change. Normal atlanto axial articulations.  No fracture or static subluxation of the cervical spine. Cervical vertebral body heights are preserved. Prevertebral soft tissues are normal.  Moderate multilevel cervical spine DDD, worse at C5-C6 and C6-C7 with disc space height loss, endplate irregularity and sclerosis.  There is partial ossification of the nuchal ligament posterior to the C5 and C6 spinous processes.  No bulky cervical  lymphadenopathy on this noncontrast examination. Scattered atherosclerotic plaque within the bilateral carotid bulbs.  Limited visualization lung apices is normal.  IMPRESSION: 1. No acute intracranial process. 2. Sequela of advanced atrophy, microvascular ischemic disease and prior infarcts involving the right temporal lobe and right centrum semi ovale. 3. No fracture or static subluxation of the cervical spine. 4. Moderate multilevel cervical spine DDD, worse at C5-C6 and C6-C7.   Electronically Signed   By: Simonne Come M.D.   On: 02/14/2015 21:18   Mr Brain Wo Contrast  03/12/2015   CLINICAL DATA:  Acute encephalopathy. Worsening confusion acutely. Recent falls.  EXAM: MRI HEAD WITHOUT CONTRAST  TECHNIQUE: Multiplanar, multiecho pulse sequences of the brain and surrounding structures were obtained without intravenous contrast.  COMPARISON:  Head CT 03/11/2015.  MRI 12/12/2014.  FINDINGS: Diffusion imaging shows a 3 mm acute infarction in the deep white matter of the right posterior frontal region near the vertex. There are minimal chronic small-vessel changes of the brainstem. There are a few old small vessel cerebellar infarctions. There is cerebellar atrophy. Cerebral hemispheres show late subacute infarction in the right basal ganglia. There are a few areas of restricted diffusion internally in that area, not felt to represent true extensions or new infarctions. No acute cerebral hemispheric infarction is seen. The brain shows advanced generalized atrophy with chronic small-vessel ischemic changes throughout the white matter. There is chronic atrophy and gliosis of the right temporal lobe and in the left frontal lobe. Hemosiderin is seen along the surface of the brain and in the left frontal and right temporal lobe consistent with previous intracranial hemorrhage. No sign of acute hemorrhage. No evidence of neoplastic mass lesion, obstructive hydrocephalus or extra-axial hematoma. There are then subdural  hygromas, unchanged and without mass effect. No pituitary mass. No inflammatory sinus disease. There is a small amount of fluid in the mastoid air cells.  IMPRESSION: 3 mm acute white matter infarction of the right posterior frontal lobe at the vertex in the deep white matter.  Late subacute infarction in the right temporal lobe with some residual internal areas of restricted diffusion.  Extensive atrophic and ischemic  changes of a chronic nature elsewhere throughout the brain. 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 Thin subdural hygromas without mass effect.   Electronically Signed   By: Paulina Fusi M.D.   On: 03/12/2015 08:38   Dg Hand Complete Left  02/14/2015   CLINICAL DATA:  Fourth digit laceration getting out of bed. Initial encounter.  EXAM: LEFT HAND - COMPLETE 3+ VIEW  COMPARISON:  None.  FINDINGS: No acute fracture or malalignment. When accounting for mineralization at the fourth digit, no opaque foreign body suspected.  Interphalangeal joint narrowing and spurring, expected for age. Ulnar positive variance with abutment of the lunate and distal ulna, developmental versus remote distal radius fracture.  Status post partial amputation of the middle finger.  IMPRESSION: No acute osseous findings.   Electronically Signed   By: Marnee Spring M.D.   On: 02/14/2015 21:07    CBC  Recent Labs Lab 03/11/15 1905 03/12/15 0328 03/12/15 0912 03/13/15 0415 03/14/15 0415 03/16/15 0545  WBC 8.9 6.9 7.4 6.6 8.5 9.2  HGB 12.2* 11.4* 11.9* 12.1* 12.4* 13.2  HCT 36.3* 34.0* 35.6* 35.9* 36.7* 40.1  PLT 175 172 170 187 189 203  MCV 81.2 81.0 80.9 80.0 80.5 82.3  MCH 27.3 27.1 27.0 26.9 27.2 27.1  MCHC 33.6 33.5 33.4 33.7 33.8 32.9  RDW 14.5 14.5 14.5 14.4 14.6 15.0  LYMPHSABS 1.7 1.9  --   --   --   --   MONOABS 1.7* 1.2*  --   --   --   --   EOSABS 0.2 0.1  --   --   --   --   BASOSABS 0.1 0.1  --   --   --   --     Chemistries   Recent Labs Lab 03/11/15 1905  03/12/15 0328 03/14/15 0415 03/16/15 0545  NA 136 138 140 141  K 4.7 4.1 4.1 3.8  CL 105 107 109 110  CO2 22 23 20* 19*  GLUCOSE 106* 95 95 112*  BUN 15 14 10 10   CREATININE 1.46* 1.35* 1.20 1.13  CALCIUM 8.9 8.5* 8.7* 8.6*  AST 22 18  --   --   ALT 15* 13*  --   --   ALKPHOS 91 86  --   --   BILITOT 0.8 0.8  --   --    ------------------------------------------------------------------------------------------------------------------ estimated creatinine clearance is 41.6 mL/min (by C-G formula based on Cr of 1.13). ------------------------------------------------------------------------------------------------------------------  Recent Labs  03/13/15 1400  HGBA1C 5.9*   ------------------------------------------------------------------------------------------------------------------  Recent Labs  03/14/15 0415  CHOL 100  HDL 32*  LDLCALC 52  TRIG 78  CHOLHDL 3.1   ------------------------------------------------------------------------------------------------------------------ No results for input(s): TSH, T4TOTAL, T3FREE, THYROIDAB in the last 72 hours.  Invalid input(s): FREET3 ------------------------------------------------------------------------------------------------------------------ No results for input(s): VITAMINB12, FOLATE, FERRITIN, TIBC, IRON, RETICCTPCT in the last 72 hours.  Coagulation profile No results for input(s): INR, PROTIME in the last 168 hours.  No results for input(s): DDIMER in the last 72 hours.  Cardiac Enzymes No results for input(s): CKMB, TROPONINI, MYOGLOBIN in the last 168 hours.  Invalid input(s): CK ------------------------------------------------------------------------------------------------------------------ Invalid input(s): POCBNP    Azuri Bozard D.O. on 03/16/2015 at 11:44 AM  Between 7am to 7pm - Pager - [phone]  After 7pm go to [email or web address] - password [email or web address]  And look for the night coverage person  covering for me after hours  [facility] Office  [phone]

## 2015-03-16 NOTE — Clinical Social Work Note (Signed)
CSW spoke to patient's daughter Okey Regal about discussing SNF options.  Patient's daughter agrees with PT that patient needs to go to SNF for some therapy before he is able to return back to the ALF [facility].  CSW received permission to fax out information to SNFs, and CSW also called and left a message with patient's son [name] about current bed offers, awaiting call back.  CSW will continue to follow patient's progress towards discharge.  Ervin Knack. Jkayla Spiewak, MSW, [facility] [phone] 03/16/2015 5:26 PM

## 2015-03-16 NOTE — Progress Notes (Signed)
Physical Therapy Treatment Patient Details Name: Jerry [name] MRN: [id number] DOB: [birth year] Today's Date: 03/16/2015  Patient seen for therapy session with hopes to attempt mobility and progression of activity. Upon entering the room patient resting in bed with slight tremor noted. Attempted to arouse patient to perform therapeutic activities. Patient appeared lethargic. Patient then began to have uncontrolled tremor/jerking motions that have been far worse then tremors noted by this therapist in previous session. Attempted to arouse patient with verbal and tactile cues, patient moaned briefly but continued to demonstrates jerking movements in extremities as well as facial expression twitching and would not open his eyes or engage in further verbalizations. Previously, this patient was able to engage in conversation (albeit confused- during previous session patient was able to engage in conversation regarding desire to "go get him a beer" and while not oriented, patient was able to answer questions with some appropriateness such as stating a location "home" when asked where are we) this afternoon patient was non-verbal and with completely uncontrolled movements. Notified nursing and MD regarding event. Nursing came to room, upon re entering patient jerking motions had calmed significantly, patient was verbalizing (incoherently) and was able to open eyes for nursing request.  This is a significant change compared to baseline evaluation performed by this therapist and further worse then session on 6/20.    Will continue to follow as indicated. MD and nursing aware.   No progression towards therapy goals noted at this time. Will continue to follow as indicated and progress as tolerated.  Charlotte Crumb, PT DPT  [phone]            Fabio Asa 03/16/2015, 5:27 PM

## 2015-03-17 ENCOUNTER — Inpatient Hospital Stay (HOSPITAL_COMMUNITY): Payer: Medicare Other

## 2015-03-17 LAB — BASIC METABOLIC PANEL
Anion gap: 8 (ref 5–15)
BUN: 10 mg/dL (ref 6–20)
CHLORIDE: 112 mmol/L — AB (ref 101–111)
CO2: 23 mmol/L (ref 22–32)
Calcium: 8.7 mg/dL — ABNORMAL LOW (ref 8.9–10.3)
Creatinine, Ser: 1.18 mg/dL (ref 0.61–1.24)
GFR calc Af Amer: >60 mL/min (ref >60)
GFR calc non Af Amer: 53 mL/min — ABNORMAL LOW (ref >60)
Glucose, Bld: 100 mg/dL — ABNORMAL HIGH (ref 65–99)
Potassium: 3.6 mmol/L (ref 3.5–5.1)
Sodium: 143 mmol/L (ref 135–145)

## 2015-03-17 LAB — CBC
HEMATOCRIT: 35.9 % — AB (ref 39.0–52.0)
Hemoglobin: 12 g/dL — ABNORMAL LOW (ref 13.0–17.0)
MCH: 27 pg (ref 26.0–34.0)
MCHC: 33.4 g/dL (ref 30.0–36.0)
MCV: 80.9 fL (ref 78.0–100.0)
PLATELETS: 208 10*3/uL (ref 150–400)
RBC: 4.44 MIL/uL (ref 4.22–5.81)
RDW: 15.1 % (ref 11.5–15.5)
WBC: 12.2 10*3/uL — ABNORMAL HIGH (ref 4.0–10.5)

## 2015-03-17 LAB — AMMONIA: AMMONIA: 17 umol/L (ref 9–35)

## 2015-03-17 LAB — GLUCOSE, CAPILLARY
GLUCOSE-CAPILLARY: 91 mg/dL (ref 65–99)
GLUCOSE-CAPILLARY: 87 mg/dL (ref 65–99)
GLUCOSE-CAPILLARY: 88 mg/dL (ref 65–99)
Glucose-Capillary: 98 mg/dL (ref 65–99)

## 2015-03-17 MED ORDER — METOPROLOL TARTRATE 25 MG PO TABS
25.0000 mg | ORAL_TABLET | Freq: Every day | ORAL | Status: DC
  Administered 2015-03-18 – 2015-03-19 (×2): 25 mg via ORAL
  Filled 2015-03-17 (×2): qty 1

## 2015-03-17 NOTE — Progress Notes (Signed)
[facility]                                                                              Patient Demographics  Jerry [name], is a 81 y.o. male, DOB - 10-06-1923, QIH:[id number]  Admit date - 03/11/2015   Admitting Physician Eduard Clos, MD  Outpatient Primary MD for the patient is [name], MD  LOS - 6   Chief Complaint  Patient presents with  . Altered Mental Status      HPI on 03/11/2015 by Dr. Midge Minium Jerry [name] is a 81 y.o. male with history of chronic atrial fibrillation on Apixaban, previous stroke, history of traumatic subarachnoid hemorrhage, hypertension, chronic kidney disease was brought to the ER to patient's family found the patient was increasingly confused today. Patient has been recently having frequent falls and was brought to the ER 2 weeks ago after patient sustained injury to his left hand and elbow. Patient had sutures placed on his finger and was discharged back to assisted living to study. Today patient's family found the patient was confused and was brought to the ER. Patient also has been having some jerking spells. Patient did not lose consciousness as per the family. CT of the head and neck shows fractures of the ribs and clavicle. On my exam patient is oriented to his name only. Lab work shows UTI. Patient is being admitted for further management.  Assessment & Plan   Acute encephalopathy/Acute CVA -Possibly secondary to urinary tract infection vs CVA -Despite change in antibiotics, patient continues to have altered mental status- not at baseline -CT head: Chronic atrophic and ischemic changes -MRI brain: 36mm acute white matter infarction -Neurology consulted and appreciated- feels the infarct may have been incidental, and rec continuing Eliquis.  -Spoke with Dr. Pearlean Brownie, feels that AMS and tremors is from more from UTI.  Ceftriaxone may also worsen AMS. -Hemoglobin A1c 5.9 -LDL 52 -Continue eliquis -Echocardiogram  12/14/2014: EF45-50% -PT/OT consulted,  recommended SNF -Speech recommended dysphagia 3 diet -Neurology started patient on Sinemet, however, discontinued as patient developed more jerk type movements on 6/19.    ?New seizure -Patient has been having tremors, however, during PT evaluation on 03/16/2015, questionable seizure activity -EEG ordered and pending -Have asked neurology to continue following  Urinary tract infection -Initially placed on ceftrixone- but will discontinue -Urine cultures >100K eneterococcus species -Continue vancomycin (sensitivity to ampicillin, linezolid, vanc, nitrofurantoin)  Leukocytosis -Possible secondary to UTI vs reactive -Will obtain repeat blood cultures -Currently afebrile -Continue to monitor CBC  Hypertension -Continue metoprolol, amlodipine  Frequent falls -PT consulted -Patient has noted healing left 2nd rib fracture  Chronic atrial fibrillation -Continue eliqius and metoprolol -Would consider stopping anticoagulation given age and fall risk, discussed with daughter, however, she felt she could not make a decision and would like to continue Eliquis at this time  Hyperlipidemia -Continue statin -Lipid panel: TC100, TG 78, HDL 32, LDL 52  Chronic kidney disease -Appears to be at baseline, continue to monitor BMP  History of traumatic subarachnoid hemorrhage  Code Status: Full  Family Communication: None at bedside.   Disposition Plan: Admitted. Patient continues to have confusion- will discharge once patient's mental status  improves.   Time Spent in minutes   30 minutes  Procedures  None  Consults   Neurology   DVT Prophylaxis  Eliquis  Lab Results  Component Value Date   PLT 208 03/17/2015    Medications  Scheduled Meds: . amLODipine  10 mg Oral Daily  . apixaban  5 mg Oral BID  . atorvastatin  40 mg Oral q1800  . metoprolol  50 mg Oral Daily  . vancomycin  750 mg Intravenous Q24H   Continuous Infusions: .  sodium chloride 50 mL/hr at 03/16/15 1721   PRN Meds:.traMADol  Antibiotics    Anti-infectives    Start     Dose/Rate Route Frequency Ordered Stop   03/15/15 1400  vancomycin (VANCOCIN) IVPB 750 mg/150 ml premix     750 mg 150 mL/hr over 60 Minutes Intravenous Every 24 hours 03/15/15 1253     03/12/15 2130  cefTRIAXone (ROCEPHIN) 1 g in dextrose 5 % 50 mL IVPB - Premix  Status:  Discontinued     1 g 100 mL/hr over 30 Minutes Intravenous Every 24 hours 03/11/15 2236 03/15/15 1237   03/11/15 2045  cefTRIAXone (ROCEPHIN) 1 g in dextrose 5 % 50 mL IVPB     1 g 100 mL/hr over 30 Minutes Intravenous  Once 03/11/15 2041 03/12/15 0034        Subjective:   Jerry [name] seen and examined today.  Less interactive this morning.  Moving arms sporadically.    Objective:   Filed Vitals:   03/16/15 2159 03/17/15 0251 03/17/15 0700 03/17/15 1041  BP: 112/66 162/71 142/62 140/121  Pulse: 104 104 100 52  Temp: 98.1 F (36.7 C) 98.1 F (36.7 C) 98 F (36.7 C) 98 F (36.7 C)  TempSrc: Oral Oral Oral Oral  Resp: 18 18 18 18   Height:      Weight:      SpO2: 98% 99% 98% 97%    Wt Readings from Last 3 Encounters:  03/11/15 65.1 kg (143 lb 8.3 oz)  02/08/15 67.586 kg (149 lb)  12/12/14 71.6 kg (157 lb 13.6 oz)     Intake/Output Summary (Last 24 hours) at 03/17/15 1202 Last data filed at 03/17/15 0830  Gross per 24 hour  Intake    240 ml  Output      0 ml  Net    240 ml    Exam  General: Well developed, elderly thin  HEENT: NCAT, mucous membranes moist.   Cardiovascular: S1 S2 auscultated, irregular  Respiratory: Clear to auscultation   Abdomen: Soft, nontender, nondistended, + bowel sounds  Extremities: warm dry without cyanosis clubbing or edema  Neuro: Not interactive today.  Moving extremities sporadically.  Skin: Multiple bruises, abrasions, and scabs  Data Review   Micro Results Recent Results (from the past 240 hour(s))  Urine culture     Status: None    Collection Time: 03/11/15  6:22 PM  Result Value Ref Range Status   Specimen Description URINE, CATHETERIZED  Final   Special Requests Immunocompromised  Final   Culture >=100,000 COLONIES/mL ENTEROCOCCUS SPECIES  Final   Report Status 03/16/2015 FINAL  Final   Organism ID, Bacteria ENTEROCOCCUS SPECIES  Final      Susceptibility   Enterococcus species - MIC*    AMPICILLIN <=2 SENSITIVE Sensitive     LEVOFLOXACIN >=8 RESISTANT Resistant     NITROFURANTOIN <=16 SENSITIVE Sensitive     VANCOMYCIN 1 SENSITIVE Sensitive     LINEZOLID 2 SENSITIVE Sensitive     * >=  100,000 COLONIES/mL ENTEROCOCCUS SPECIES    Radiology Reports Dg Chest 1 View  03/11/2015   CLINICAL DATA:  Altered mental status and chest pain  EXAM: CHEST  1 VIEW  COMPARISON:  03/04/2015  FINDINGS: Cardiac shadow is mildly enlarged. The known bilateral rib fractures are not well appreciated on this exam. The lungs are well-aerated without focal infiltrate or sizable effusion. Mild interstitial changes are again seen. Postsurgical changes in the right humerus are noted.  IMPRESSION: No acute abnormality noted.   Electronically Signed   By: Alcide Clever M.D.   On: 03/11/2015 20:01   Ct Head Wo Contrast  03/11/2015   CLINICAL DATA:  Multiple falls in last few weeks, initial encounter  EXAM: CT HEAD WITHOUT CONTRAST  CT CERVICAL SPINE WITHOUT CONTRAST  TECHNIQUE: Multidetector CT imaging of the head and cervical spine was performed following the standard protocol without intravenous contrast. Multiplanar CT image reconstructions of the cervical spine were also generated.  COMPARISON:  02/14/2015  FINDINGS: CT HEAD FINDINGS  The bony calvarium is intact. Diffuse atrophic and chronic white matter ischemic changes are seen. There are changes of prior infarcts identified within the right temporal lobe and extending superiorly into the right basal ganglia and centrum semi ovale. No findings to suggest acute hemorrhage, acute infarction or  space-occupying mass lesion are noted.  CT CERVICAL SPINE FINDINGS  The examination is somewhat limited by patient motion artifact. Multilevel facet hypertrophic changes are seen. Vertebral body height is well maintained. Osteophytic changes are noted worst at the C5-6 and C6-7 level. Changes consistent with healing fracture of the left second rib posteriorly are seen. The visualized lung apices are within normal limits. The surrounding soft tissues show carotid calcifications without acute abnormality. Seen only on the sagittal reconstructions there is an undisplaced fracture of the left clavicular head.  IMPRESSION: CT of the head: Chronic atrophic and ischemic changes as well as findings of prior infarcts. These changes are stable from the prior study.  CT of the cervical spine:  Healing left second rib fracture.  Left clavicular head fracture without significant displacement. No other focal acute bony abnormality is noted.  Multilevel degenerative change .  Multilevel degenerative change without acute abnormality.   Electronically Signed   By: Alcide Clever M.D.   On: 03/11/2015 20:17   Ct Chest Wo Contrast  03/04/2015   CLINICAL DATA:  History of multiple falls, evaluate for rib lesion, former smoking history  EXAM: CT CHEST WITHOUT CONTRAST  TECHNIQUE: Multidetector CT imaging of the chest was performed following the standard protocol without IV contrast.  COMPARISON:  Chest x-ray of 02/14/2015  FINDINGS: Calcified granulomas are present within the left upper lobe consistent with prior granulomatous disease. Also there is some calcification within the spleen consistent with a benign process. No active infiltrate is seen. No pleural effusion is noted. The central airway is patent.  On bone window images, there are nondisplaced fractures of the anterior right second, third and fifth ribs with some callus formation present. On the left, there are fractures the anterior left third, fourth, fifth, and sixth ribs  with some callus formation. There is a thoracic kyphosis present in the bones appear diffusely osteopenic. Very minimal compression of the superior endplate of T8 is present. The sternum appears intact.  On soft tissue window images, the thyroid gland is unremarkable. Atheromatous change is noted involving the aortic arch and descending thoracic aorta. No mediastinal or hilar adenopathy is seen. Coronary artery calcifications are present primarily  within the left anterior descending artery and cardiomegaly is noted. There does appear to be a small hiatal hernia present.  IMPRESSION: 1. Subacute fractures of the anterior right second, third, and fifth ribs with subacute fractures of the anterior left third, fourth, fifth, and sixth ribs with some callus formation present. Diffuse osteopenia. 2. Changes of prior granulomatous these disease with granulomas in the left upper lobe. 3. Thoracic kyphosis. 4. Calcification of the left anterior descending coronary artery. 5. Small hiatal hernia.   Electronically Signed   By: Dwyane Dee M.D.   On: 03/04/2015 11:44   Ct Cervical Spine Wo Contrast  03/11/2015   CLINICAL DATA:  Multiple falls in last few weeks, initial encounter  EXAM: CT HEAD WITHOUT CONTRAST  CT CERVICAL SPINE WITHOUT CONTRAST  TECHNIQUE: Multidetector CT imaging of the head and cervical spine was performed following the standard protocol without intravenous contrast. Multiplanar CT image reconstructions of the cervical spine were also generated.  COMPARISON:  02/14/2015  FINDINGS: CT HEAD FINDINGS  The bony calvarium is intact. Diffuse atrophic and chronic white matter ischemic changes are seen. There are changes of prior infarcts identified within the right temporal lobe and extending superiorly into the right basal ganglia and centrum semi ovale. No findings to suggest acute hemorrhage, acute infarction or space-occupying mass lesion are noted.  CT CERVICAL SPINE FINDINGS  The examination is somewhat  limited by patient motion artifact. Multilevel facet hypertrophic changes are seen. Vertebral body height is well maintained. Osteophytic changes are noted worst at the C5-6 and C6-7 level. Changes consistent with healing fracture of the left second rib posteriorly are seen. The visualized lung apices are within normal limits. The surrounding soft tissues show carotid calcifications without acute abnormality. Seen only on the sagittal reconstructions there is an undisplaced fracture of the left clavicular head.  IMPRESSION: CT of the head: Chronic atrophic and ischemic changes as well as findings of prior infarcts. These changes are stable from the prior study.  CT of the cervical spine:  Healing left second rib fracture.  Left clavicular head fracture without significant displacement. No other focal acute bony abnormality is noted.  Multilevel degenerative change .  Multilevel degenerative change without acute abnormality.   Electronically Signed   By: Alcide Clever M.D.   On: 03/11/2015 20:17   Mr Brain Wo Contrast  03/12/2015   CLINICAL DATA:  Acute encephalopathy. Worsening confusion acutely. Recent falls.  EXAM: MRI HEAD WITHOUT CONTRAST  TECHNIQUE: Multiplanar, multiecho pulse sequences of the brain and surrounding structures were obtained without intravenous contrast.  COMPARISON:  Head CT 03/11/2015.  MRI 12/12/2014.  FINDINGS: Diffusion imaging shows a 3 mm acute infarction in the deep white matter of the right posterior frontal region near the vertex. There are minimal chronic small-vessel changes of the brainstem. There are a few old small vessel cerebellar infarctions. There is cerebellar atrophy. Cerebral hemispheres show late subacute infarction in the right basal ganglia. There are a few areas of restricted diffusion internally in that area, not felt to represent true extensions or new infarctions. No acute cerebral hemispheric infarction is seen. The brain shows advanced generalized atrophy with  chronic small-vessel ischemic changes throughout the white matter. There is chronic atrophy and gliosis of the right temporal lobe and in the left frontal lobe. Hemosiderin is seen along the surface of the brain and in the left frontal and right temporal lobe consistent with previous intracranial hemorrhage. No sign of acute hemorrhage. No evidence of neoplastic mass lesion,  obstructive hydrocephalus or extra-axial hematoma. There are then subdural hygromas, unchanged and without mass effect. No pituitary mass. No inflammatory sinus disease. There is a small amount of fluid in the mastoid air cells.  IMPRESSION: 3 mm acute white matter infarction of the right posterior frontal lobe at the vertex in the deep white matter.  Late subacute infarction in the right temporal lobe with some residual internal areas of restricted diffusion.  Extensive atrophic and ischemic changes of a chronic nature elsewhere throughout the brain. 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 Thin subdural hygromas without mass effect.   Electronically Signed   By: Paulina Fusi M.D.   On: 03/12/2015 08:38    CBC  Recent Labs Lab 03/11/15 1905 03/12/15 0328 03/12/15 0912 03/13/15 0415 03/14/15 0415 03/16/15 0545 03/17/15 0940  WBC 8.9 6.9 7.4 6.6 8.5 9.2 12.2*  HGB 12.2* 11.4* 11.9* 12.1* 12.4* 13.2 12.0*  HCT 36.3* 34.0* 35.6* 35.9* 36.7* 40.1 35.9*  PLT 175 172 170 187 189 203 208  MCV 81.2 81.0 80.9 80.0 80.5 82.3 80.9  MCH 27.3 27.1 27.0 26.9 27.2 27.1 27.0  MCHC 33.6 33.5 33.4 33.7 33.8 32.9 33.4  RDW 14.5 14.5 14.5 14.4 14.6 15.0 15.1  LYMPHSABS 1.7 1.9  --   --   --   --   --   MONOABS 1.7* 1.2*  --   --   --   --   --   EOSABS 0.2 0.1  --   --   --   --   --   BASOSABS 0.1 0.1  --   --   --   --   --     Chemistries   Recent Labs Lab 03/11/15 1905 03/12/15 0328 03/14/15 0415 03/16/15 0545 03/17/15 0940  NA 136 138 140 141 143  K 4.7 4.1 4.1 3.8 3.6  CL 105 107 109 110 112*    CO2 22 23 20* 19* 23  GLUCOSE 106* 95 95 112* 100*  BUN CREATININE 1.46* 1.35* 1.20 1.13 1.18  CALCIUM 8.9 8.5* 8.7* 8.6* 8.7*  AST 22 18  --   --   --   ALT 15* 13*  --   --   --   ALKPHOS 91 86  --   --   --   BILITOT 0.8 0.8  --   --   --    ------------------------------------------------------------------------------------------------------------------ estimated creatinine clearance is 39.8 mL/min (by C-G formula based on Cr of 1.18). ------------------------------------------------------------------------------------------------------------------ No results for input(s): HGBA1C in the last 72 hours. ------------------------------------------------------------------------------------------------------------------ No results for input(s): CHOL, HDL, LDLCALC, TRIG, CHOLHDL, LDLDIRECT in the last 72 hours. ------------------------------------------------------------------------------------------------------------------ No results for input(s): TSH, T4TOTAL, T3FREE, THYROIDAB in the last 72 hours.  Invalid input(s): FREET3 ------------------------------------------------------------------------------------------------------------------ No results for input(s): VITAMINB12, FOLATE, FERRITIN, TIBC, IRON, RETICCTPCT in the last 72 hours.  Coagulation profile No results for input(s): INR, PROTIME in the last 168 hours.  No results for input(s): DDIMER in the last 72 hours.  Cardiac Enzymes No results for input(s): CKMB, TROPONINI, MYOGLOBIN in the last 168 hours.  Invalid input(s): CK ------------------------------------------------------------------------------------------------------------------ Invalid input(s): POCBNP    Janiyha Montufar D.O. on 03/17/2015 at 12:02 PM  Between 7am to 7pm - Pager - [phone]  After 7pm go to [email or web address] - password [email or web address]  And look for the night coverage person covering for me after hours  [facility] Office   [phone]

## 2015-03-17 NOTE — Procedures (Signed)
ELECTROENCEPHALOGRAM REPORT   Patient: Jerry [name]       Room #: 9J67 EEG No. ID: [id number] Age: 81 y.o.        Sex: male Referring Physician: Catha Gosselin Report Date:  03/17/2015        Interpreting Physician: Thana Farr  History: [name] is an 81 y.o. male with increasing confusion and myoclonus  Medications:  Scheduled: . amLODipine  10 mg Oral Daily  . apixaban  5 mg Oral BID  . atorvastatin  40 mg Oral q1800  . [START ON 03/18/2015] metoprolol  25 mg Oral Daily  . vancomycin  750 mg Intravenous Q24H    Conditions of Recording:  This is a 16 channel EEG carried out with the patient in the awake state.  Description:  The waking background activity consists of a low voltage mixture of theta and delta activity.  This activity is poorly organized and diffusely distributed.  It is continuous.  Maximum frequency noted is 6Hz  theta, although the dominant rhythms are actually much slower and often in the delta range.  The patient has multiple episodes of myoclonus that are captured during the recording.  Although there is a large amount of artifact noted during these episodes, no distinct epileptiforom activity is noted.   Stage II sleep is not obtained. Hyperventilation and intermittent photic stimulation were not performed.   IMPRESSION: This is an abnormal EEG secondary to general background slowing.  This finding may be seen with a diffuse disturbance that is etiologically nonspecific, but may include a metabolic encephalopathy, among other possibilities.  Multiple myoclonic episodes were captured.  No epileptiform activity was noted, only artifact was appreciated.       Thana Farr, MD [facility] [phone] 03/17/2015, 12:33 PM

## 2015-03-17 NOTE — Progress Notes (Signed)
EEG Completed; Results Pending  

## 2015-03-17 NOTE — Clinical Social Work Note (Signed)
CSW spoke with patient's daughter Okey Regal and presented bed offers for patient.  Patient's daughter said she will talk with her brother [name] tonight and inform CSW of what beds they have chosen.  CSW to continue to follow patient throughout discharge process.  Ervin Knack. Harmoney Sienkiewicz, MSW, [facility] [phone] 03/17/2015 1:06 PM
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
## 2015-03-17 NOTE — Progress Notes (Signed)
STROKE TEAM PROGRESS NOTE   HISTORY Jerry [name] is an 81 y.o. male with chronic atrial fibrillation on Eliquis, previous stroke, history of traumatic subarachnoid hemorrhage, hypertension, and chronic kidney disease admitted to [facility] 03/11/2015 with increased confusion. An MRI performed today revealed a 3 mm acute white matter infarction of the right posterior frontal lobe at the vertex in the deep white matter as well as a late subacute infarction in the right temporal lobe with some residual internal areas of restricted diffusion.   In March of this year the patient had an MRI that showed an acute large right basal ganglia acute infarct with small bilateral chronic holo hemispheric subdural hematomas.  An MRA showed near complete loss of mid basilar flow related enhancement which could reflect high-grade stenosis and/or artifact.   Currently there are no family members present. The patient is significantly confused and is unable to give any pertinent history.   Date last known well: Unable to determine Time last known well: Unable to determine tPA Given: No unknown time of onset and history of subdural hematomas   SUBJECTIVE (INTERVAL HISTORY) Still confused but improved per daughter who feels  tremors and myoclonic jerks are also less.  EEG done yesterday shows generalized slowing consistent with metabolic encephalopathy with frequent myoclonic jerks without EEG correlates for seizures OBJECTIVE Temp:  [98 F (36.7 C)-98.5 F (36.9 C)] 98.5 F (36.9 C) (06/22 1442) Pulse Rate:  [52-104] 75 (06/22 1442) Cardiac Rhythm:  [-]  Resp:  [18] 18 (06/22 1442) BP: (112-162)/(62-121) 134/65 mmHg (06/22 1442) SpO2:  [97 %-99 %] 98 % (06/22 1442)   Recent Labs Lab 03/16/15 1107 03/16/15 1655 03/16/15 2055 03/17/15 0754 03/17/15 1146  GLUCAP 128* 114* 104* 91 87    Recent Labs Lab 03/11/15 1905 03/12/15 0328 03/14/15 0415 03/16/15 0545 03/17/15 0940  NA 136 138  140 141 143  K 4.7 4.1 4.1 3.8 3.6  CL 105 107 109 110 112*  CO2 22 23 20* 19* 23  GLUCOSE 106* 95 95 112* 100*  BUN CREATININE 1.46* 1.35* 1.20 1.13 1.18  CALCIUM 8.9 8.5* 8.7* 8.6* 8.7*    Recent Labs Lab 03/11/15 1905 03/12/15 0328  AST 22 18  ALT 15* 13*  ALKPHOS 91 86  BILITOT 0.8 0.8  PROT 6.3* 5.8*  ALBUMIN 3.1* 2.8*    Recent Labs Lab 03/11/15 1905 03/12/15 0328 03/12/15 0912 03/13/15 0415 03/14/15 0415 03/16/15 0545 03/17/15 0940  WBC 8.9 6.9 7.4 6.6 8.5 9.2 12.2*  NEUTROABS 5.3 3.6  --   --   --   --   --   HGB 12.2* 11.4* 11.9* 12.1* 12.4* 13.2 12.0*  HCT 36.3* 34.0* 35.6* 35.9* 36.7* 40.1 35.9*  MCV 81.2 81.0 80.9 80.0 80.5 82.3 80.9  PLT 175 172 170 187 189 203 208   No results for input(s): CKTOTAL, CKMB, CKMBINDEX, TROPONINI in the last 168 hours. No results for input(s): LABPROT, INR in the last 72 hours. No results for input(s): COLORURINE, LABSPEC, PHURINE, GLUCOSEU, HGBUR, BILIRUBINUR, KETONESUR, PROTEINUR, UROBILINOGEN, NITRITE, LEUKOCYTESUR in the last 72 hours.  Invalid input(s): APPERANCEUR     Component Value Date/Time   CHOL 100 03/14/2015 0415   TRIG 78 03/14/2015 0415   HDL 32* 03/14/2015 0415   CHOLHDL 3.1 03/14/2015 0415   VLDL 16 03/14/2015 0415   LDLCALC 52 03/14/2015 0415   Lab Results  Component Value Date   HGBA1C 5.9* 03/13/2015  Component Value Date/Time   LABOPIA NONE DETECTED 03/12/2015 0527   COCAINSCRNUR NONE DETECTED 03/12/2015 0527   LABBENZ NONE DETECTED 03/12/2015 0527   AMPHETMU NONE DETECTED 03/12/2015 0527   THCU NONE DETECTED 03/12/2015 0527   LABBARB NONE DETECTED 03/12/2015 0527    No results for input(s): ETH in the last 168 hours.  Dg Chest 1 View 03/11/2015    No acute abnormality noted.    Ct Head Wo Contrast 03/11/2015    Chronic atrophic and ischemic changes as well as findings of prior infarcts. These changes are stable from the prior study.      Ct Cervical Spine  Wo Contrast 03/11/2015    Healing left second rib fracture.  Left clavicular head fracture without significant displacement. No other focal acute bony abnormality is noted.  Multilevel degenerative change .  Multilevel degenerative change without acute abnormality.       Mr Brain Wo Contrast 03/12/2015    3 mm acute white matter infarction of the right posterior frontal lobe at the vertex in the deep white matter.  Late subacute infarction in the right temporal lobe with some residual internal areas of restricted diffusion.  Extensive atrophic and ischemic changes of a chronic nature elsewhere throughout the brain. 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 Thin subdural hygromas without mass effect.       CTA of the Head and Neck 12/13/2014 No proximal anterior circulation stenosis or large vessel occlusion. Estimated 50% stenosis RIGHT V4 segment vertebral (non dominant vessel),  Estimated 50-75% stenosis of the mid basilar artery.   PHYSICAL EXAM  GENERAL: Agitated and restless but easily redirected. Frail elderly Caucasian male    HEENT: Decreased hearing bilaterally.   ABDOMEN: soft  EXTREMITIES: No edema   BACK: normal  SKIN: Normal by inspection.    MENTAL STATUS: Alert but confused - says doing "OK" Follows commands both sides. Speech dysarthric diminished attention, registration and recall.  CRANIAL NERVES: Pupils are equal, round and reactive to light; extra ocular movements are full, there is no significant nystagmus; visual fields are full but direct threat; upper and lower facial muscles are normal in strength and symmetric, there is no flattening of the nasolabial folds.  MOTOR: move all 4 well.Mild rest tremor and intention tremor and  Frequent intermittent  Myoclonic jerky movemens with  Minimal asterixis. No focal weakness  COORDINATION: Not cooperative   SENSATION: Normal to light pain  GAIT :  deferred        ASSESSMENT/PLAN Jerry [name] is a 81 y.o. male with history of chronic atrial fibrillation on Eliquis, previous stroke, history of subarachnoid hemorrhage, hypertension, and chronic kidney disease presenting with confusion in the setting of a UTI.  He did not receive IV t-PA due to unknown time of onset and history of subdural hematomas.  Stroke:  Small Non-dominant infarct probably embolic secondary to atrial fibrillation but unlikely to explain his significant mental status change and tremulousness which is likely due to encephalopathy from urinary tract infection complicated by medication effect of antibiotics  Resultant  Confusion, tremulousness  MRI  3 mm acute white matter infarction of the right posterior frontal lobe at the vertex in the deep white matter.  MRA not performed  Carotid Doppler  not performed - (CTA of Neck 12/13/2014)  CTA of the head and neck on 12/13/2014 - see above  2D Echo - 12/14/2014 - EF 45-50% - no cardiac source of emboli identified.  LDL  - 52  HgbA1c 12/12/2014 - 4.9  Eliquis for VTE prophylaxis DIET DYS 2 Room service appropriate?: Yes; Fluid consistency:: Thin  eliquis (apixaban) prior to admission, now on eliquis (apixaban)  Ongoing aggressive stroke risk factor management  Therapy recommendations:  SNF recommended  Disposition:  Pending  Hypertension  Home meds: Norvasc 10 mg daily and Lopressor 25 mg daily   Stable   Hyperlipidemia  Home meds: Lipitor 40 mg daily resumed in hospital  LDL 52, goal < 70  Continue statin at discharge    Other Stroke Risk Factors  Advanced age  ETOH use  Hx stroke/TIA   Other Active Problems  UTI - day #3 IV Rocephin  Renal insufficiency  Mild anemia   Other Pertinent History    Hospital day # 6  I had a long discussion with the  Daughter and answered questions.recommend continuing present treatment with no further neurological testing indicated at  the present time. Stroke team will sign off. Currently call for questions.  Delia Heady, MD   To contact Stroke Continuity provider, please refer to [email or web address]. After hours, contact General Neurology

## 2015-03-18 DIAGNOSIS — E43 Unspecified severe protein-calorie malnutrition: Secondary | ICD-10-CM | POA: Insufficient documentation

## 2015-03-18 LAB — BASIC METABOLIC PANEL
Anion gap: 8 (ref 5–15)
BUN: 8 mg/dL (ref 6–20)
CO2: 22 mmol/L (ref 22–32)
Calcium: 8.5 mg/dL — ABNORMAL LOW (ref 8.9–10.3)
Chloride: 115 mmol/L — ABNORMAL HIGH (ref 101–111)
Creatinine, Ser: 1.11 mg/dL (ref 0.61–1.24)
GFR, EST NON AFRICAN AMERICAN: 57 mL/min — AB (ref >60)
Glucose, Bld: 89 mg/dL (ref 65–99)
POTASSIUM: 3.4 mmol/L — AB (ref 3.5–5.1)
Sodium: 145 mmol/L (ref 135–145)

## 2015-03-18 LAB — GLUCOSE, CAPILLARY
GLUCOSE-CAPILLARY: 107 mg/dL — AB (ref 65–99)
Glucose-Capillary: 91 mg/dL (ref 65–99)
Glucose-Capillary: 86 mg/dL (ref 65–99)

## 2015-03-18 LAB — CBC
HCT: 37.1 % — ABNORMAL LOW (ref 39.0–52.0)
HEMOGLOBIN: 12.3 g/dL — AB (ref 13.0–17.0)
MCH: 27.2 pg (ref 26.0–34.0)
MCHC: 33.2 g/dL (ref 30.0–36.0)
MCV: 81.9 fL (ref 78.0–100.0)
PLATELETS: 196 10*3/uL (ref 150–400)
RBC: 4.53 MIL/uL (ref 4.22–5.81)
RDW: 15.6 % — ABNORMAL HIGH (ref 11.5–15.5)
WBC: 12.2 10*3/uL — ABNORMAL HIGH (ref 4.0–10.5)

## 2015-03-18 MED ORDER — POTASSIUM CHLORIDE CRYS ER 20 MEQ PO TBCR
40.0000 meq | EXTENDED_RELEASE_TABLET | Freq: Once | ORAL | Status: AC
  Administered 2015-03-18: 40 meq via ORAL
  Filled 2015-03-18: qty 2

## 2015-03-18 MED ORDER — ADULT MULTIVITAMIN W/MINERALS CH
1.0000 | ORAL_TABLET | Freq: Every day | ORAL | Status: DC
  Administered 2015-03-18 – 2015-03-19 (×2): 1 via ORAL
  Filled 2015-03-18 (×2): qty 1

## 2015-03-18 MED ORDER — ENSURE ENLIVE PO LIQD
237.0000 mL | Freq: Two times a day (BID) | ORAL | Status: DC
  Administered 2015-03-18 – 2015-03-19 (×3): 237 mL via ORAL
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
## 2015-03-18 NOTE — Care Management Note (Signed)
Case Management Note  Patient Details  Name: Jerry [name] MRN: [id number] Date of Birth: 10-01-1923  Subjective/Objective:                    Action/Plan: Ur updated   Expected Discharge Date:                  Expected Discharge Plan:  Skilled Nursing Facility  In-House Referral:  Clinical Social Work  Discharge planning Services     Post Acute Care Choice:    Choice offered to:     DME Arranged:    DME Agency:     HH Arranged:    HH Agency:     Status of Service:  In process, will continue to follow  Medicare Important Message Given:  Yes Date Medicare IM Given:  03/16/15 Medicare IM give by:  Ronny Flurry RN BSN  Date Additional Medicare IM Given:    Additional Medicare Important Message give by:     If discussed at Long Length of Stay Meetings, dates discussed:  03-18-15  Additional Comments:  Kingsley Plan, RN 03/18/2015, 8:27 AM

## 2015-03-18 NOTE — Progress Notes (Signed)
Initial Nutrition Assessment  DOCUMENTATION CODES:  Severe malnutrition in context of acute illness/injury  INTERVENTION:  -Ensure Enlive po BID, each supplement provides 350 kcal and 20 grams of protein -MVI daily  NUTRITION DIAGNOSIS:  Malnutrition related to acute illness as evidenced by moderate depletion of body fat, moderate depletions of muscle mass, percent weight loss.  GOAL:  Patient will meet greater than or equal to 90% of their needs  MONITOR:  PO intake, Supplement acceptance, Labs, Weight trends, I & O's, Skin  REASON FOR ASSESSMENT:  Low Braden    ASSESSMENT: Jerry [name] is an 81 y.o. male with chronic atrial fibrillation on Eliquis, previous stroke, history of traumatic subarachnoid hemorrhage, hypertension, and chronic kidney disease admitted to [facility] 03/11/2015 with increased confusion. An MRI performed today revealed a 3 mm acute white matter infarction of the right posterior frontal lobe at the vertex in the deep white matter as well as a late subacute infarction in the right temporal lobe with some residual internal areas of restricted diffusion.   Pt was lethargic, but arousable at time of visit. He reports he is feeling, better, however, does not respond to further questions and not family members at bedside to provide additional hx.   SLP is following; pt is currently on a dysphagia 2 diet with thin liquids. Staff report that pt is able to be aroused enough to take PO intake. Noted 25-50% meal completion.   Wt hx reveals acute wt loss of 8.9% over the past 3 month. UBW around 155#.   Nutrition-Focused physical exam completed. Findings are moderate fat depletion, moderate muscle depletion, and no edema.   CSW following for SNF placement.   Labs reviewed: K: 3.4.   Height:  Ht Readings from Last 1 Encounters:  03/11/15 5\' 9"  (1.753 m)    Weight:  Wt Readings from Last 1 Encounters:  03/11/15 143 lb 8.3 oz (65.1 kg)    Ideal  Body Weight:  72.7 kg  Wt Readings from Last 10 Encounters:  03/11/15 143 lb 8.3 oz (65.1 kg)  02/08/15 149 lb (67.586 kg)  12/12/14 157 lb 13.6 oz (71.6 kg)  09/15/13 156 lb 7 oz (70.96 kg)  06/18/13 172 lb (78.019 kg)  06/10/13 172 lb (78.019 kg)  04/08/13 150 lb (68.04 kg)  02/19/13 156 lb 12 oz (71.1 kg)    BMI:  Body mass index is 21.18 kg/(m^2).  Estimated Nutritional Needs:  Kcal:  1750-1950  Protein:  75-85 grams  Fluid:  1.7-1.9 L  Skin:  Reviewed, no issues (ecchymosis, skin tears to lt and rt arms)  Diet Order:  DIET DYS 2 Room service appropriate?: Yes; Fluid consistency:: Thin  EDUCATION NEEDS:  No education needs identified at this time   Intake/Output Summary (Last 24 hours) at 03/18/15 1019 Last data filed at 03/18/15 0606  Gross per 24 hour  Intake      0 ml  Output    200 ml  Net   -200 ml    Last BM:  03/17/15  Megon Kalina A. Mayford Knife, RD, LDN, CDE Pager: [phone] After hours Pager: [phone]
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
## 2015-03-18 NOTE — Progress Notes (Signed)
[facility]                                                                              Patient Demographics  Jerry [name], is a 81 y.o. male, DOB - 1923-10-13, ZOX:[id number]  Admit date - 03/11/2015   Admitting Physician Eduard Clos, MD  Outpatient Primary MD for the patient is [name], MD  LOS - 7   Chief Complaint  Patient presents with  . Altered Mental Status      HPI on 03/11/2015 by Dr. Midge Minium Jerry [name] is a 81 y.o. male with history of chronic atrial fibrillation on Apixaban, previous stroke, history of traumatic subarachnoid hemorrhage, hypertension, chronic kidney disease was brought to the ER to patient's family found the patient was increasingly confused today. Patient has been recently having frequent falls and was brought to the ER 2 weeks ago after patient sustained injury to his left hand and elbow. Patient had sutures placed on his finger and was discharged back to assisted living to study. Today patient's family found the patient was confused and was brought to the ER. Patient also has been having some jerking spells. Patient did not lose consciousness as per the family. CT of the head and neck shows fractures of the ribs and clavicle. On my exam patient is oriented to his name only. Lab work shows UTI. Patient is being admitted for further management.  Interim history Found to have Acute CVA, AMS from UTI.  Continues to have AMS, despite change in antibiotics.  EEG negative for seizure.  Neurology consulted.  Will need SNF when improved.  Neurology feels it make take a while for mental status to improve.  Assessment & Plan   Acute encephalopathy/Acute CVA -Possibly secondary to urinary tract infection vs CVA -Despite change in antibiotics, patient continues to have altered mental status- not at baseline -CT head: Chronic atrophic and ischemic changes -MRI brain: 3mm acute white matter infarction -Neurology consulted and  appreciated- feels the infarct may have been incidental, and rec continuing Eliquis.  -Spoke with Dr. Pearlean Brownie, feels that AMS and tremors is from more from UTI.  Ceftriaxone may also worsen AMS. -Hemoglobin A1c 5.9 -LDL 52 -Continue eliquis -Echocardiogram 12/14/2014: EF45-50% -PT/OT consulted,  recommended SNF -Speech recommended dysphagia 3 diet -Neurology started patient on Sinemet, however, discontinued as patient developed more jerk type movements on 6/19.    Myoclonic jerks due to encephalopathy -Patient has been having tremors, however, during PT evaluation on 03/16/2015, questionable seizure activity -EEG: Abnormal EEG secondary to general background slowing, findings consistent with diffuse disturbance, possibly metabolic encephalopathy. Multiple myoclonic episodes were captured, no elective form activity noted.  Urinary tract infection -Initially placed on ceftrixone- but discontinued -Urine cultures >100K eneterococcus species -Continue vancomycin (sensitivity to ampicillin, linezolid, vanc, nitrofurantoin)  Leukocytosis -Possible secondary to UTI vs reactive -Blood cultures pending -Currently afebrile -Continue to monitor CBC  Hypertension -Continue metoprolol, amlodipine  Frequent falls -PT consulted and recommended SNF -Patient has noted healing left 2nd rib fracture  Chronic atrial fibrillation -Continue eliqius and metoprolol -Would consider stopping anticoagulation given age and fall risk, discussed with daughter, however, she felt she could not make a decision and  would like to continue Eliquis at this time  Hyperlipidemia -Continue statin -Lipid panel: TC100, TG 78, HDL 32, LDL 52  Chronic kidney disease -Appears to be at baseline, continue to monitor BMP  History of traumatic subarachnoid hemorrhage  Hypokalemia -Will replace and continue to monitor BMP  Code Status: Full  Family Communication: None at bedside.   Disposition Plan: Admitted. Patient  continues to have confusion, however, more interactive today.Patient will need to be discharged to SNF when his mental status improves  Time Spent in minutes   30 minutes   Procedures  EEG  Consults   Neurology   DVT Prophylaxis  Eliquis  Lab Results  Component Value Date   PLT 196 03/18/2015    Medications  Scheduled Meds: . amLODipine  10 mg Oral Daily  . apixaban  5 mg Oral BID  . atorvastatin  40 mg Oral q1800  . feeding supplement (ENSURE ENLIVE)  237 mL Oral BID BM  . metoprolol  25 mg Oral Daily  . multivitamin with minerals  1 tablet Oral Daily  . vancomycin  750 mg Intravenous Q24H   Continuous Infusions: . sodium chloride 50 mL/hr at 03/17/15 1846   PRN Meds:.traMADol  Antibiotics    Anti-infectives    Start     Dose/Rate Route Frequency Ordered Stop   03/15/15 1400  vancomycin (VANCOCIN) IVPB 750 mg/150 ml premix     750 mg 150 mL/hr over 60 Minutes Intravenous Every 24 hours 03/15/15 1253     03/12/15 2130  cefTRIAXone (ROCEPHIN) 1 g in dextrose 5 % 50 mL IVPB - Premix  Status:  Discontinued     1 g 100 mL/hr over 30 Minutes Intravenous Every 24 hours 03/11/15 2236 03/15/15 1237   03/11/15 2045  cefTRIAXone (ROCEPHIN) 1 g in dextrose 5 % 50 mL IVPB     1 g 100 mL/hr over 30 Minutes Intravenous  Once 03/11/15 2041 03/12/15 0034        Subjective:   Jerry [name] seen and examined today.  More interactive today.  Has no complaints, states he is tired. Denies chest pain, shortness of breath, abdominal pain.   Objective:   Filed Vitals:   03/17/15 1041 03/17/15 1442 03/17/15 2239 03/18/15 0605  BP: 140/121 134/65 131/76 125/66  Pulse: 52 75 108 110  Temp: 98 F (36.7 C) 98.5 F (36.9 C) 98.6 F (37 C) 98.6 F (37 C)  TempSrc: Oral Oral Oral Oral  Resp: 18 18 17 18   Height:      Weight:      SpO2: 97% 98% 98% 99%    Wt Readings from Last 3 Encounters:  03/11/15 65.1 kg (143 lb 8.3 oz)  02/08/15 67.586 kg (149 lb)  12/12/14 71.6 kg (157  lb 13.6 oz)     Intake/Output Summary (Last 24 hours) at 03/18/15 1115 Last data filed at 03/18/15 0606  Gross per 24 hour  Intake      0 ml  Output    200 ml  Net   -200 ml    Exam  General: Well developed, elderly thin  HEENT: NCAT, mucous membranes moist.   Cardiovascular: S1 S2 auscultated, irregular  Respiratory: Clear to auscultation   Abdomen: Soft, nontender, nondistended, + bowel sounds  Extremities: warm dry without cyanosis clubbing or edema  Neuro: Awake and alert, oriented to self.  Follows commands and can answer simple questions.  Nonfocal.  Skin: Multiple bruises, abrasions, and scabs  Data Review   Micro Results  Recent Results (from the past 240 hour(s))  Urine culture     Status: None   Collection Time: 03/11/15  6:22 PM  Result Value Ref Range Status   Specimen Description URINE, CATHETERIZED  Final   Special Requests Immunocompromised  Final   Culture >=100,000 COLONIES/mL ENTEROCOCCUS SPECIES  Final   Report Status 03/16/2015 FINAL  Final   Organism ID, Bacteria ENTEROCOCCUS SPECIES  Final      Susceptibility   Enterococcus species - MIC*    AMPICILLIN <=2 SENSITIVE Sensitive     LEVOFLOXACIN >=8 RESISTANT Resistant     NITROFURANTOIN <=16 SENSITIVE Sensitive     VANCOMYCIN 1 SENSITIVE Sensitive     LINEZOLID 2 SENSITIVE Sensitive     * >=100,000 COLONIES/mL ENTEROCOCCUS SPECIES  Culture, blood (routine x 2)     Status: None (Preliminary result)   Collection Time: 03/17/15 12:52 PM  Result Value Ref Range Status   Specimen Description BLOOD RIGHT ANTECUBITAL  Final   Special Requests BOTTLES DRAWN AEROBIC AND ANAEROBIC  10CC EACH  Final   Culture PENDING  Incomplete   Report Status PENDING  Incomplete    Radiology Reports Dg Chest 1 View  03/11/2015   CLINICAL DATA:  Altered mental status and chest pain  EXAM: CHEST  1 VIEW  COMPARISON:  03/04/2015  FINDINGS: Cardiac shadow is mildly enlarged. The known bilateral rib fractures are not  well appreciated on this exam. The lungs are well-aerated without focal infiltrate or sizable effusion. Mild interstitial changes are again seen. Postsurgical changes in the right humerus are noted.  IMPRESSION: No acute abnormality noted.   Electronically Signed   By: Alcide Clever M.D.   On: 03/11/2015 20:01   Ct Head Wo Contrast  03/11/2015   CLINICAL DATA:  Multiple falls in last few weeks, initial encounter  EXAM: CT HEAD WITHOUT CONTRAST  CT CERVICAL SPINE WITHOUT CONTRAST  TECHNIQUE: Multidetector CT imaging of the head and cervical spine was performed following the standard protocol without intravenous contrast. Multiplanar CT image reconstructions of the cervical spine were also generated.  COMPARISON:  02/14/2015  FINDINGS: CT HEAD FINDINGS  The bony calvarium is intact. Diffuse atrophic and chronic white matter ischemic changes are seen. There are changes of prior infarcts identified within the right temporal lobe and extending superiorly into the right basal ganglia and centrum semi ovale. No findings to suggest acute hemorrhage, acute infarction or space-occupying mass lesion are noted.  CT CERVICAL SPINE FINDINGS  The examination is somewhat limited by patient motion artifact. Multilevel facet hypertrophic changes are seen. Vertebral body height is well maintained. Osteophytic changes are noted worst at the C5-6 and C6-7 level. Changes consistent with healing fracture of the left second rib posteriorly are seen. The visualized lung apices are within normal limits. The surrounding soft tissues show carotid calcifications without acute abnormality. Seen only on the sagittal reconstructions there is an undisplaced fracture of the left clavicular head.  IMPRESSION: CT of the head: Chronic atrophic and ischemic changes as well as findings of prior infarcts. These changes are stable from the prior study.  CT of the cervical spine:  Healing left second rib fracture.  Left clavicular head fracture without  significant displacement. No other focal acute bony abnormality is noted.  Multilevel degenerative change .  Multilevel degenerative change without acute abnormality.   Electronically Signed   By: Alcide Clever M.D.   On: 03/11/2015 20:17   Ct Chest Wo Contrast  03/04/2015   CLINICAL DATA:  History of multiple falls, evaluate for rib lesion, former smoking history  EXAM: CT CHEST WITHOUT CONTRAST  TECHNIQUE: Multidetector CT imaging of the chest was performed following the standard protocol without IV contrast.  COMPARISON:  Chest x-ray of 02/14/2015  FINDINGS: Calcified granulomas are present within the left upper lobe consistent with prior granulomatous disease. Also there is some calcification within the spleen consistent with a benign process. No active infiltrate is seen. No pleural effusion is noted. The central airway is patent.  On bone window images, there are nondisplaced fractures of the anterior right second, third and fifth ribs with some callus formation present. On the left, there are fractures the anterior left third, fourth, fifth, and sixth ribs with some callus formation. There is a thoracic kyphosis present in the bones appear diffusely osteopenic. Very minimal compression of the superior endplate of T8 is present. The sternum appears intact.  On soft tissue window images, the thyroid gland is unremarkable. Atheromatous change is noted involving the aortic arch and descending thoracic aorta. No mediastinal or hilar adenopathy is seen. Coronary artery calcifications are present primarily within the left anterior descending artery and cardiomegaly is noted. There does appear to be a small hiatal hernia present.  IMPRESSION: 1. Subacute fractures of the anterior right second, third, and fifth ribs with subacute fractures of the anterior left third, fourth, fifth, and sixth ribs with some callus formation present. Diffuse osteopenia. 2. Changes of prior granulomatous these disease with granulomas in  the left upper lobe. 3. Thoracic kyphosis. 4. Calcification of the left anterior descending coronary artery. 5. Small hiatal hernia.   Electronically Signed   By: Dwyane Dee M.D.   On: 03/04/2015 11:44   Ct Cervical Spine Wo Contrast  03/11/2015   CLINICAL DATA:  Multiple falls in last few weeks, initial encounter  EXAM: CT HEAD WITHOUT CONTRAST  CT CERVICAL SPINE WITHOUT CONTRAST  TECHNIQUE: Multidetector CT imaging of the head and cervical spine was performed following the standard protocol without intravenous contrast. Multiplanar CT image reconstructions of the cervical spine were also generated.  COMPARISON:  02/14/2015  FINDINGS: CT HEAD FINDINGS  The bony calvarium is intact. Diffuse atrophic and chronic white matter ischemic changes are seen. There are changes of prior infarcts identified within the right temporal lobe and extending superiorly into the right basal ganglia and centrum semi ovale. No findings to suggest acute hemorrhage, acute infarction or space-occupying mass lesion are noted.  CT CERVICAL SPINE FINDINGS  The examination is somewhat limited by patient motion artifact. Multilevel facet hypertrophic changes are seen. Vertebral body height is well maintained. Osteophytic changes are noted worst at the C5-6 and C6-7 level. Changes consistent with healing fracture of the left second rib posteriorly are seen. The visualized lung apices are within normal limits. The surrounding soft tissues show carotid calcifications without acute abnormality. Seen only on the sagittal reconstructions there is an undisplaced fracture of the left clavicular head.  IMPRESSION: CT of the head: Chronic atrophic and ischemic changes as well as findings of prior infarcts. These changes are stable from the prior study.  CT of the cervical spine:  Healing left second rib fracture.  Left clavicular head fracture without significant displacement. No other focal acute bony abnormality is noted.  Multilevel degenerative  change .  Multilevel degenerative change without acute abnormality.   Electronically Signed   By: Alcide Clever M.D.   On: 03/11/2015 20:17   Mr Brain Wo Contrast  03/12/2015   CLINICAL DATA:  Acute encephalopathy. Worsening confusion acutely. Recent falls.  EXAM: MRI HEAD WITHOUT CONTRAST  TECHNIQUE: Multiplanar, multiecho pulse sequences of the brain and surrounding structures were obtained without intravenous contrast.  COMPARISON:  Head CT 03/11/2015.  MRI 12/12/2014.  FINDINGS: Diffusion imaging shows a 3 mm acute infarction in the deep white matter of the right posterior frontal region near the vertex. There are minimal chronic small-vessel changes of the brainstem. There are a few old small vessel cerebellar infarctions. There is cerebellar atrophy. Cerebral hemispheres show late subacute infarction in the right basal ganglia. There are a few areas of restricted diffusion internally in that area, not felt to represent true extensions or new infarctions. No acute cerebral hemispheric infarction is seen. The brain shows advanced generalized atrophy with chronic small-vessel ischemic changes throughout the white matter. There is chronic atrophy and gliosis of the right temporal lobe and in the left frontal lobe. Hemosiderin is seen along the surface of the brain and in the left frontal and right temporal lobe consistent with previous intracranial hemorrhage. No sign of acute hemorrhage. No evidence of neoplastic mass lesion, obstructive hydrocephalus or extra-axial hematoma. There are then subdural hygromas, unchanged and without mass effect. No pituitary mass. No inflammatory sinus disease. There is a small amount of fluid in the mastoid air cells.  IMPRESSION: 3 mm acute white matter infarction of the right posterior frontal lobe at the vertex in the deep white matter.  Late subacute infarction in the right temporal lobe with some residual internal areas of restricted diffusion.  Extensive atrophic and  ischemic changes of a chronic nature elsewhere throughout the brain. 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 Thin subdural hygromas without mass effect.   Electronically Signed   By: Paulina Fusi M.D.   On: 03/12/2015 08:38    CBC  Recent Labs Lab 03/11/15 1905 03/12/15 0328  03/13/15 0415 03/14/15 0415 03/16/15 0545 03/17/15 0940 03/18/15 0305  WBC 8.9 6.9  < > 6.6 8.5 9.2 12.2* 12.2*  HGB 12.2* 11.4*  < > 12.1* 12.4* 13.2 12.0* 12.3*  HCT 36.3* 34.0*  < > 35.9* 36.7* 40.1 35.9* 37.1*  PLT 175 172  < > 187 189 203 208 196  MCV 81.2 81.0  < > 80.0 80.5 82.3 80.9 81.9  MCH 27.3 27.1  < > 26.9 27.2 27.1 27.0 27.2  MCHC 33.6 33.5  < > 33.7 33.8 32.9 33.4 33.2  RDW 14.5 14.5  < > 14.4 14.6 15.0 15.1 15.6*  LYMPHSABS 1.7 1.9  --   --   --   --   --   --   MONOABS 1.7* 1.2*  --   --   --   --   --   --   EOSABS 0.2 0.1  --   --   --   --   --   --   BASOSABS 0.1 0.1  --   --   --   --   --   --   < > = values in this interval not displayed.  Chemistries   Recent Labs Lab 03/11/15 1905 03/12/15 0328 03/14/15 0415 03/16/15 0545 03/17/15 0940 03/18/15 0305  NA 136 138 140 141 143 145  K 4.7 4.1 4.1 3.8 3.6 3.4*  CL 105 107 109 110 112* 115*  CO2 22 23 20* 19* 23 22  GLUCOSE 106* 95 95 112* 100* 89  BUN 15 14 10 10 10 8   CREATININE 1.46* 1.35* 1.20 1.13 1.18  1.11  CALCIUM 8.9 8.5* 8.7* 8.6* 8.7* 8.5*  AST 22 18  --   --   --   --   ALT 15* 13*  --   --   --   --   ALKPHOS 91 86  --   --   --   --   BILITOT 0.8 0.8  --   --   --   --    ------------------------------------------------------------------------------------------------------------------ estimated creatinine clearance is 42.4 mL/min (by C-G formula based on Cr of 1.11). ------------------------------------------------------------------------------------------------------------------ No results for input(s): HGBA1C in the last 72  hours. ------------------------------------------------------------------------------------------------------------------ No results for input(s): CHOL, HDL, LDLCALC, TRIG, CHOLHDL, LDLDIRECT in the last 72 hours. ------------------------------------------------------------------------------------------------------------------ No results for input(s): TSH, T4TOTAL, T3FREE, THYROIDAB in the last 72 hours.  Invalid input(s): FREET3 ------------------------------------------------------------------------------------------------------------------ No results for input(s): VITAMINB12, FOLATE, FERRITIN, TIBC, IRON, RETICCTPCT in the last 72 hours.  Coagulation profile No results for input(s): INR, PROTIME in the last 168 hours.  No results for input(s): DDIMER in the last 72 hours.  Cardiac Enzymes No results for input(s): CKMB, TROPONINI, MYOGLOBIN in the last 168 hours.  Invalid input(s): CK ------------------------------------------------------------------------------------------------------------------ Invalid input(s): POCBNP    Fard Borunda D.O. on 03/18/2015 at 11:15 AM  Between 7am to 7pm - Pager - [phone]  After 7pm go to [email or web address] - password [email or web address]  And look for the night coverage person covering for me after hours  [facility] Office  [phone]

## 2015-03-19 DIAGNOSIS — G9340 Encephalopathy, unspecified: Secondary | ICD-10-CM

## 2015-03-19 LAB — BASIC METABOLIC PANEL
Anion gap: 11 (ref 5–15)
BUN: 12 mg/dL (ref 6–20)
CO2: 21 mmol/L — AB (ref 22–32)
CREATININE: 1.14 mg/dL (ref 0.61–1.24)
Calcium: 8.7 mg/dL — ABNORMAL LOW (ref 8.9–10.3)
Chloride: 113 mmol/L — ABNORMAL HIGH (ref 101–111)
GFR calc Af Amer: >60 mL/min (ref >60)
GFR, EST NON AFRICAN AMERICAN: 55 mL/min — AB (ref >60)
GLUCOSE: 115 mg/dL — AB (ref 65–99)
Potassium: 3.8 mmol/L (ref 3.5–5.1)
SODIUM: 145 mmol/L (ref 135–145)

## 2015-03-19 LAB — GLUCOSE, CAPILLARY
GLUCOSE-CAPILLARY: 97 mg/dL (ref 65–99)
Glucose-Capillary: 121 mg/dL — ABNORMAL HIGH (ref 65–99)

## 2015-03-19 LAB — CBC
HEMATOCRIT: 38.5 % — AB (ref 39.0–52.0)
Hemoglobin: 12.9 g/dL — ABNORMAL LOW (ref 13.0–17.0)
MCH: 27.2 pg (ref 26.0–34.0)
MCHC: 33.5 g/dL (ref 30.0–36.0)
MCV: 81.1 fL (ref 78.0–100.0)
Platelets: 206 10*3/uL (ref 150–400)
RBC: 4.75 MIL/uL (ref 4.22–5.81)
RDW: 15.6 % — AB (ref 11.5–15.5)
WBC: 12.4 10*3/uL — ABNORMAL HIGH (ref 4.0–10.5)

## 2015-03-19 NOTE — Clinical Social Work Placement (Signed)
   CLINICAL SOCIAL WORK PLACEMENT  NOTE  Date:  03/19/2015  Patient Details  Name: Jerry [name] MRN: [id number] Date of Birth: 18-Jun-1924  Clinical Social Work is seeking post-discharge placement for this patient at the Skilled  Nursing Facility level of care (*CSW will initial, date and re-position this form in  chart as items are completed):  Yes   Patient/family provided with [place] Clinical Social Work Department's list of facilities offering this level of care within the geographic area requested by the patient (or if unable, by the patient's family).  Yes   Patient/family informed of their freedom to choose among providers that offer the needed level of care, that participate in Medicare, Medicaid or managed care program needed by the patient, have an available bed and are willing to accept the patient.  Yes   Patient/family informed of [place]'s ownership interest in [facility] and [facility], as well as of the fact that they are under no obligation to receive care at these facilities.  PASRR submitted to EDS on 03/17/15     PASRR number received on       Existing PASRR number confirmed on 03/17/15     FL2 transmitted to all facilities in geographic area requested by pt/family on       FL2 transmitted to all facilities within larger geographic area on       Patient informed that his/her managed care company has contracts with or will negotiate with certain facilities, including the following:        Yes   Patient/family informed of bed offers received.  Patient chooses bed at [facility] [place]     Physician recommends and patient chooses bed at      Patient to be transferred to [facility] on 03/19/15.  Patient to be transferred to facility by [facility]     Patient family notified on 03/19/15 of transfer.  Name of family member notified:  Okey Regal patient's daughter      PHYSICIAN       Additional Comment:    _______________________________________________ Darleene Cleaver, LCSWA 03/19/2015, 2:10 PM

## 2015-03-19 NOTE — Clinical Social Work Note (Signed)
Patient to be d/c'ed today to [facility].  Patient and family agreeable to plans will transport via ems RN to call report.  CSW spoke to patient's daughter Okey Regal who is aware patient will be discharging today.  CSW also contacted [facility] at [phone], and spoke to Tammy to inform her that patient will be discharging to SNF per family request.   Windell Moulding, MSW, Theresia Majors [phone]
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
## 2015-03-19 NOTE — Discharge Instructions (Signed)

## 2015-03-22 ENCOUNTER — Non-Acute Institutional Stay (SKILLED_NURSING_FACILITY): Payer: Medicare Other | Admitting: Adult Health

## 2015-03-22 DIAGNOSIS — I482 Chronic atrial fibrillation, unspecified: Secondary | ICD-10-CM

## 2015-03-22 DIAGNOSIS — I6340 Cerebral infarction due to embolism of unspecified cerebral artery: Secondary | ICD-10-CM

## 2015-03-22 DIAGNOSIS — E785 Hyperlipidemia, unspecified: Secondary | ICD-10-CM

## 2015-03-22 DIAGNOSIS — N3000 Acute cystitis without hematuria: Secondary | ICD-10-CM

## 2015-03-22 DIAGNOSIS — G9340 Encephalopathy, unspecified: Secondary | ICD-10-CM

## 2015-03-22 DIAGNOSIS — I10 Essential (primary) hypertension: Secondary | ICD-10-CM

## 2015-03-22 LAB — CULTURE, BLOOD (ROUTINE X 2)
CULTURE: NO GROWTH
Culture: NO GROWTH

## 2015-03-23 ENCOUNTER — Non-Acute Institutional Stay (SKILLED_NURSING_FACILITY): Payer: Medicare Other | Admitting: Internal Medicine

## 2015-03-23 ENCOUNTER — Encounter: Payer: Self-pay | Admitting: Internal Medicine

## 2015-03-23 ENCOUNTER — Encounter: Payer: Self-pay | Admitting: Adult Health

## 2015-03-23 DIAGNOSIS — I482 Chronic atrial fibrillation, unspecified: Secondary | ICD-10-CM

## 2015-03-23 DIAGNOSIS — E785 Hyperlipidemia, unspecified: Secondary | ICD-10-CM

## 2015-03-23 DIAGNOSIS — N390 Urinary tract infection, site not specified: Secondary | ICD-10-CM

## 2015-03-23 DIAGNOSIS — I10 Essential (primary) hypertension: Secondary | ICD-10-CM

## 2015-03-23 DIAGNOSIS — G9340 Encephalopathy, unspecified: Secondary | ICD-10-CM

## 2015-03-23 DIAGNOSIS — I6340 Cerebral infarction due to embolism of unspecified cerebral artery: Secondary | ICD-10-CM | POA: Diagnosis not present

## 2015-03-23 NOTE — Progress Notes (Signed)
Patient ID: Jerry [name], male   DOB: September 20, 1924, 81 y.o.   MRN: [id number]    HISTORY AND PHYSICAL   DATE: 03/23/15  Location:  [facility]    Place of Service: SNF [id number])   Extended Emergency Contact Information Primary Emergency Contact: Jeffords,[name] Address: [place], [place] [address] Johnnette Litter of [place] Phone: [phone] Relation: Son Secondary Emergency Contact: [facility] Address: [address]          [place] [address] Johnnette Litter of [place] Phone: [phone] Relation: Son  Advanced Directive information  FULL CODE  Chief Complaint  Patient presents with  . New Admit To SNF    HPI:  81 yo male seen today as a new admission into SNF following hospital stay for acute encephalopathy due to acute embolic CVA  and UTI, chronic afib on eliquis, severe protein calorie malnutrition, myoclonic jerks and CKD. MRI brain revealed 15mm acute white matter infarction. Eliquis continued. 2decho EF 45-50%. A1c 5.9%. LDL 52. EEG was abnormal (general background slowing) and c/w diffuse disturbance but no seizure activity. Multiple myoclonic episodes seen on EEG. Urine cx >100k colonies of Enterococcus. He was tx with IV vanco x 5 days. His change in MS persisted with tx of UTI and it was recommended that he have MMSE for eval of  Dementia  He has no concerns today. No nursing issues. No falls. He is a poor historian due to encephalopathy. Hx obtained from chart  HTN - BP stable on amlodipine and metoprolol  afib - rate controlled on BB. Takes eliquis for anticoagulation  Hyperlipidemia - stable on statin  Arthritis - pain controlled on tramadol  Past Medical History  Diagnosis Date  . Hypertension   . Arthritis   . Hernia   . Dysrhythmia     atrial fibrillation  . Head injury   . Humerus fracture     right  . Asthma     as a child  . Pneumonia   . Stroke     "light stroke"  . H/O hiatal hernia    . Stroke 2016  . A-fib   . Back pain     Past Surgical History  Procedure Laterality Date  . Finger amputation Left     little  . Orif humerus fracture Right 09/15/2013    Procedure: OPEN REDUCTION INTERNAL FIXATION (ORIF) RIGHT HUMERAL SHAFT FRACTURE;  Surgeon: Marianna Payment, MD;  Location: [facility];  Service: Orthopedics;  Laterality: Right;    Patient Care Team: Leonard Downing, MD as PCP - General (Family Medicine)  History   Social History  . Marital Status: Widowed    Spouse Name: N/A  . Number of Children: 5  . Years of Education: 5   Occupational History  . retired     Games developer, Architect   Social History Main Topics  . Smoking status: Never Smoker   . Smokeless tobacco: Former Systems developer    Types: Chew  . Alcohol Use: Yes     Comment: heavy drinker  . Drug Use: Not on file  . Sexual Activity: Not on file   Other Topics Concern  . Not on file   Social History Narrative   02/08/15 currently living at [facility], lived with son, widowed      Right handed      Caffeine use- yes, but unsure of amount per day  reports that he has never smoked. He has quit using smokeless tobacco. His smokeless tobacco use included Chew. He reports that he drinks alcohol. His drug history is not on file.  Family History  Problem Relation Age of Onset  . Heart attack Brother    No family status information on file.     There is no immunization history on file for this patient.  Allergies  Allergen Reactions  . Hydrocodone Other (See Comments)    Reaction unknown  . Penicillins Other (See Comments)    REACTION: unknown  . Tetanus Toxoids Other (See Comments)    unknown    Medications: Patient's Medications  New Prescriptions   No medications on file  Previous Medications   AMLODIPINE (NORVASC) 10 MG TABLET    Take 10 mg by mouth daily.   APIXABAN (ELIQUIS) 5 MG TABS TABLET    Take 1 tablet (5 mg total) by mouth 2 (two) times  daily.   ATORVASTATIN (LIPITOR) 40 MG TABLET    Take 1 tablet (40 mg total) by mouth daily at 6 PM.   METOPROLOL (LOPRESSOR) 50 MG TABLET    Take 0.5 tablets (25 mg total) by mouth daily.   TRAMADOL (ULTRAM) 50 MG TABLET    Take 1 tablet (50 mg total) by mouth every 6 (six) hours as needed.   VITAMIN B-12 1000 MCG TABLET    Take 1 tablet (1,000 mcg total) by mouth daily.  Modified Medications   No medications on file  Discontinued Medications   No medications on file    Review of Systems  Unable to perform ROS: Dementia    Filed Vitals:   03/23/15 1830  BP: 120/74  Pulse: 89  Temp: 97.4 F (36.3 C)  Weight: 151 lb (68.493 kg)   Body mass index is 23.64 kg/(m^2).  Physical Exam  Constitutional: He appears well-developed.  Sitting in w/c in NAD. Frail appearing  HENT:  Mouth/Throat: Oropharynx is clear and moist.  Eyes: Pupils are equal, round, and reactive to light. No scleral icterus.  Neck: Neck supple. Carotid bruit is not present. No thyromegaly present.  Cardiovascular: Normal rate and intact distal pulses.  An irregularly irregular rhythm present. Exam reveals no gallop and no friction rub.   Murmur heard.  Systolic murmur is present with a grade of 1/6  + 1 pitting LE edema b/l. No calf TTP  Pulmonary/Chest: Effort normal and breath sounds normal. He has no wheezes. He has no rales. He exhibits no tenderness.  Abdominal: Soft. Bowel sounds are normal. He exhibits no distension, no abdominal bruit, no pulsatile midline mass and no mass. There is no tenderness. There is no rebound and no guarding.  Musculoskeletal: He exhibits edema.  Multiple finger amputations  Lymphadenopathy:    He has no cervical adenopathy.  Neurological: He is alert.  Skin: Skin is warm and dry. Abrasion (left dorsal hand - no secondary signs of infection) noted. No rash noted.  Psychiatric: He has a normal mood and affect. His behavior is normal.     Labs reviewed: Admission on 03/11/2015,  Discharged on 03/19/2015  No results displayed because visit has over 200 results.    Admission on 02/14/2015, Discharged on 02/15/2015  Component Date Value Ref Range Status  . WBC 02/14/2015 11.0* 4.0 - 10.5 K/uL Final  . RBC 02/14/2015 5.01  4.22 - 5.81 MIL/uL Final  . Hemoglobin 02/14/2015 13.8  13.0 - 17.0 g/dL Final  . HCT 02/14/2015 41.1  39.0 - 52.0 %  Final  . MCV 02/14/2015 82.0  78.0 - 100.0 fL Final  . MCH 02/14/2015 27.5  26.0 - 34.0 pg Final  . MCHC 02/14/2015 33.6  30.0 - 36.0 g/dL Final  . RDW 02/14/2015 13.5  11.5 - 15.5 % Final  . Platelets 02/14/2015 134* 150 - 400 K/uL Final  . Neutrophils Relative % 02/14/2015 73  43 - 77 % Final  . Neutro Abs 02/14/2015 8.0* 1.7 - 7.7 K/uL Final  . Lymphocytes Relative 02/14/2015 13  12 - 46 % Final  . Lymphs Abs 02/14/2015 1.4  0.7 - 4.0 K/uL Final  . Monocytes Relative 02/14/2015 13* 3 - 12 % Final  . Monocytes Absolute 02/14/2015 1.5* 0.1 - 1.0 K/uL Final  . Eosinophils Relative 02/14/2015 1  0 - 5 % Final  . Eosinophils Absolute 02/14/2015 0.1  0.0 - 0.7 K/uL Final  . Basophils Relative 02/14/2015 0  0 - 1 % Final  . Basophils Absolute 02/14/2015 0.0  0.0 - 0.1 K/uL Final  . Sodium 02/14/2015 133* 135 - 145 mmol/L Final  . Potassium 02/14/2015 3.6  3.5 - 5.1 mmol/L Final  . Chloride 02/14/2015 100* 101 - 111 mmol/L Final  . CO2 02/14/2015 25  22 - 32 mmol/L Final  . Glucose, Bld 02/14/2015 103* 65 - 99 mg/dL Final  . BUN 02/14/2015 17  6 - 20 mg/dL Final  . Creatinine, Ser 02/14/2015 1.24  0.61 - 1.24 mg/dL Final  . Calcium 02/14/2015 8.7* 8.9 - 10.3 mg/dL Final  . GFR calc non Af Amer 02/14/2015 50* >60 mL/min Final  . GFR calc Af Amer 02/14/2015 58* >60 mL/min Final   Comment: (NOTE) The eGFR has been calculated using the CKD EPI equation. This calculation has not been validated in all clinical situations. eGFR's persistently <60 mL/min signify possible Chronic Kidney Disease.   . Anion gap 02/14/2015 8  5 - 15 Final   . Prothrombin Time 02/14/2015 20.2* 11.6 - 15.2 seconds Final  . INR 02/14/2015 1.72* 0.00 - 1.49 Final  . Troponin I 02/14/2015 <0.03  <0.031 ng/mL Final   Comment:        NO INDICATION OF MYOCARDIAL INJURY.     Dg Chest 1 View  03/11/2015   CLINICAL DATA:  Altered mental status and chest pain  EXAM: CHEST  1 VIEW  COMPARISON:  03/04/2015  FINDINGS: Cardiac shadow is mildly enlarged. The known bilateral rib fractures are not well appreciated on this exam. The lungs are well-aerated without focal infiltrate or sizable effusion. Mild interstitial changes are again seen. Postsurgical changes in the right humerus are noted.  IMPRESSION: No acute abnormality noted.   Electronically Signed   By: Inez Catalina M.D.   On: 03/11/2015 20:01   Ct Head Wo Contrast  03/11/2015   CLINICAL DATA:  Multiple falls in last few weeks, initial encounter  EXAM: CT HEAD WITHOUT CONTRAST  CT CERVICAL SPINE WITHOUT CONTRAST  TECHNIQUE: Multidetector CT imaging of the head and cervical spine was performed following the standard protocol without intravenous contrast. Multiplanar CT image reconstructions of the cervical spine were also generated.  COMPARISON:  02/14/2015  FINDINGS: CT HEAD FINDINGS  The bony calvarium is intact. Diffuse atrophic and chronic white matter ischemic changes are seen. There are changes of prior infarcts identified within the right temporal lobe and extending superiorly into the right basal ganglia and centrum semi ovale. No findings to suggest acute hemorrhage, acute infarction or space-occupying mass lesion are noted.  CT CERVICAL SPINE  FINDINGS  The examination is somewhat limited by patient motion artifact. Multilevel facet hypertrophic changes are seen. Vertebral body height is well maintained. Osteophytic changes are noted worst at the C5-6 and C6-7 level. Changes consistent with healing fracture of the left second rib posteriorly are seen. The visualized lung apices are within normal limits.  The surrounding soft tissues show carotid calcifications without acute abnormality. Seen only on the sagittal reconstructions there is an undisplaced fracture of the left clavicular head.  IMPRESSION: CT of the head: Chronic atrophic and ischemic changes as well as findings of prior infarcts. These changes are stable from the prior study.  CT of the cervical spine:  Healing left second rib fracture.  Left clavicular head fracture without significant displacement. No other focal acute bony abnormality is noted.  Multilevel degenerative change .  Multilevel degenerative change without acute abnormality.   Electronically Signed   By: Inez Catalina M.D.   On: 03/11/2015 20:17   Ct Chest Wo Contrast  03/04/2015   CLINICAL DATA:  History of multiple falls, evaluate for rib lesion, former smoking history  EXAM: CT CHEST WITHOUT CONTRAST  TECHNIQUE: Multidetector CT imaging of the chest was performed following the standard protocol without IV contrast.  COMPARISON:  Chest x-ray of 02/14/2015  FINDINGS: Calcified granulomas are present within the left upper lobe consistent with prior granulomatous disease. Also there is some calcification within the spleen consistent with a benign process. No active infiltrate is seen. No pleural effusion is noted. The central airway is patent.  On bone window images, there are nondisplaced fractures of the anterior right second, third and fifth ribs with some callus formation present. On the left, there are fractures the anterior left third, fourth, fifth, and sixth ribs with some callus formation. There is a thoracic kyphosis present in the bones appear diffusely osteopenic. Very minimal compression of the superior endplate of T8 is present. The sternum appears intact.  On soft tissue window images, the thyroid gland is unremarkable. Atheromatous change is noted involving the aortic arch and descending thoracic aorta. No mediastinal or hilar adenopathy is seen. Coronary artery  calcifications are present primarily within the left anterior descending artery and cardiomegaly is noted. There does appear to be a small hiatal hernia present.  IMPRESSION: 1. Subacute fractures of the anterior right second, third, and fifth ribs with subacute fractures of the anterior left third, fourth, fifth, and sixth ribs with some callus formation present. Diffuse osteopenia. 2. Changes of prior granulomatous these disease with granulomas in the left upper lobe. 3. Thoracic kyphosis. 4. Calcification of the left anterior descending coronary artery. 5. Small hiatal hernia.   Electronically Signed   By: Ivar Drape M.D.   On: 03/04/2015 11:44   Ct Cervical Spine Wo Contrast  03/11/2015   CLINICAL DATA:  Multiple falls in last few weeks, initial encounter  EXAM: CT HEAD WITHOUT CONTRAST  CT CERVICAL SPINE WITHOUT CONTRAST  TECHNIQUE: Multidetector CT imaging of the head and cervical spine was performed following the standard protocol without intravenous contrast. Multiplanar CT image reconstructions of the cervical spine were also generated.  COMPARISON:  02/14/2015  FINDINGS: CT HEAD FINDINGS  The bony calvarium is intact. Diffuse atrophic and chronic white matter ischemic changes are seen. There are changes of prior infarcts identified within the right temporal lobe and extending superiorly into the right basal ganglia and centrum semi ovale. No findings to suggest acute hemorrhage, acute infarction or space-occupying mass lesion are noted.  CT CERVICAL SPINE  FINDINGS  The examination is somewhat limited by patient motion artifact. Multilevel facet hypertrophic changes are seen. Vertebral body height is well maintained. Osteophytic changes are noted worst at the C5-6 and C6-7 level. Changes consistent with healing fracture of the left second rib posteriorly are seen. The visualized lung apices are within normal limits. The surrounding soft tissues show carotid calcifications without acute abnormality. Seen  only on the sagittal reconstructions there is an undisplaced fracture of the left clavicular head.  IMPRESSION: CT of the head: Chronic atrophic and ischemic changes as well as findings of prior infarcts. These changes are stable from the prior study.  CT of the cervical spine:  Healing left second rib fracture.  Left clavicular head fracture without significant displacement. No other focal acute bony abnormality is noted.  Multilevel degenerative change .  Multilevel degenerative change without acute abnormality.   Electronically Signed   By: Inez Catalina M.D.   On: 03/11/2015 20:17   Mr Brain Wo Contrast  03/12/2015   CLINICAL DATA:  Acute encephalopathy. Worsening confusion acutely. Recent falls.  EXAM: MRI HEAD WITHOUT CONTRAST  TECHNIQUE: Multiplanar, multiecho pulse sequences of the brain and surrounding structures were obtained without intravenous contrast.  COMPARISON:  Head CT 03/11/2015.  MRI 12/12/2014.  FINDINGS: Diffusion imaging shows a 3 mm acute infarction in the deep white matter of the right posterior frontal region near the vertex. There are minimal chronic small-vessel changes of the brainstem. There are a few old small vessel cerebellar infarctions. There is cerebellar atrophy. Cerebral hemispheres show late subacute infarction in the right basal ganglia. There are a few areas of restricted diffusion internally in that area, not felt to represent true extensions or new infarctions. No acute cerebral hemispheric infarction is seen. The brain shows advanced generalized atrophy with chronic small-vessel ischemic changes throughout the white matter. There is chronic atrophy and gliosis of the right temporal lobe and in the left frontal lobe. Hemosiderin is seen along the surface of the brain and in the left frontal and right temporal lobe consistent with previous intracranial hemorrhage. No sign of acute hemorrhage. No evidence of neoplastic mass lesion, obstructive hydrocephalus or extra-axial  hematoma. There are then subdural hygromas, unchanged and without mass effect. No pituitary mass. No inflammatory sinus disease. There is a small amount of fluid in the mastoid air cells.  IMPRESSION: 3 mm acute white matter infarction of the right posterior frontal lobe at the vertex in the deep white matter.  Late subacute infarction in the right temporal lobe with some residual internal areas of restricted diffusion.  Extensive atrophic and ischemic changes of a chronic nature elsewhere throughout the brain. 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 Thin subdural hygromas without mass effect.   Electronically Signed   By: Nelson Chimes M.D.   On: 03/12/2015 08:38     Assessment/Plan   ICD-9-CM ICD-10-CM   1. Cerebral infarction due to embolism of cerebral artery (HCC)  434.11 I63.40   2. Encephalopathy due to #1 and 6 348.30 G93.40   3. Chronic atrial fibrillation - rate controlled 427.31 I48.2   4. Essential hypertension - stable 401.9 I10   5. Hyperlipidemia - stable 272.4 E78.5   6. Urinary tract infection without hematuria, site unspecified - resolving 599.0 N39.0     --check CBC w diff in 1 week  --check MMSE/geriatric depression screen  --cont current meds as ordered  --GOAL: short term rehab and d/c home when medically appropriate. Communicated with pt and  nursing.  --will follow  Delio Slates S. Perlie Gold  [facility] [address] [place], [place] [address] [phone] Cell (Monday-Friday 8 AM - 5 PM) [phone] After 5 PM and follow prompts

## 2015-03-23 NOTE — Progress Notes (Signed)
Patient ID: Jerry [name], male   DOB: 1924/09/19, 81 y.o.   MRN: [id number]  [name] living [place]     Allergies  Allergen Reactions  . Hydrocodone Other (See Comments)    Reaction unknown  . Penicillins Other (See Comments)    REACTION: unknown  . Tetanus Toxoids Other (See Comments)    unknown       Chief Complaint  Patient presents with  . Hospitalization Follow-up    HPI:  He has been hospitalized due to having increased confusion and falls.he was treated for acute cva; acute encephalopathy; and pyelonephritis. He is here for short term rehab with the goal at this time for him to return to his previous living environment. I am not certain at this time if this will be possible.     Past Medical History  Diagnosis Date  . Hypertension   . Arthritis   . Hernia   . Dysrhythmia     atrial fibrillation  . Head injury   . Humerus fracture     right  . Asthma     as a child  . Pneumonia   . Stroke     "light stroke"  . H/O hiatal hernia   . Stroke 2016  . A-fib   . Back pain     Past Surgical History  Procedure Laterality Date  . Finger amputation Left     little  . Orif humerus fracture Right 09/15/2013    Procedure: OPEN REDUCTION INTERNAL FIXATION (ORIF) RIGHT HUMERAL SHAFT FRACTURE;  Surgeon: Cheral Almas, MD;  Location: [facility];  Service: Orthopedics;  Laterality: Right;    VITAL SIGNS BP 149/78 mmHg  Pulse 88  Ht  (1.702 m)  Wt 149 lb (67.586 kg)  BMI 23.33 kg/m2   Outpatient Encounter Prescriptions as of 03/22/2015  Medication Sig  . amLODipine (NORVASC) 10 MG tablet Take 10 mg by mouth daily.  [name] apixaban (ELIQUIS) 5 MG TABS tablet Take 1 tablet (5 mg total) by mouth 2 (two) times daily.  [name] atorvastatin (LIPITOR) 40 MG tablet Take 1 tablet (40 mg total) by mouth daily at 6 PM.  . metoprolol (LOPRESSOR) 50 MG tablet Take 0.5 tablets (25 mg total) by mouth daily.  . traMADol (ULTRAM) 50 MG tablet Take 1 tablet (50 mg total) by mouth  every 6 (six) hours as needed. (Patient taking differently: Take 50 mg by mouth 3 (three) times daily. )  . vitamin B-12 1000 MCG tablet Take 1 tablet (1,000 mcg total) by mouth daily.      SIGNIFICANT DIAGNOSTIC EXAMS  03-04-15: ct of chest: 1. Subacute fractures of the anterior right second, third, and fifth ribs with subacute fractures of the anterior left third, fourth, fifth, and sixth ribs with some callus formation present. Diffuse osteopenia. 2. Changes of prior granulomatous these disease with granulomas in the left upper lobe. 3. Thoracic kyphosis. 4. Calcification of the left anterior descending coronary artery. 5. Small hiatal hernia.  03-11-15: chest x-ray: No acute abnormality noted.  03-11-15: ct of head and cervical spine: CT of the head: Chronic atrophic and ischemic changes as well as findings of prior infarcts. These changes are stable from the prior study. CT of the cervical spine:  Healing left second rib fracture. Left clavicular head fracture without significant displacement. No other focal acute bony abnormality is noted. Multilevel degenerative change . Multilevel degenerative change without acute abnormality.  03-12-15: mri of brain: 3 mm acute white matter infarction of the  right posterior frontal lobe at the vertex in the deep white matter. Late subacute infarction in the right temporal lobe with some residual internal areas of restricted diffusion. Extensive atrophic and ischemic changes of a chronic nature elsewhere throughout the brain. 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 Thin subdural hygromas without mass effect.  03-17-15: EEG: This is an abnormal EEG secondary to general background slowing.  This finding may be seen with a diffuse disturbance that is etiologically nonspecific, but may include a metabolic encephalopathy, among other possibilities.  Multiple myoclonic episodes were captured.  No epileptiform activity was noted, only  artifact was appreciated.        LABS REVIEWED:   03-11-15: wbc 8.9; hgb 12.;2 hct 36.3; mcv 81.2; plt 175; glucose 106; bun 15; creat 1.46; k+4.7; na++136; liver normal albumin 3.1; urine culture: enterococcus 03-13-15: wbc 8.6; hgb 12.1; hct 35.9; mcv 80.5; plt 187; hgb a1c 5.9 03-14-15: wbc 8.5; hgb 12.4; hct 36.7;mcv 80.5; plt 189; glucose 95; bun 10; creat 1.20; k+4.1; na++140; chol 100; ldl 52; trig 78; trig 32 03-17-15: wbc 12.;2 hgb 12.0; hct 35.9; mcv 80.9; plt 208; glucose 100; bun 10; creat 1.18; k+3.6; na++143; ammonia 17 03-19-15: wbc 12.4; hgb 12.9; hct 38.5; mcv 81.1; plt 206; glucose 111; bun 12; creat 1.14; k+3.8; na++145    Review of Systems  Unable to perform ROS    Physical Exam  Constitutional: No distress.  Frail   Neck: Neck supple. No JVD present.  Cardiovascular: Normal rate, regular rhythm and intact distal pulses.   Respiratory: Effort normal and breath sounds normal. No respiratory distress.  GI: Soft. Bowel sounds are normal. He exhibits no distension.  Musculoskeletal: He exhibits no edema.  Did not voluntarily move extremities   Neurological: He is alert.  Skin: Skin is warm and dry. He is not diaphoretic.      ASSESSMENT/ PLAN:  1. Acute cva: he has a history of cva in March 2016. He is neurologically stable at this time; will continue eliquis  5 mg twice daily and therapy as directed; will monitor  2. Afib: his heart rate is stable will continue lopressor 25 mg daily for rate control and eliquis 5 mg twice daily and will monitor   3. Dyslipidemia: will continue lipitor 40 mg daily his ldl is 52  4. Hypertension: will continue norvasc 10 mg daily and lopressor 12.5 mg daily   5. UTI: has completed his abt; will monitor his status.   Will have social service perform mmse and depression scale Will check cbc; and bmp  Time spent with patient 50 minutes.    Synthia Innocenteborah Chaseton Yepiz NP [facility]  Contact [phone] Monday through Friday  8am- 5pm  After hours call [phone]

## 2015-04-09 ENCOUNTER — Non-Acute Institutional Stay (SKILLED_NURSING_FACILITY): Payer: Medicare Other | Admitting: Adult Health

## 2015-04-09 DIAGNOSIS — D72829 Elevated white blood cell count, unspecified: Secondary | ICD-10-CM

## 2015-04-09 DIAGNOSIS — J189 Pneumonia, unspecified organism: Secondary | ICD-10-CM | POA: Diagnosis not present

## 2015-04-12 ENCOUNTER — Non-Acute Institutional Stay (SKILLED_NURSING_FACILITY): Payer: Medicare Other | Admitting: Adult Health

## 2015-04-12 DIAGNOSIS — J189 Pneumonia, unspecified organism: Secondary | ICD-10-CM | POA: Diagnosis not present

## 2015-04-12 DIAGNOSIS — D72829 Elevated white blood cell count, unspecified: Secondary | ICD-10-CM

## 2015-04-12 DIAGNOSIS — N179 Acute kidney failure, unspecified: Secondary | ICD-10-CM

## 2015-04-13 ENCOUNTER — Other Ambulatory Visit: Payer: Self-pay | Admitting: Internal Medicine

## 2015-04-13 ENCOUNTER — Ambulatory Visit (HOSPITAL_COMMUNITY)
Admission: RE | Admit: 2015-04-13 | Discharge: 2015-04-13 | Disposition: A | Discharge status: Home / Self Care | Payer: Medicare Other | Source: Ambulatory Visit | Attending: Internal Medicine | Admitting: Internal Medicine

## 2015-04-13 DIAGNOSIS — N390 Urinary tract infection, site not specified: Secondary | ICD-10-CM | POA: Insufficient documentation

## 2015-04-13 DIAGNOSIS — J189 Pneumonia, unspecified organism: Secondary | ICD-10-CM

## 2015-04-13 MED ORDER — LIDOCAINE HCL 1 % IJ SOLN
INTRAMUSCULAR | Status: AC
  Filled 2015-04-13: qty 20

## 2015-04-13 MED ORDER — HEPARIN SOD (PORK) LOCK FLUSH 100 UNIT/ML IV SOLN
INTRAVENOUS | Status: AC
  Filled 2015-04-13: qty 5

## 2015-04-13 NOTE — Procedures (Signed)
RUE PICC SVC RA No comp/EBL 

## 2015-04-23 ENCOUNTER — Non-Acute Institutional Stay (SKILLED_NURSING_FACILITY): Payer: Medicare Other | Admitting: Adult Health

## 2015-04-23 DIAGNOSIS — I482 Chronic atrial fibrillation, unspecified: Secondary | ICD-10-CM

## 2015-04-23 DIAGNOSIS — I63411 Cerebral infarction due to embolism of right middle cerebral artery: Secondary | ICD-10-CM | POA: Diagnosis not present

## 2015-04-23 DIAGNOSIS — I10 Essential (primary) hypertension: Secondary | ICD-10-CM | POA: Diagnosis not present

## 2015-04-23 DIAGNOSIS — N390 Urinary tract infection, site not specified: Secondary | ICD-10-CM

## 2015-04-23 DIAGNOSIS — J189 Pneumonia, unspecified organism: Secondary | ICD-10-CM

## 2015-05-31 ENCOUNTER — Non-Acute Institutional Stay (SKILLED_NURSING_FACILITY): Payer: Medicare Other | Admitting: Adult Health

## 2015-05-31 DIAGNOSIS — E785 Hyperlipidemia, unspecified: Secondary | ICD-10-CM

## 2015-05-31 DIAGNOSIS — I10 Essential (primary) hypertension: Secondary | ICD-10-CM

## 2015-05-31 DIAGNOSIS — I482 Chronic atrial fibrillation, unspecified: Secondary | ICD-10-CM

## 2015-05-31 DIAGNOSIS — R627 Adult failure to thrive: Secondary | ICD-10-CM

## 2015-05-31 DIAGNOSIS — I63411 Cerebral infarction due to embolism of right middle cerebral artery: Secondary | ICD-10-CM

## 2015-05-31 DIAGNOSIS — J189 Pneumonia, unspecified organism: Secondary | ICD-10-CM | POA: Diagnosis not present

## 2015-06-04 ENCOUNTER — Other Ambulatory Visit: Payer: Self-pay | Admitting: Internal Medicine

## 2015-06-04 ENCOUNTER — Ambulatory Visit (HOSPITAL_COMMUNITY)
Admission: RE | Admit: 2015-06-04 | Discharge: 2015-06-04 | Disposition: A | Discharge status: Home / Self Care | Payer: Medicare Other | Source: Ambulatory Visit | Attending: Internal Medicine | Admitting: Internal Medicine

## 2015-06-04 ENCOUNTER — Non-Acute Institutional Stay (SKILLED_NURSING_FACILITY): Payer: Medicare Other | Admitting: Adult Health

## 2015-06-04 DIAGNOSIS — J189 Pneumonia, unspecified organism: Secondary | ICD-10-CM

## 2015-06-04 DIAGNOSIS — N390 Urinary tract infection, site not specified: Secondary | ICD-10-CM | POA: Diagnosis not present

## 2015-06-04 DIAGNOSIS — D638 Anemia in other chronic diseases classified elsewhere: Secondary | ICD-10-CM

## 2015-06-04 DIAGNOSIS — N179 Acute kidney failure, unspecified: Secondary | ICD-10-CM

## 2015-06-04 DIAGNOSIS — E876 Hypokalemia: Secondary | ICD-10-CM

## 2015-06-04 DIAGNOSIS — N3000 Acute cystitis without hematuria: Secondary | ICD-10-CM

## 2015-06-04 MED ORDER — HEPARIN SOD (PORK) LOCK FLUSH 100 UNIT/ML IV SOLN
INTRAVENOUS | Status: AC
  Filled 2015-06-04: qty 5

## 2015-06-04 MED ORDER — LIDOCAINE HCL 1 % IJ SOLN
INTRAMUSCULAR | Status: AC
  Filled 2015-06-04: qty 20

## 2015-06-14 ENCOUNTER — Ambulatory Visit: Payer: Medicare Other | Admitting: Neurology

## 2015-06-15 ENCOUNTER — Encounter: Payer: Self-pay | Admitting: Neurology

## 2015-06-28 ENCOUNTER — Non-Acute Institutional Stay (SKILLED_NURSING_FACILITY): Payer: Medicare Other | Admitting: Adult Health

## 2015-06-28 ENCOUNTER — Encounter: Payer: Self-pay | Admitting: Adult Health

## 2015-06-28 DIAGNOSIS — I482 Chronic atrial fibrillation, unspecified: Secondary | ICD-10-CM

## 2015-06-28 DIAGNOSIS — R627 Adult failure to thrive: Secondary | ICD-10-CM

## 2015-06-28 DIAGNOSIS — N179 Acute kidney failure, unspecified: Secondary | ICD-10-CM | POA: Insufficient documentation

## 2015-06-28 DIAGNOSIS — I63411 Cerebral infarction due to embolism of right middle cerebral artery: Secondary | ICD-10-CM | POA: Diagnosis not present

## 2015-06-28 DIAGNOSIS — I10 Essential (primary) hypertension: Secondary | ICD-10-CM | POA: Diagnosis not present

## 2015-06-28 DIAGNOSIS — J189 Pneumonia, unspecified organism: Secondary | ICD-10-CM | POA: Insufficient documentation

## 2015-06-28 DIAGNOSIS — D72829 Elevated white blood cell count, unspecified: Secondary | ICD-10-CM | POA: Insufficient documentation

## 2015-06-28 DIAGNOSIS — E785 Hyperlipidemia, unspecified: Secondary | ICD-10-CM | POA: Diagnosis not present

## 2015-06-28 DIAGNOSIS — G8929 Other chronic pain: Secondary | ICD-10-CM

## 2015-06-28 DIAGNOSIS — M549 Dorsalgia, unspecified: Secondary | ICD-10-CM

## 2015-06-28 MED ORDER — ACETAMINOPHEN 325 MG PO TABS: 650.0000 mg | ORAL_TABLET | Freq: Two times a day (BID) | ORAL | Status: AC

## 2015-06-28 MED ORDER — ATORVASTATIN CALCIUM 40 MG PO TABS: 20.0000 mg | ORAL_TABLET | Freq: Every day | ORAL | Status: DC

## 2015-06-28 NOTE — Progress Notes (Signed)
Patient ID: Jerry [name], male   DOB: [birth year], 81 y.o.   MRN: [id number]    Facility: [facility]      Allergies  Allergen Reactions  . Hydrocodone Other (See Comments)    Reaction unknown  . Penicillins Other (See Comments)    REACTION: unknown  . Tetanus Toxoids Other (See Comments)    unknown    Chief Complaint  Patient presents with  . Medical Management of Chronic Issues    HPI:  He is a long term resident of this facility being seen for the management of his chronic illnesses. He has been treated for an uti and pneumonia over the past month. He continues to slowly improve. He is unable to fully participate. There are no nursing concerns at this time.    Past Medical History  Diagnosis Date  . Hypertension   . Arthritis   . Hernia   . Dysrhythmia     atrial fibrillation  . Head injury   . Humerus fracture     right  . Asthma     as a child  . Pneumonia   . Stroke [facility])     "light stroke"  . H/O hiatal hernia   . Stroke ([place]) 2016  . A-fib ([place])   . Back pain     Past Surgical History  Procedure Laterality Date  . Finger amputation Left     little  . Orif humerus fracture Right 09/15/2013    Procedure: OPEN REDUCTION INTERNAL FIXATION (ORIF) RIGHT HUMERAL SHAFT FRACTURE;  Surgeon: Marianna Payment, MD;  Location: [place];  Service: Orthopedics;  Laterality: Right;    VITAL SIGNS BP 143/75 mmHg  Pulse 90  Ht $R'5\' 7"'nf$  (1.702 m)  Wt 160 lb (72.576 kg)  BMI 25.05 kg/m2  SpO2 98%  Patient's Medications  New Prescriptions   No medications on file  Previous Medications   AMLODIPINE (NORVASC) 10 MG TABLET    Take 10 mg by mouth daily.   APIXABAN (ELIQUIS) 5 MG TABS TABLET    Take 1 tablet (5 mg total) by mouth 2 (two) times daily.   ATORVASTATIN (LIPITOR) 40 MG TABLET    Take 1 tablet (40 mg total) by mouth daily at 6 PM.   IPRATROPIUM (ATROVENT) 0.02 % NEBULIZER SOLUTION    Take 0.5 mg by nebulization every 6 (six) hours as needed for  wheezing or shortness of breath.   LEVALBUTEROL (XOPENEX) 0.63 MG/3ML NEBULIZER SOLUTION    Take 0.63 mg by nebulization every 6 (six) hours as needed for wheezing or shortness of breath.   METOPROLOL (LOPRESSOR) 50 MG TABLET    Take 0.5 tablets (25 mg total) by mouth daily.   TRAMADOL (ULTRAM) 50 MG TABLET    Take 1 tablet (50 mg total) by mouth every 6 (six) hours as needed.   VITAMIN B-12 1000 MCG TABLET    Take 1 tablet (1,000 mcg total) by mouth daily.  Modified Medications   No medications on file  Discontinued Medications   No medications on file     SIGNIFICANT DIAGNOSTIC EXAMS  03-04-15: ct of chest: 1. Subacute fractures of the anterior right second, third, and fifth ribs with subacute fractures of the anterior left third, fourth, fifth, and sixth ribs with some callus formation present. Diffuse osteopenia. 2. Changes of prior granulomatous these disease with granulomas in the left upper lobe. 3. Thoracic kyphosis. 4. Calcification of the left anterior descending coronary artery. 5. Small hiatal hernia.  03-11-15:  chest x-ray: No acute abnormality noted.  03-11-15: ct of head and cervical spine: CT of the head: Chronic atrophic and ischemic changes as well as findings of prior infarcts. These changes are stable from the prior study. CT of the cervical spine:  Healing left second rib fracture. Left clavicular head fracture without significant displacement. No other focal acute bony abnormality is noted. Multilevel degenerative change . Multilevel degenerative change without acute abnormality.  03-12-15: mri of brain: 3 mm acute white matter infarction of the right posterior frontal lobe at the vertex in the deep white matter. Late subacute infarction in the right temporal lobe with some residual internal areas of restricted diffusion. Extensive atrophic and ischemic changes of a chronic nature elsewhere throughout the brain. Evidence of superficial siderosis related to previous head  trauma. Thin subdural hygromas without mass effect.  03-17-15: EEG: This is an abnormal EEG secondary to general background slowing.  This finding may be seen with a diffuse disturbance that is etiologically nonspecific, but may include a metabolic encephalopathy, among other possibilities.  Multiple myoclonic episodes were captured.  No epileptiform activity was noted, only artifact was appreciated.      04-08-15: chest x-ray: left basilar consolidation  04-12-15: chest x-ray: left basilar pneumonia   04-22-15: chest x-ray: right arm picc line with tip in lower SVC. 2 cm lobular density/mass involving lateral mid left chest. Minimal right mid lung atelectasis     LABS REVIEWED:   03-11-15: wbc 8.9; hgb 12.;2 hct 36.3; mcv 81.2; plt 175; glucose 106; bun 15; creat 1.46; k+4.7; na++136; liver normal albumin 3.1; urine culture: enterococcus 03-13-15: wbc 8.6; hgb 12.1; hct 35.9; mcv 80.5; plt 187; hgb a1c 5.9 03-14-15: wbc 8.5; hgb 12.4; hct 36.7;mcv 80.5; plt 189; glucose 95; bun 10; creat 1.20; k+4.1; na++140; chol 100; ldl 52; trig 78; trig 32 03-17-15: wbc 12.;2 hgb 12.0; hct 35.9; mcv 80.9; plt 208; glucose 100; bun 10; creat 1.18; k+3.6; na++143; ammonia 17 03-19-15: wbc 12.4; hgb 12.9; hct 38.5; mcv 81.1; plt 206; glucose 111; bun 12; creat 1.14; k+3.8; na++145  04-08-15: wbc 21.3; hgb 10.4; hct 30.5; mcv 77.6; plt 171; glucose 113; bun 32; creat 2.49; k+ 3.6; na++137; alk phos 133; albumin 2.5 : urine culture: klebsiella pneumoniae: levaquin  04-12-15: wbc 15.7; hgb 10.5; hct 31.9; mcv 79.2; plt 177; glucose 109 bun 49; creat 3.14; k+ 4.0; na++143; alt 62; albumin 2.6  04-19-15: wbc 13.6; hgb 9.5; hct 29.5; mcv 80.4; plt 274; glucose 99; bun 65; creat 1.25; k+ 4.1; na++136  04-20-15: wbc 7.4; hgb 9.3; hct 27.5; mcv 76.0; plt 179; glucose 86; bun 30; creat 2.23; k+ 3.8; na++142       Review of Systems Unable to perform ROS: Dementia    Physical Exam Constitutional: No distress.  Frail     Eyes: Conjunctivae are normal.  Neck: Neck supple. No JVD present. No thyromegaly present.  Cardiovascular: Normal rate, regular rhythm and intact distal pulses.   Respiratory: Effort normal. No respiratory distress. He has no wheezes.  Breath sounds diminished throughout   GI: Soft. Bowel sounds are normal. He exhibits no distension. There is no tenderness.  Musculoskeletal: has trace right lower extremity edema   Able to move all extremities   Lymphadenopathy:    He has no cervical adenopathy.  Neurological: He is alert.  Skin: Skin is warm and dry. He is not diaphoretic.  Psychiatric: He has a normal mood and affect.    ASSESSMENT/ PLAN:  1. Pneumonia/UTI: has  resolved will monitor    2.   Afib: heart rate is stable will continue eliquis 5 mg twice daily and will continue lopressor 25 mg twice daily for rate control   3. Hypertension: will continue norvasc 5 mg daily and lopressor 25 mg twice daily   4. CVA: is neurologically stable; will continue eliquis 5 mg twice daily   5. Dyslipidemia: will lower lipitor to to 20 mg daily ldl is 52.   6. FTT: his current weight is 160 pounds; will continue supplements per facility and will monitor his status.  Albumin is 2.6    Will check cbc and cmp       Ok Edwards NP [facility]  Contact [phone] Monday through Friday 8am- 5pm  After hours call [phone]

## 2015-06-28 NOTE — Progress Notes (Signed)
Patient ID: Jerry [name], male   DOB: 10/07/1923, 81 y.o.   MRN: [id number]    Facility: [facility]      Allergies  Allergen Reactions  . Hydrocodone Other (See Comments)    Reaction unknown  . Penicillins Other (See Comments)    REACTION: unknown  . Tetanus Toxoids Other (See Comments)    unknown    Chief Complaint  Patient presents with  . Medical Management of Chronic Issues    HPI:  .He is a long term resident of this facility being seen for the management of his chronic illnesses. Overall his status is stable. He is unable to fully participate in the hpi or ros; but states that he has pain "all over". There are no nursing concerns at this time.     Past Medical History  Diagnosis Date  . Hypertension   . Arthritis   . Hernia   . Dysrhythmia     atrial fibrillation  . Head injury   . Humerus fracture     right  . Asthma     as a child  . Pneumonia   . Stroke [facility])     "light stroke"  . H/O hiatal hernia   . Stroke ([place]) 2016  . A-fib ([place])   . Back pain     Past Surgical History  Procedure Laterality Date  . Finger amputation Left     little  . Orif humerus fracture Right 09/15/2013    Procedure: OPEN REDUCTION INTERNAL FIXATION (ORIF) RIGHT HUMERAL SHAFT FRACTURE;  Surgeon: Marianna Payment, MD;  Location: [facility];  Service: Orthopedics;  Laterality: Right;    VITAL SIGNS BP 138/68 mmHg  Pulse 91  Ht $R'5\' 7"'OR$  (1.702 m)  Wt 160 lb (72.576 kg)  BMI 25.05 kg/m2  SpO2 93%  Patient's Medications  New Prescriptions   No medications on file  Previous Medications   AMLODIPINE (NORVASC) 10 MG TABLET    Take 10 mg by mouth daily.   APIXABAN (ELIQUIS) 5 MG TABS TABLET    Take 1 tablet (5 mg total) by mouth 2 (two) times daily.   ATORVASTATIN (LIPITOR) 40 MG TABLET    Take 0.5 tablets (20 mg total) by mouth daily at 6 PM.   IPRATROPIUM (ATROVENT) 0.02 % NEBULIZER SOLUTION    Take 0.5 mg by nebulization every 6 (six) hours as needed for  wheezing or shortness of breath.   LEVALBUTEROL (XOPENEX) 0.63 MG/3ML NEBULIZER SOLUTION    Take 0.63 mg by nebulization every 6 (six) hours as needed for wheezing or shortness of breath.   METOPROLOL (LOPRESSOR) 50 MG TABLET    Take 0.5 tablets (25 mg total) by mouth daily.   TRAMADOL (ULTRAM) 50 MG TABLET    Take 1 tablet (50 mg total) by mouth every 6 (six) hours as needed.   VITAMIN B-12 1000 MCG TABLET    Take 1 tablet (1,000 mcg total) by mouth daily.  Modified Medications   No medications on file  Discontinued Medications   No medications on file     SIGNIFICANT DIAGNOSTIC EXAMS  03-04-15: ct of chest: 1. Subacute fractures of the anterior right second, third, and fifth ribs with subacute fractures of the anterior left third, fourth, fifth, and sixth ribs with some callus formation present. Diffuse osteopenia. 2. Changes of prior granulomatous these disease with granulomas in the left upper lobe. 3. Thoracic kyphosis. 4. Calcification of the left anterior descending coronary artery. 5. Small hiatal hernia.  03-11-15: chest x-ray: No acute abnormality noted.  03-11-15: ct of head and cervical spine: CT of the head: Chronic atrophic and ischemic changes as well as findings of prior infarcts. These changes are stable from the prior study. CT of the cervical spine:  Healing left second rib fracture. Left clavicular head fracture without significant displacement. No other focal acute bony abnormality is noted. Multilevel degenerative change . Multilevel degenerative change without acute abnormality.  03-12-15: mri of brain: 3 mm acute white matter infarction of the right posterior frontal lobe at the vertex in the deep white matter. Late subacute infarction in the right temporal lobe with some residual internal areas of restricted diffusion. Extensive atrophic and ischemic changes of a chronic nature elsewhere throughout the brain. Evidence of superficial siderosis related to previous head  trauma. Thin subdural hygromas without mass effect.  03-17-15: EEG: This is an abnormal EEG secondary to general background slowing.  This finding may be seen with a diffuse disturbance that is etiologically nonspecific, but may include a metabolic encephalopathy, among other possibilities.  Multiple myoclonic episodes were captured.  No epileptiform activity was noted, only artifact was appreciated.      04-08-15: chest x-ray: left basilar consolidation  04-12-15: chest x-ray: left basilar pneumonia   04-22-15: chest x-ray: right arm picc line with tip in lower SVC. 2 cm lobular density/mass involving lateral mid left chest. Minimal right mid lung atelectasis     LABS REVIEWED:   03-11-15: wbc 8.9; hgb 12.;2 hct 36.3; mcv 81.2; plt 175; glucose 106; bun 15; creat 1.46; k+4.7; na++136; liver normal albumin 3.1; urine culture: enterococcus 03-13-15: wbc 8.6; hgb 12.1; hct 35.9; mcv 80.5; plt 187; hgb a1c 5.9 03-14-15: wbc 8.5; hgb 12.4; hct 36.7;mcv 80.5; plt 189; glucose 95; bun 10; creat 1.20; k+4.1; na++140; chol 100; ldl 52; trig 78; trig 32 03-17-15: wbc 12.;2 hgb 12.0; hct 35.9; mcv 80.9; plt 208; glucose 100; bun 10; creat 1.18; k+3.6; na++143; ammonia 17 03-19-15: wbc 12.4; hgb 12.9; hct 38.5; mcv 81.1; plt 206; glucose 111; bun 12; creat 1.14; k+3.8; na++145  04-08-15: wbc 21.3; hgb 10.4; hct 30.5; mcv 77.6; plt 171; glucose 113; bun 32; creat 2.49; k+ 3.6; na++137; alk phos 133; albumin 2.5 : urine culture: klebsiella pneumoniae: levaquin  04-12-15: wbc 15.7; hgb 10.5; hct 31.9; mcv 79.2; plt 177; glucose 109 bun 49; creat 3.14; k+ 4.0; na++143; alt 62; albumin 2.6  04-19-15: wbc 13.6; hgb 9.5; hct 29.5; mcv 80.4; plt 274; glucose 99; bun 65; creat 1.25; k+ 4.1; na++136  04-20-15: wbc 7.4; hgb 9.3; hct 27.5; mcv 76.0; plt 179; glucose 86; bun 30; creat 2.23; k+ 3.8; na++142  06-11-15: wbc 7.1; hgb 8.5; hct 25.1; mcv 78.4; plt 233; glucose 80; bun 45; creat 3.32; k+ 4.7; na++138; liver normal  albumin 2.3      Review of Systems Unable to perform ROS: Dementia    Physical Exam Constitutional: No distress.  Frail   Eyes: Conjunctivae are normal.  Neck: Neck supple. No JVD present. No thyromegaly present.  Cardiovascular: Normal rate, regular rhythm and intact distal pulses.   Respiratory: Effort normal. No respiratory distress. He has no wheezes.  Breath sounds diminished throughout   GI: Soft. Bowel sounds are normal. He exhibits no distension. There is no tenderness.  Musculoskeletal: has trace right lower extremity edema   Able to move all extremities   Lymphadenopathy:    He has no cervical adenopathy.  Neurological: He is alert.  Skin: Skin is warm  and dry. He is not diaphoretic.  Psychiatric: He has a normal mood and affect.    ASSESSMENT/ PLAN:  1. Dementia: no significant in his status; is presently not on medications; will not make changes will monitor his status   2.   Afib: heart rate is stable will continue eliquis 5 mg twice daily and will continue lopressor 25 mg twice daily for rate control   3. Hypertension: will continue norvasc 5 mg daily and lopressor 25 mg twice daily   4. CVA: is neurologically stable; will continue eliquis 5 mg twice daily   5. Dyslipidemia: will stop the lipitor his ldl is 52.   6. FTT: his current weight remains at  160 pounds; will continue supplements per facility and will monitor his status.  Albumin is 2.2   7. Chronic back pain: will begin tylenol 650 mg twice daily and will continue ultram 50 mg every 6 hours as needed and will monitor         Ok Edwards NP [facility]  Contact [phone] Monday through Friday 8am- 5pm  After hours call [phone]

## 2015-06-28 NOTE — Progress Notes (Signed)
Patient ID: Jerry [name], male   DOB: 01-24-1924, 81 y.o.   MRN: [id number]    Facility: [facility]      Allergies  Allergen Reactions  . Hydrocodone Other (See Comments)    Reaction unknown  . Penicillins Other (See Comments)    REACTION: unknown  . Tetanus Toxoids Other (See Comments)    unknown    Chief Complaint  Patient presents with  . Medical Management of Chronic Issues    HPI:  He is a long term resident of this facility being seen for the management of his chronic illnesses. He is presently being treated for pneumonia and an uti. His abt was changed from rocephin to levaquin based upon the sensitivity of his urine culture. His status continues to slowly improve.  He is unable to fully participate in the hpi or ros. There are no nursing concerns at this time.    Past Medical History  Diagnosis Date  . Hypertension   . Arthritis   . Hernia   . Dysrhythmia     atrial fibrillation  . Head injury   . Humerus fracture     right  . Asthma     as a child  . Pneumonia   . Stroke [facility])     "light stroke"  . H/O hiatal hernia   . Stroke ([place]) 2016  . A-fib ([place])   . Back pain     Past Surgical History  Procedure Laterality Date  . Finger amputation Left     little  . Orif humerus fracture Right 09/15/2013    Procedure: OPEN REDUCTION INTERNAL FIXATION (ORIF) RIGHT HUMERAL SHAFT FRACTURE;  Surgeon: Marianna Payment, MD;  Location: [place];  Service: Orthopedics;  Laterality: Right;    VITAL SIGNS BP 132/70 mmHg  Pulse 88  Ht $R'5\' 7"'eF$  (1.702 m)  Wt 154 lb (69.854 kg)  BMI 24.11 kg/m2  SpO2 95%  Patient's Medications  New Prescriptions   No medications on file  Previous Medications   AMLODIPINE (NORVASC) 10 MG TABLET    Take 10 mg by mouth daily.   APIXABAN (ELIQUIS) 5 MG TABS TABLET    Take 1 tablet (5 mg total) by mouth 2 (two) times daily.   ATORVASTATIN (LIPITOR) 40 MG TABLET    Take 1 tablet (40 mg total) by mouth daily at 6 PM.   IPRATROPIUM (ATROVENT) 0.02 % NEBULIZER SOLUTION    Take 0.5 mg by nebulization every 6 (six) hours as needed for wheezing or shortness of breath.   LEVALBUTEROL (XOPENEX) 0.63 MG/3ML NEBULIZER SOLUTION    Take 0.63 mg by nebulization every 6 (six) hours as needed for wheezing or shortness of breath.   METOPROLOL (LOPRESSOR) 50 MG TABLET    Take 0.5 tablets (25 mg total) by mouth daily.   TRAMADOL (ULTRAM) 50 MG TABLET    Take 1 tablet (50 mg total) by mouth every 6 (six) hours as needed.   VITAMIN B-12 1000 MCG TABLET    Take 1 tablet (1,000 mcg total) by mouth daily.  Modified Medications   No medications on file  Discontinued Medications   No medications on file     SIGNIFICANT DIAGNOSTIC EXAMS   03-04-15: ct of chest: 1. Subacute fractures of the anterior right second, third, and fifth ribs with subacute fractures of the anterior left third, fourth, fifth, and sixth ribs with some callus formation present. Diffuse osteopenia. 2. Changes of prior granulomatous these disease with granulomas in the left  upper lobe. 3. Thoracic kyphosis. 4. Calcification of the left anterior descending coronary artery. 5. Small hiatal hernia.  03-11-15: chest x-ray: No acute abnormality noted.  03-11-15: ct of head and cervical spine: CT of the head: Chronic atrophic and ischemic changes as well as findings of prior infarcts. These changes are stable from the prior study. CT of the cervical spine:  Healing left second rib fracture. Left clavicular head fracture without significant displacement. No other focal acute bony abnormality is noted. Multilevel degenerative change . Multilevel degenerative change without acute abnormality.  03-12-15: mri of brain: 3 mm acute white matter infarction of the right posterior frontal lobe at the vertex in the deep white matter. Late subacute infarction in the right temporal lobe with some residual internal areas of restricted diffusion. Extensive atrophic and ischemic  changes of a chronic nature elsewhere throughout the brain. 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 Thin subdural hygromas without mass effect.  03-17-15: EEG: This is an abnormal EEG secondary to general background slowing.  This finding may be seen with a diffuse disturbance that is etiologically nonspecific, but may include a metabolic encephalopathy, among other possibilities.  Multiple myoclonic episodes were captured.  No epileptiform activity was noted, only artifact was appreciated.      04-08-15: chest x-ray: left basilar consolidation  04-12-15: chest x-ray: left basilar pneumonia   04-22-15: chest x-ray: right arm picc line with tip in lower SVC. 2 cm lobular density/mass involving lateral mid left chest. Minimal right mid lung atelectasis     LABS REVIEWED:   03-11-15: wbc 8.9; hgb 12.;2 hct 36.3; mcv 81.2; plt 175; glucose 106; bun 15; creat 1.46; k+4.7; na++136; liver normal albumin 3.1; urine culture: enterococcus 03-13-15: wbc 8.6; hgb 12.1; hct 35.9; mcv 80.5; plt 187; hgb a1c 5.9 03-14-15: wbc 8.5; hgb 12.4; hct 36.7;mcv 80.5; plt 189; glucose 95; bun 10; creat 1.20; k+4.1; na++140; chol 100; ldl 52; trig 78; trig 32 03-17-15: wbc 12.;2 hgb 12.0; hct 35.9; mcv 80.9; plt 208; glucose 100; bun 10; creat 1.18; k+3.6; na++143; ammonia 17 03-19-15: wbc 12.4; hgb 12.9; hct 38.5; mcv 81.1; plt 206; glucose 111; bun 12; creat 1.14; k+3.8; na++145  04-08-15: wbc 21.3; hgb 10.4; hct 30.5; mcv 77.6; plt 171; glucose 113; bun 32; creat 2.49; k+ 3.6; na++137; alk phos 133; albumin 2.5 : urine culture: klebsiella pneumoniae: levaquin  04-12-15: wbc 15.7; hgb 10.5; hct 31.9; mcv 79.2; plt 177; glucose 109 bun 49; creat 3.14; k+ 4.0; na++143; alt 62; albumin 2.6  04-19-15: wbc 13.6; hgb 9.5; hct 29.5; mcv 80.4; plt 274; glucose 99; bun 65; creat 1.25; k+ 4.1; na++136  04-20-15: wbc 7.4; hgb 9.3; hct 27.5; mcv 76.0; plt 179; glucose 86; bun 30; creat 2.23; k+ 3.8; na++142       Review of Systems  Unable to perform ROS: Dementia      Physical Exam  Constitutional: No distress.  Frail   Eyes: Conjunctivae are normal.  Neck: Neck supple. No JVD present. No thyromegaly present.  Cardiovascular: Normal rate, regular rhythm and intact distal pulses.   Respiratory: Effort normal. No respiratory distress. He has no wheezes.  Breath sounds diminished throughout   GI: Soft. Bowel sounds are normal. He exhibits no distension. There is no tenderness.  Musculoskeletal: He exhibits no edema.  Able to move all extremities   Lymphadenopathy:    He has no cervical adenopathy.  Neurological: He is alert.  Skin: Skin is warm and dry. He is not  diaphoretic.  Psychiatric: He has a normal mood and affect.       ASSESSMENT/ PLAN:  1. Pneumonia/UTI: will complete abt and will continue to monitor his status.   2.   Afib: heart rate is stable will continue eliquis 5 mg twice daily and will continue lopressor 25 mg twice daily for rate control   3. Hypertension: will continue norvasc 5 mg daily and lopressor 25 mg twice daily   4. CVA: is neurologically stable; will continue eliquis 5 mg twice daily   5. Dyslipidemia: is presently off medication will monitor   6. FTT: his current weight is 154 pounds; will continue supplements per facility and will monitor his status.       Ok Edwards NP [facility]  Contact [phone] Monday through Friday 8am- 5pm  After hours call [phone]

## 2015-06-28 NOTE — Progress Notes (Signed)
Patient ID: Jerry [name], male   DOB: 02-02-1924, 81 y.o.   MRN: [id number]   Facility: [facility]      Allergies  Allergen Reactions  . Hydrocodone Other (See Comments)    Reaction unknown  . Penicillins Other (See Comments)    REACTION: unknown  . Tetanus Toxoids Other (See Comments)    unknown    Chief Complaint  Patient presents with  . Acute Visit    resp. status     HPI:  Staff reports that he is having increased difficulty with his breathing and tachycardia.  He is less alert today and is unable to fully participate in the hpi or ros. His po intake has been diminished over the past several days. There are no reports of fever present. He was started on rocephin yesterday along with duonebs    Past Medical History  Diagnosis Date  . Hypertension   . Arthritis   . Hernia   . Dysrhythmia     atrial fibrillation  . Head injury   . Humerus fracture     right  . Asthma     as a child  . Pneumonia   . Stroke [facility])     "light stroke"  . H/O hiatal hernia   . Stroke ([place]) 2016  . A-fib ([place])   . Back pain     Past Surgical History  Procedure Laterality Date  . Finger amputation Left     little  . Orif humerus fracture Right 09/15/2013    Procedure: OPEN REDUCTION INTERNAL FIXATION (ORIF) RIGHT HUMERAL SHAFT FRACTURE;  Surgeon: Marianna Payment, MD;  Location: [place];  Service: Orthopedics;  Laterality: Right;    VITAL SIGNS BP 130/80 mmHg  Pulse 110  Resp 22  Ht _0  (1.702 m)  Wt 154 lb (69.854 kg)  BMI 24.11 kg/m2  SpO2 92%  Patient's Medications  New Prescriptions   No medications on file  Previous Medications   AMLODIPINE (NORVASC) 10 MG TABLET    Take 10 mg by mouth daily.   APIXABAN (ELIQUIS) 5 MG TABS TABLET    Take 1 tablet (5 mg total) by mouth 2 (two) times daily.   ATORVASTATIN (LIPITOR) 40 MG TABLET    Take 1 tablet (40 mg total) by mouth daily at 6 PM.   METOPROLOL (LOPRESSOR) 50 MG TABLET    Take 0.5 tablets (25 mg  total) by mouth daily.   TRAMADOL (ULTRAM) 50 MG TABLET    Take 1 tablet (50 mg total) by mouth every 6 (six) hours as needed.   VITAMIN B-12 1000 MCG TABLET    Take 1 tablet (1,000 mcg total) by mouth daily.  Modified Medications   No medications on file  Discontinued Medications   No medications on file     SIGNIFICANT DIAGNOSTIC EXAMS   03-04-15: ct of chest: 1. Subacute fractures of the anterior right second, third, and fifth ribs with subacute fractures of the anterior left third, fourth, fifth, and sixth ribs with some callus formation present. Diffuse osteopenia. 2. Changes of prior granulomatous these disease with granulomas in the left upper lobe. 3. Thoracic kyphosis. 4. Calcification of the left anterior descending coronary artery. 5. Small hiatal hernia.  03-11-15: chest x-ray: No acute abnormality noted.  03-11-15: ct of head and cervical spine: CT of the head: Chronic atrophic and ischemic changes as well as findings of prior infarcts. These changes are stable from the prior study. CT of the cervical spine:  Healing left second rib fracture. Left clavicular head fracture without significant displacement. No other focal acute bony abnormality is noted. Multilevel degenerative change . Multilevel degenerative change without acute abnormality.  03-12-15: mri of brain: 3 mm acute white matter infarction of the right posterior frontal lobe at the vertex in the deep white matter. Late subacute infarction in the right temporal lobe with some residual internal areas of restricted diffusion. Extensive atrophic and ischemic changes of a chronic nature elsewhere throughout the brain. 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 Thin subdural hygromas without mass effect.  03-17-15: EEG: This is an abnormal EEG secondary to general background slowing.  This finding may be seen with a diffuse disturbance that is etiologically nonspecific, but may include a metabolic  encephalopathy, among other possibilities.  Multiple myoclonic episodes were captured.  No epileptiform activity was noted, only artifact was appreciated.      04-08-15: chest x-ray: left basilar consolidation    LABS REVIEWED:   03-11-15: wbc 8.9; hgb 12.;2 hct 36.3; mcv 81.2; plt 175; glucose 106; bun 15; creat 1.46; k+4.7; na++136; liver normal albumin 3.1; urine culture: enterococcus 03-13-15: wbc 8.6; hgb 12.1; hct 35.9; mcv 80.5; plt 187; hgb a1c 5.9 03-14-15: wbc 8.5; hgb 12.4; hct 36.7;mcv 80.5; plt 189; glucose 95; bun 10; creat 1.20; k+4.1; na++140; chol 100; ldl 52; trig 78; trig 32 03-17-15: wbc 12.;2 hgb 12.0; hct 35.9; mcv 80.9; plt 208; glucose 100; bun 10; creat 1.18; k+3.6; na++143; ammonia 17 03-19-15: wbc 12.4; hgb 12.9; hct 38.5; mcv 81.1; plt 206; glucose 111; bun 12; creat 1.14; k+3.8; na++145  04-08-15: wbc 21.3; hgb 10.4; hct 30.5; mcv 77.6; plt 171; glucose 113; bun 32; creat 2.49; k+ 3.6; na++137; alk phos 133; albumin 2.5     Review of Systems  Unable to perform ROS: Dementia      Physical Exam Constitutional: No distress.  Frail   Neck: Neck supple. No JVD present.  Cardiovascular: Normal rate, regular rhythm and intact distal pulses.   Respiratory: increased resp. Effort present; which crackles and rhonchi present throughout  GI: Soft. Bowel sounds are normal. He exhibits no distension.  Musculoskeletal: He exhibits no edema.  Is able to move all extremities   Neurological: He is alert.  Skin: Skin is warm and dry. He is not diaphoretic.      ASSESSMENT/ PLAN:  1. Pneumonia with leukocytosis: will comtpete 10 day coarse of rocephin. Will stop duoneb and ill start xopenex 0.63 mg every 6 hours for one week then every 6 hours as needed and atrovent every 6 hours for one week then every 6 hours as needed; will repeat cbc and cmp on Monday and will monitor his status.    Time spent with patient 45  minutes >50% time spent counseling; reviewing medical  record; tests; labs; and developing future plan of care     Ok Edwards NP [facility]  Contact [phone] Monday through Friday 8am- 5pm  After hours call [phone]

## 2015-06-28 NOTE — Progress Notes (Signed)
Patient ID: [name], male   DOB: 1924/04/21, 81 y.o.   MRN: [id number]    Facility: [facility]      Allergies  Allergen Reactions  . Hydrocodone Other (See Comments)    Reaction unknown  . Penicillins Other (See Comments)    REACTION: unknown  . Tetanus Toxoids Other (See Comments)    unknown    Chief Complaint  Patient presents with  . Acute Visit    follow up status     HPI:  He is presently being treated for pneumonia with rocephin; with xopenex and atrovent respiratory treatments. His wbc is improving from 21.3 to current 15.7. His renal function is getting worse from creat of 2.49 to 3.14 today. He is unable to participate with the hpi or ros. I have spoken with his family and they would like for him to avoid hospitalization if able.    Past Medical History  Diagnosis Date  . Hypertension   . Arthritis   . Hernia   . Dysrhythmia     atrial fibrillation  . Head injury   . Humerus fracture     right  . Asthma     as a child  . Pneumonia   . Stroke [facility])     "light stroke"  . H/O hiatal hernia   . Stroke ([place]) 2016  . A-fib ([place])   . Back pain     Past Surgical History  Procedure Laterality Date  . Finger amputation Left     little  . Orif humerus fracture Right 09/15/2013    Procedure: OPEN REDUCTION INTERNAL FIXATION (ORIF) RIGHT HUMERAL SHAFT FRACTURE;  Surgeon: Marianna Payment, MD;  Location: [place];  Service: Orthopedics;  Laterality: Right;    VITAL SIGNS BP 135/75 mmHg  Pulse 94  Resp 20  Ht $R'5\' 7"'wP$  (1.702 m)  Wt 154 lb (69.854 kg)  BMI 24.11 kg/m2  SpO2 92%  Patient's Medications  New Prescriptions   No medications on file  Previous Medications   AMLODIPINE (NORVASC) 10 MG TABLET    Take 10 mg by mouth daily.   APIXABAN (ELIQUIS) 5 MG TABS TABLET    Take 1 tablet (5 mg total) by mouth 2 (two) times daily.   ATORVASTATIN (LIPITOR) 40 MG TABLET    Take 1 tablet (40 mg total) by mouth daily at 6 PM.   IPRATROPIUM  (ATROVENT) 0.02 % NEBULIZER SOLUTION    Take 0.5 mg by nebulization every 6 (six) hours as needed for wheezing or shortness of breath.   LEVALBUTEROL (XOPENEX) 0.63 MG/3ML NEBULIZER SOLUTION    Take 0.63 mg by nebulization every 6 (six) hours as needed for wheezing or shortness of breath.   METOPROLOL (LOPRESSOR) 50 MG TABLET    Take 0.5 tablets (25 mg total) by mouth daily.   TRAMADOL (ULTRAM) 50 MG TABLET    Take 1 tablet (50 mg total) by mouth every 6 (six) hours as needed.   VITAMIN B-12 1000 MCG TABLET    Take 1 tablet (1,000 mcg total) by mouth daily.  Modified Medications   No medications on file  Discontinued Medications   No medications on file     SIGNIFICANT DIAGNOSTIC EXAMS  03-04-15: ct of chest: 1. Subacute fractures of the anterior right second, third, and fifth ribs with subacute fractures of the anterior left third, fourth, fifth, and sixth ribs with some callus formation present. Diffuse osteopenia. 2. Changes of prior granulomatous these disease with granulomas in the  left upper lobe. 3. Thoracic kyphosis. 4. Calcification of the left anterior descending coronary artery. 5. Small hiatal hernia.  03-11-15: chest x-ray: No acute abnormality noted.  03-11-15: ct of head and cervical spine: CT of the head: Chronic atrophic and ischemic changes as well as findings of prior infarcts. These changes are stable from the prior study. CT of the cervical spine:  Healing left second rib fracture. Left clavicular head fracture without significant displacement. No other focal acute bony abnormality is noted. Multilevel degenerative change . Multilevel degenerative change without acute abnormality.  03-12-15: mri of brain: 3 mm acute white matter infarction of the right posterior frontal lobe at the vertex in the deep white matter. Late subacute infarction in the right temporal lobe with some residual internal areas of restricted diffusion. Extensive atrophic and ischemic changes of a  chronic nature elsewhere throughout the brain. 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 Thin subdural hygromas without mass effect.  03-17-15: EEG: This is an abnormal EEG secondary to general background slowing.  This finding may be seen with a diffuse disturbance that is etiologically nonspecific, but may include a metabolic encephalopathy, among other possibilities.  Multiple myoclonic episodes were captured.  No epileptiform activity was noted, only artifact was appreciated.      04-08-15: chest x-ray: left basilar consolidation    LABS REVIEWED:   03-11-15: wbc 8.9; hgb 12.;2 hct 36.3; mcv 81.2; plt 175; glucose 106; bun 15; creat 1.46; k+4.7; na++136; liver normal albumin 3.1; urine culture: enterococcus 03-13-15: wbc 8.6; hgb 12.1; hct 35.9; mcv 80.5; plt 187; hgb a1c 5.9 03-14-15: wbc 8.5; hgb 12.4; hct 36.7;mcv 80.5; plt 189; glucose 95; bun 10; creat 1.20; k+4.1; na++140; chol 100; ldl 52; trig 78; trig 32 03-17-15: wbc 12.;2 hgb 12.0; hct 35.9; mcv 80.9; plt 208; glucose 100; bun 10; creat 1.18; k+3.6; na++143; ammonia 17 03-19-15: wbc 12.4; hgb 12.9; hct 38.5; mcv 81.1; plt 206; glucose 111; bun 12; creat 1.14; k+3.8; na++145  04-08-15: wbc 21.3; hgb 10.4; hct 30.5; mcv 77.6; plt 171; glucose 113; bun 32; creat 2.49; k+ 3.6; na++137; alk phos 133; albumin 2.5  04-12-15: wbc 15.7; hgb 10.5; hct 31.9; mcv 79.2; plt 177; glucose 109 bun 49; creat 3.14; k+ 4.0; na++143; alt 62; albumin 2.6    Review of Systems Unable to perform ROS: Dementia      Physical Exam Constitutional: No distress.  Frail   Neck: Neck supple. No JVD present.  Cardiovascular: Normal rate, regular rhythm and intact distal pulses.   Respiratory: increased resp. Effort present; diminished breath sounds throughout  GI: Soft. Bowel sounds are normal. He exhibits no distension.  Musculoskeletal: He exhibits no edema.  Is able to move all extremities   Neurological: He is alert.  Skin: Skin  is warm and dry. He is not diaphoretic.       ASSESSMENT/ PLAN:  1. Pneumonia with leukocytosis: will complete 10 day coarse of rocephin. Will continue xopenex and atrovent neb treatments. Will repeat chest x-ray today.   His wbc is improving   2. Acute renal failure: will being D5 1/2 NA at 75 cc per hour for 3 liters and will repeat cmp     Ok Edwards NP [facility]  Contact [phone] Monday through Friday 8am- 5pm  After hours call [phone]

## 2015-07-27 ENCOUNTER — Encounter: Payer: Self-pay | Admitting: Adult Health

## 2015-07-27 DIAGNOSIS — E876 Hypokalemia: Secondary | ICD-10-CM | POA: Insufficient documentation

## 2015-07-27 DIAGNOSIS — D638 Anemia in other chronic diseases classified elsewhere: Secondary | ICD-10-CM | POA: Insufficient documentation

## 2015-07-27 NOTE — Progress Notes (Signed)
Patient ID: Jerry [name], male   DOB: [birth year], 81 y.o.   MRN: [id number]   Facility: [facility]      Allergies  Allergen Reactions  . Hydrocodone Other (See Comments)    Reaction unknown  . Penicillins Other (See Comments)    REACTION: unknown  . Tetanus Toxoids Other (See Comments)    unknown    Chief Complaint  Patient presents with  . Acute Visit    patient status     HPI:  He has had a change in his status. He is being seen by speech therapy for dysphagia and has had his diet downgraded to pureed for aspiration. His is in acute on chronic renal failure; with poor po intake. His chest x-ray demonstrated chf with pleural effusion. There is probable pneumonia as well. He is unable to participate in the hpi or ros.  I have spoken with his son; who wants him to avoid hospitalization if at all possible.    Past Medical History  Diagnosis Date  . Hypertension   . Arthritis   . Hernia   . Dysrhythmia     atrial fibrillation  . Head injury   . Humerus fracture     right  . Asthma     as a child  . Pneumonia   . Stroke [facility])     "light stroke"  . H/O hiatal hernia   . Stroke ([place]) 2016  . A-fib ([place])   . Back pain     Past Surgical History  Procedure Laterality Date  . Finger amputation Left     little  . Orif humerus fracture Right 09/15/2013    Procedure: OPEN REDUCTION INTERNAL FIXATION (ORIF) RIGHT HUMERAL SHAFT FRACTURE;  Surgeon: Marianna Payment, MD;  Location: [place];  Service: Orthopedics;  Laterality: Right;    VITAL SIGNS BP 128/71 mmHg  Pulse 87  Ht $R'5\' 7"'Yc$  (1.702 m)  Wt 163 lb (73.936 kg)  BMI 25.52 kg/m2  SpO2 93%  Patient's Medications  New Prescriptions   No medications on file  Previous Medications   AMLODIPINE (NORVASC) 10 MG TABLET    Take 10 mg by mouth daily.  APIXABAN (ELIQUIS) 5 MG TABS TABLET    Take 1 tablet (5 mg total) by mouth 2 (two) times daily.  ATORVASTATIN (LIPITOR) 40 MG TABLET    Take 1 tablet (40 mg  total) by mouth daily at 6 PM.  IPRATROPIUM (ATROVENT) 0.02 % NEBULIZER SOLUTION    Take 0.5 mg by nebulization every 6 (six) hours as needed for wheezing or shortness of breath.  LEVALBUTEROL (XOPENEX) 0.63 MG/3ML NEBULIZER SOLUTION    Take 0.63 mg by nebulization every 6 (six) hours as needed for wheezing or shortness of breath.  METOPROLOL (LOPRESSOR) 50 MG TABLET    Take 0.5 tablets (25 mg total) by mouth daily.  TRAMADOL (ULTRAM) 50 MG TABLET    Take 1 tablet (50 mg total) by mouth every 6 (six) hours as needed.  VITAMIN B-12 1000 MCG TABLET    Take 1 tablet (1,000 mcg total) by mouth daily.     Modified Medications   No medications on file  Discontinued Medications   No medications on file     SIGNIFICANT DIAGNOSTIC EXAMS   03-04-15: ct of chest: 1. Subacute fractures of the anterior right second, third, and fifth ribs with subacute fractures of the anterior left third, fourth, fifth, and sixth ribs with some callus formation present. Diffuse osteopenia. 2. Changes of  prior granulomatous these disease with granulomas in the left upper lobe. 3. Thoracic kyphosis. 4. Calcification of the left anterior descending coronary artery. 5. Small hiatal hernia.  03-11-15: chest x-ray: No acute abnormality noted.  03-11-15: ct of head and cervical spine: CT of the head: Chronic atrophic and ischemic changes as well as findings of prior infarcts. These changes are stable from the prior study. CT of the cervical spine:  Healing left second rib fracture. Left clavicular head fracture without significant displacement. No other focal acute bony abnormality is noted. Multilevel degenerative change . Multilevel degenerative change without acute abnormality.  03-12-15: mri of brain: 3 mm acute white matter infarction of the right posterior frontal lobe at the vertex in the deep white matter. Late subacute infarction in the right temporal lobe with some residual internal areas of restricted  diffusion. Extensive atrophic and ischemic changes of a chronic nature elsewhere throughout the brain. 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 Thin subdural hygromas without mass effect.  03-17-15: EEG: This is an abnormal EEG secondary to general background slowing.  This finding may be seen with a diffuse disturbance that is etiologically nonspecific, but may include a metabolic encephalopathy, among other possibilities.  Multiple myoclonic episodes were captured.  No epileptiform activity was noted, only artifact was appreciated.      04-08-15: chest x-ray: left basilar consolidation  04-12-15: chest x-ray: left basilar pneumonia   04-22-15: chest x-ray: right arm picc line with tip in lower SVC. 2 cm lobular density/mass involving lateral mid left chest. Minimal right mid lung atelectasis   06-02-15: chest x-ray: mild chf; small left effusion     LABS REVIEWED:   03-11-15: wbc 8.9; hgb 12.;2 hct 36.3; mcv 81.2; plt 175; glucose 106; bun 15; creat 1.46; k+4.7; na++136; liver normal albumin 3.1; urine culture: enterococcus 03-13-15: wbc 8.6; hgb 12.1; hct 35.9; mcv 80.5; plt 187; hgb a1c 5.9 03-14-15: wbc 8.5; hgb 12.4; hct 36.7;mcv 80.5; plt 189; glucose 95; bun 10; creat 1.20; k+4.1; na++140; chol 100; ldl 52; trig 78; trig 32 03-17-15: wbc 12.;2 hgb 12.0; hct 35.9; mcv 80.9; plt 208; glucose 100; bun 10; creat 1.18; k+3.6; na++143; ammonia 17 03-19-15: wbc 12.4; hgb 12.9; hct 38.5; mcv 81.1; plt 206; glucose 111; bun 12; creat 1.14; k+3.8; na++145  04-08-15: wbc 21.3; hgb 10.4; hct 30.5; mcv 77.6; plt 171; glucose 113; bun 32; creat 2.49; k+ 3.6; na++137; alk phos 133; albumin 2.5 : urine culture: klebsiella pneumoniae: levaquin  04-12-15: wbc 15.7; hgb 10.5; hct 31.9; mcv 79.2; plt 177; glucose 109 bun 49; creat 3.14; k+ 4.0; na++143; alt 62; albumin 2.6  04-19-15: wbc 13.6; hgb 9.5; hct 29.5; mcv 80.4; plt 274; glucose 99; bun 65; creat 1.25; k+ 4.1; na++136  04-20-15: wbc  7.4; hgb 9.3; hct 27.5; mcv 76.0; plt 179; glucose 86; bun 30; creat 2.23; k+ 3.8; na++142  06-02-15; wbc 14.5; hgb 8.7; hct 26.0; mcv 79.0; plt 205; glucose 91; bun 51; creat 3.10; k+ 3.6; na++136; liver normal albumin 2.2 06-03-15: glucose 397; bun 48; creat 2.90; k+ 3.4; na++127  06-04-15; wbc 22.8; hgb 8.6; hct 25.3; mcv 79.8; plt 236; glucose 84; bun 53; creat 3.22; k+ 3.4; na++134      Review of Systems Unable to perform ROS: Dementia     Physical Exam Constitutional: No distress.  Frail   Eyes: Conjunctivae are normal.  Neck: Neck supple. No JVD present. No thyromegaly present.  Cardiovascular: Normal rate, regular rhythm and intact distal  pulses.   Respiratory: Effort normal. No respiratory distress. He has no wheezes.  Breath sounds diminished throughout   GI: Soft. Bowel sounds are normal. He exhibits no distension. There is no tenderness.  Musculoskeletal: has trace right lower extremity edema   Able to move all extremities   Lymphadenopathy:    He has no cervical adenopathy.  Neurological: He is alert.  Skin: Skin is warm and dry. He is not diaphoretic.  Psychiatric: He has a normal mood and affect.     ASSESSMENT/ PLAN:  1. Acute on chronic renal failure 2. Hypokalemia 3. Pneumonia 4. Anemia 5. UTI  Will begin k+ 20 meq daily; iron daily;  Will insert picc line Will begin NS at 75 cc per hour for 3 liters Will begin levaquin 750 mg IV now then 500 mg every 48 hours for 3 weeks for pneumonia and UTI Will begin clindamycin 300 mg every 6 hours IV for 3 weeks  Will get a stat chest x-ray and cbc; cmp. And will repeat chest x-ray on 06-07-15. Will monitor his status.     Time spent with patient 50 minutes >50% time spent counseling; reviewing medical record; tests; labs; and developing future plan of care    Ok Edwards NP [facility]  Contact [phone] Monday through Friday 8am- 5pm  After hours call [phone]

## 2015-08-10 ENCOUNTER — Non-Acute Institutional Stay (SKILLED_NURSING_FACILITY): Payer: Medicare Other | Admitting: Internal Medicine

## 2015-08-10 DIAGNOSIS — I10 Essential (primary) hypertension: Secondary | ICD-10-CM

## 2015-08-10 DIAGNOSIS — R627 Adult failure to thrive: Secondary | ICD-10-CM

## 2015-08-10 DIAGNOSIS — G8929 Other chronic pain: Secondary | ICD-10-CM | POA: Diagnosis not present

## 2015-08-10 DIAGNOSIS — D638 Anemia in other chronic diseases classified elsewhere: Secondary | ICD-10-CM

## 2015-08-10 DIAGNOSIS — Z8673 Personal history of transient ischemic attack (TIA), and cerebral infarction without residual deficits: Secondary | ICD-10-CM

## 2015-08-10 DIAGNOSIS — I482 Chronic atrial fibrillation, unspecified: Secondary | ICD-10-CM

## 2015-08-10 DIAGNOSIS — M549 Dorsalgia, unspecified: Secondary | ICD-10-CM | POA: Diagnosis not present

## 2015-08-10 DIAGNOSIS — E785 Hyperlipidemia, unspecified: Secondary | ICD-10-CM

## 2015-08-11 ENCOUNTER — Encounter: Payer: Self-pay | Admitting: Internal Medicine

## 2015-08-11 NOTE — Progress Notes (Signed)
Patient ID: Jerry [name], male   DOB: [birth year], 81 y.o.   MRN: [id number]    DATE:08/10/15  Location:  [facility]    Place of Service: SNF [phone])   Extended Emergency Contact Information Primary Emergency Contact: Bucks,[name] Address: [address]          [place], [place] [address] [place] of [place] Home Phone: [phone] Relation: Son Secondary Emergency Contact: Symons,[name] Address: [address]          [place] [phone] [place] of [place] Home Phone: [phone] Relation: Son  Advanced Directive information  DNR  Chief Complaint  Patient presents with  . Medical Management of Chronic Issues    HPI:  81 yo male short term resident seen today for f/u. He has no c/o today. No nursing issues. No falls. No bleeding. He is a poor historian due to encephalopathy. Hx obtained from chart  HTN - BP stable on amlodipine and metoprolol  afib - rate controlled on BB. Takes eliquis for anticoagulation  Hyperlipidemia - stable on statin  Arthritis/chronic pain - pain controlled on tramadol  Asthma - stable. No recent exacerbations  Hx CVA/encephalopathy - stable on eliquis  Anemia - stable Hgb. Takes B12 supplement  Past Medical History  Diagnosis Date  . Hypertension   . Arthritis   . Hernia   . Dysrhythmia     atrial fibrillation  . Head injury   . Humerus fracture     right  . Asthma     as a child  . Pneumonia   . Stroke [facility])     "light stroke"  . H/O hiatal hernia   . Stroke (HCC) 2016  . A-fib (HCC)   . Back pain     Past Surgical History  Procedure Laterality Date  . Finger amputation Left     little  . Orif humerus fracture Right 09/15/2013    Procedure: OPEN REDUCTION INTERNAL FIXATION (ORIF) RIGHT HUMERAL SHAFT FRACTURE;  Surgeon: Cheral Almas, MD;  Location: [facility] OR;  Service: Orthopedics;  Laterality: Right;    Patient Care Team: [name], MD as PCP - General (Family  Medicine)  Social History   Social History  . Marital Status: Widowed    Spouse Name: N/A  . Number of Children: 5  . Years of Education: 5   Occupational History  . retired     Music therapist, [employer] representative   Social History Main Topics  . Smoking status: Never Smoker   . Smokeless tobacco: Former Neurosurgeon    Types: Chew  . Alcohol Use: Yes     Comment: heavy drinker  . Drug Use: Not on file  . Sexual Activity: Not on file   Other Topics Concern  . Not on file   Social History Narrative   02/08/15 currently living at [facility], lived with son, widowed      Right handed      Caffeine use- yes, but unsure of amount per day        reports that he has never smoked. He has quit using smokeless tobacco. His smokeless tobacco use included Chew. He reports that he drinks alcohol. His drug history is not on file.   There is no immunization history on file for this patient.  Allergies  Allergen Reactions  . Hydrocodone Other (See Comments)    Reaction unknown  . Penicillins Other (See Comments)    REACTION: unknown  . Tetanus Toxoids Other (  See Comments)    unknown    Medications: Patient's Medications  New Prescriptions   No medications on file  Previous Medications   ACETAMINOPHEN (TYLENOL) 325 MG TABLET    Take 2 tablets (650 mg total) by mouth 2 (two) times daily.   AMLODIPINE (NORVASC) 10 MG TABLET    Take 10 mg by mouth daily.   APIXABAN (ELIQUIS) 5 MG TABS TABLET    Take 1 tablet (5 mg total) by mouth 2 (two) times daily.   IPRATROPIUM (ATROVENT) 0.02 % NEBULIZER SOLUTION    Take 0.5 mg by nebulization every 6 (six) hours as needed for wheezing or shortness of breath.   LEVALBUTEROL (XOPENEX) 0.63 MG/3ML NEBULIZER SOLUTION    Take 0.63 mg by nebulization every 6 (six) hours as needed for wheezing or shortness of breath.   METOPROLOL (LOPRESSOR) 50 MG TABLET    Take 0.5 tablets (25 mg total) by mouth daily.   TRAMADOL (ULTRAM) 50 MG TABLET    Take 1  tablet (50 mg total) by mouth every 6 (six) hours as needed.   VITAMIN B-12 1000 MCG TABLET    Take 1 tablet (1,000 mcg total) by mouth daily.  Modified Medications   No medications on file  Discontinued Medications   No medications on file    Review of Systems  Unable to perform ROS: Other  encephalopathy  Filed Vitals:   08/10/15 2321  BP: 134/61  Pulse: 74  Temp: 97.7 F (36.5 C)  Weight: 142 lb (64.411 kg)  SpO2: 93%   Body mass index is 22.24 kg/(m^2).  Physical Exam  Constitutional: He appears well-developed. No distress.  HENT:  Mouth/Throat: Oropharynx is clear and moist.  Eyes: Pupils are equal, round, and reactive to light. No scleral icterus.  Neck: Neck supple. Carotid bruit is not present. No thyromegaly present.  Cardiovascular: Normal rate, regular rhythm, normal heart sounds and intact distal pulses.  Exam reveals no gallop and no friction rub.   No murmur heard. +1 pitting LE edema b/l. No calf TTP  Pulmonary/Chest: Effort normal and breath sounds normal. He has no wheezes. He has no rales. He exhibits no tenderness.  Abdominal: Soft. Bowel sounds are normal. He exhibits no distension, no abdominal bruit, no pulsatile midline mass and no mass. There is no tenderness. There is no rebound and no guarding.  Musculoskeletal: He exhibits edema and tenderness.  Lymphadenopathy:    He has no cervical adenopathy.  Neurological: He is alert.  Skin: Skin is warm and dry. No rash noted.  Psychiatric: He has a normal mood and affect. His behavior is normal.     Labs reviewed: No visits with results within 3 Month(s) from this visit. Latest known visit with results is:  Admission on 03/11/2015, Discharged on 03/19/2015  No results displayed because visit has over 200 results.     CBC Latest Ref Rng 03/19/2015 03/18/2015 03/17/2015  WBC 4.0 - 10.5 K/uL 12.4(H) 12.2(H) 12.2(H)  Hemoglobin 13.0 - 17.0 g/dL 12.9(L) 12.3(L) 12.0(L)  Hematocrit 39.0 - 52.0 % 38.5(L)  37.1(L) 35.9(L)  Platelets 150 - 400 K/uL 206 196 208    CMP Latest Ref Rng 03/19/2015 03/18/2015 03/17/2015  Glucose 65 - 99 mg/dL 161(W115(H) 89 960(A100(H)  BUN 6 - 20 mg/dL 12 8 10   Creatinine 0.61 - 1.24 mg/dL 5.401.14 9.811.11 1.911.18  Sodium 135 - 145 mmol/L 145 145 143  Potassium 3.5 - 5.1 mmol/L 3.8 3.4(L) 3.6  Chloride 101 - 111 mmol/L 113(H) 115(H) 112(H)  CO2 22 - 32 mmol/L 21(L) 22 23  Calcium 8.9 - 10.3 mg/dL 1.6(X) 0.9(U) 0.4(V)  Total Protein 6.5 - 8.1 g/dL - - -  Total Bilirubin 0.3 - 1.2 mg/dL - - -  Alkaline Phos 38 - 126 U/L - - -  AST 15 - 41 U/L - - -  ALT 17 - 63 U/L - - -      No results found.   Assessment/Plan   ICD-9-CM ICD-10-CM   1. Chronic atrial fibrillation (HCC) - rate controlled 427.31 I48.2   2. Essential hypertension - stable 401.9 I10   3. Chronic back pain - pain stable 724.5 M54.9    338.29 G89.29   4. History of stroke within last year - stable V12.54 Z86.73   5. Hyperlipidemia - stable 272.4 E78.5   6. FTT (failure to thrive) in adult 783.7 R62.7    albumin 2.8 in June 2016  7. Anemia of chronic disease - stable 285.29 D63.8     Pt is medically stable on current tx plan. Continue current medications as ordered. PT/OT/ST as indicated. Will follow  Tisheena Maguire S. Ancil Linsey  [facility] [address] [place], [place] [address] [phone] Cell (Monday-Friday 8 AM - 5 PM) [phone] After 5 PM and follow prompts

## 2015-08-11 NOTE — Progress Notes (Signed)
This encounter was created in error - please disregard.  This encounter was created in error - please disregard.

## 2015-08-30 LAB — LIPID PANEL
Cholesterol: 86 mg/dL (ref 0–200)
HDL: 36 mg/dL (ref 35–70)
LDL Cholesterol: 38 mg/dL
TRIGLYCERIDES: 59 mg/dL (ref 40–160)

## 2015-09-25 ENCOUNTER — Encounter: Payer: Self-pay | Admitting: Internal Medicine

## 2015-09-30 ENCOUNTER — Non-Acute Institutional Stay (SKILLED_NURSING_FACILITY): Payer: Medicare Other | Admitting: Adult Health

## 2015-09-30 DIAGNOSIS — E785 Hyperlipidemia, unspecified: Secondary | ICD-10-CM | POA: Diagnosis not present

## 2015-09-30 DIAGNOSIS — I6340 Cerebral infarction due to embolism of unspecified cerebral artery: Secondary | ICD-10-CM | POA: Diagnosis not present

## 2015-09-30 DIAGNOSIS — I482 Chronic atrial fibrillation, unspecified: Secondary | ICD-10-CM

## 2015-09-30 DIAGNOSIS — I10 Essential (primary) hypertension: Secondary | ICD-10-CM

## 2015-09-30 DIAGNOSIS — R627 Adult failure to thrive: Secondary | ICD-10-CM | POA: Diagnosis not present

## 2015-09-30 DIAGNOSIS — D638 Anemia in other chronic diseases classified elsewhere: Secondary | ICD-10-CM

## 2015-09-30 DIAGNOSIS — E43 Unspecified severe protein-calorie malnutrition: Secondary | ICD-10-CM | POA: Diagnosis not present

## 2015-11-01 ENCOUNTER — Non-Acute Institutional Stay (SKILLED_NURSING_FACILITY): Payer: Medicare Other | Admitting: Adult Health

## 2015-11-01 DIAGNOSIS — E43 Unspecified severe protein-calorie malnutrition: Secondary | ICD-10-CM

## 2015-11-01 DIAGNOSIS — R627 Adult failure to thrive: Secondary | ICD-10-CM

## 2015-11-01 DIAGNOSIS — I482 Chronic atrial fibrillation, unspecified: Secondary | ICD-10-CM

## 2015-11-01 DIAGNOSIS — I10 Essential (primary) hypertension: Secondary | ICD-10-CM | POA: Diagnosis not present

## 2015-11-01 DIAGNOSIS — G8929 Other chronic pain: Secondary | ICD-10-CM

## 2015-11-01 DIAGNOSIS — I6340 Cerebral infarction due to embolism of unspecified cerebral artery: Secondary | ICD-10-CM

## 2015-11-01 DIAGNOSIS — M549 Dorsalgia, unspecified: Secondary | ICD-10-CM | POA: Diagnosis not present

## 2015-11-03 LAB — BASIC METABOLIC PANEL
BUN: 41 mg/dL — AB (ref 4–21)
CREATININE: 2.5 mg/dL — AB (ref 0.6–1.3)
GLUCOSE: 72 mg/dL
Potassium: 5 mmol/L (ref 3.4–5.3)
Sodium: 139 mmol/L (ref 137–147)

## 2015-11-03 LAB — HEPATIC FUNCTION PANEL
ALK PHOS: 65 U/L (ref 25–125)
ALT: 5 U/L — AB (ref 10–40)
AST: 6 U/L — AB (ref 14–40)
BILIRUBIN, TOTAL: 0.4 mg/dL

## 2015-11-20 ENCOUNTER — Encounter: Payer: Self-pay | Admitting: Adult Health

## 2015-11-20 NOTE — Progress Notes (Signed)
Patient ID: Jerry [name], male   DOB: 1924/08/12, 82 y.o.   MRN: [id number]    Facility: [facility]       Allergies  Allergen Reactions  . Hydrocodone Other (See Comments)    Reaction unknown  . Penicillins Other (See Comments)    REACTION: unknown  . Tetanus Toxoids Other (See Comments)    unknown    Chief Complaint  Patient presents with  . Medical Management of Chronic Issues    HPI:  He is a long term resident of this facility being seen for the management of his chronic illnesses. He is unable to fully participate in the phi or ros. His weight is currently stable he is slowly gaining some of his weight back. There are no nursing concerns at this time.     Past Medical History  Diagnosis Date  . Hypertension   . Arthritis   . Hernia   . Dysrhythmia     atrial fibrillation  . Head injury   . Humerus fracture     right  . Asthma     as a child  . Pneumonia   . Stroke [facility])     "light stroke"  . H/O hiatal hernia   . Stroke ([place]) 2016  . A-fib ([place])   . Back pain     Past Surgical History  Procedure Laterality Date  . Finger amputation Left     little  . Orif humerus fracture Right 09/15/2013    Procedure: OPEN REDUCTION INTERNAL FIXATION (ORIF) RIGHT HUMERAL SHAFT FRACTURE;  Surgeon: Marianna Payment, MD;  Location: [facility];  Service: Orthopedics;  Laterality: Right;    VITAL SIGNS BP 128/80 mmHg  Pulse 90  Ht '5\' 7"'$  (1.702 m)  Wt 142 lb (64.411 kg)  BMI 22.24 kg/m2  SpO2 96%  Patient's Medications  New Prescriptions   No medications on file  Previous Medications   ACETAMINOPHEN (TYLENOL) 325 MG TABLET    Take 2 tablets (650 mg total) by mouth 2 (two) times daily.   AMLODIPINE (NORVASC) 10 MG TABLET    Take 10 mg by mouth daily.   APIXABAN (ELIQUIS) 5 MG TABS TABLET    Take 1 tablet (5 mg total) by mouth 2 (two) times daily.   FERROUS SULFATE 325 (65 FE) MG TABLET    Take 325 mg by mouth daily with breakfast.   IPRATROPIUM-ALBUTEROL (DUONEB)  0.5-2.5 (3) MG/3ML SOLN    Take 3 mLs by nebulization every 6 (six) hours as needed.   METOPROLOL (LOPRESSOR) 50 MG TABLET    Take 0.5 tablets (25 mg total) by mouth daily.   MULTIPLE VITAMINS PO    Take 1 tablet by mouth daily.   POTASSIUM CHLORIDE SA (K-DUR,KLOR-CON) 20 MEQ TABLET    Take 20 mEq by mouth daily.   TRAMADOL (ULTRAM) 50 MG TABLET    Take 1 tablet (50 mg total) by mouth every 6 (six) hours as needed.   VITAMIN B-12 1000 MCG TABLET    Take 1 tablet (1,000 mcg total) by mouth daily.  Modified Medications   No medications on file  Discontinued Medications     SIGNIFICANT DIAGNOSTIC EXAMS  03-04-15: ct of chest: 1. Subacute fractures of the anterior right second, third, and fifth ribs with subacute fractures of the anterior left third, fourth, fifth, and sixth ribs with some callus formation present. Diffuse osteopenia. 2. Changes of prior granulomatous these disease with granulomas in the left upper lobe. 3. Thoracic kyphosis. 4.  Calcification of the left anterior descending coronary artery. 5. Small hiatal hernia.  03-11-15: chest x-ray: No acute abnormality noted.  03-11-15: ct of head and cervical spine: CT of the head: Chronic atrophic and ischemic changes as well as findings of prior infarcts. These changes are stable from the prior study. CT of the cervical spine:  Healing left second rib fracture. Left clavicular head fracture without significant displacement. No other focal acute bony abnormality is noted. Multilevel degenerative change . Multilevel degenerative change without acute abnormality.  03-12-15: mri of brain: 3 mm acute white matter infarction of the right posterior frontal lobe at the vertex in the deep white matter. Late subacute infarction in the right temporal lobe with some residual internal areas of restricted diffusion. Extensive atrophic and ischemic changes of a chronic nature elsewhere throughout the brain. Evidence of superficial siderosis related to  previous head trauma. Thin subdural hygromas without mass effect.  03-17-15: EEG: This is an abnormal EEG secondary to general background slowing.  This finding may be seen with a diffuse disturbance that is etiologically nonspecific, but may include a metabolic encephalopathy, among other possibilities.  Multiple myoclonic episodes were captured.  No epileptiform activity was noted, only artifact was appreciated.      04-08-15: chest x-ray: left basilar consolidation  04-12-15: chest x-ray: left basilar pneumonia   04-22-15: chest x-ray: right arm picc line with tip in lower SVC. 2 cm lobular density/mass involving lateral mid left chest. Minimal right mid lung atelectasis     LABS REVIEWED:   03-11-15: wbc 8.9; hgb 12.;2 hct 36.3; mcv 81.2; plt 175; glucose 106; bun 15; creat 1.46; k+4.7; na++136; liver normal albumin 3.1; urine culture: enterococcus 03-13-15: wbc 8.6; hgb 12.1; hct 35.9; mcv 80.5; plt 187; hgb a1c 5.9 03-14-15: wbc 8.5; hgb 12.4; hct 36.7;mcv 80.5; plt 189; glucose 95; bun 10; creat 1.20; k+4.1; na++140; chol 100; ldl 52; trig 78; trig 32 03-17-15: wbc 12.;2 hgb 12.0; hct 35.9; mcv 80.9; plt 208; glucose 100; bun 10; creat 1.18; k+3.6; na++143; ammonia 17 03-19-15: wbc 12.4; hgb 12.9; hct 38.5; mcv 81.1; plt 206; glucose 111; bun 12; creat 1.14; k+3.8; na++145  04-08-15: wbc 21.3; hgb 10.4; hct 30.5; mcv 77.6; plt 171; glucose 113; bun 32; creat 2.49; k+ 3.6; na++137; alk phos 133; albumin 2.5 : urine culture: klebsiella pneumoniae: levaquin  04-12-15: wbc 15.7; hgb 10.5; hct 31.9; mcv 79.2; plt 177; glucose 109 bun 49; creat 3.14; k+ 4.0; na++143; alt 62; albumin 2.6  04-19-15: wbc 13.6; hgb 9.5; hct 29.5; mcv 80.4; plt 274; glucose 99; bun 65; creat 1.25; k+ 4.1; na++136  04-20-15: wbc 7.4; hgb 9.3; hct 27.5; mcv 76.0; plt 179; glucose 86; bun 30; creat 2.23; k+ 3.8; na++142  06-11-15: wbc 7.1; hgb 8.5; hct 25.1; mcv 78.4; plt 233; glucose 80; bun 45; creat 3.32; k+ 4.7; na++138; liver  normal albumin 2.3  07-08-15: urine: negative for legionella 08-30-15: chol 86; ldl 38; trig 59; hdl 36 09-29-15: glucose 80; bun 48; creat 2.26; k+ 4.9; na++137      Review of Systems Unable to perform ROS: Dementia    Physical Exam Constitutional: No distress.  Frail   Eyes: Conjunctivae are normal.  Neck: Neck supple. No JVD present. No thyromegaly present.  Cardiovascular: Normal rate, regular rhythm and intact distal pulses.   Respiratory: Effort normal. No respiratory distress. He has no wheezes.  Breath sounds diminished throughout   GI: Soft. Bowel sounds are normal. He exhibits no distension. There is  no tenderness.  Musculoskeletal: has trace right lower extremity edema   Able to move all extremities   Lymphadenopathy:    He has no cervical adenopathy.  Neurological: He is alert.  Skin: Skin is warm and dry. He is not diaphoretic.  Psychiatric: He has a normal mood and affect.    ASSESSMENT/ PLAN:  1. Dementia: no significant in his status; is presently not on medications; will not make changes will monitor his status   2.   Afib: heart rate is stable will continue eliquis 5 mg twice daily and will continue lopressor 25 mg  daily for rate control   3. Hypertension: will continue norvasc 5 mg daily and lopressor 25 mg  daily   4. CVA: is neurologically stable; will continue eliquis 5 mg twice daily   5. Dyslipidemia: is off medications;  his ldl is 38.   6. FTT: his current weight remains at  142 pounds; will continue supplements per facility and will monitor his status.  Albumin is 2.2 he has gained weight from 137 pounds in Dec to current weight of 142 pounds. He was treated for pneumonia in the recent past.   7. Chronic back pain: will begin tylenol 650 mg twice daily and will continue ultram 50 mg every 6 hours as needed and will monitor            Ok Edwards NP [facility]  Contact [phone] Monday through Friday 8am- 5pm  After  hours call [phone]

## 2015-12-06 ENCOUNTER — Encounter: Payer: Self-pay | Admitting: Adult Health

## 2015-12-06 ENCOUNTER — Non-Acute Institutional Stay (SKILLED_NURSING_FACILITY): Payer: Medicare Other | Admitting: Adult Health

## 2015-12-06 DIAGNOSIS — Z7901 Long term (current) use of anticoagulants: Secondary | ICD-10-CM | POA: Diagnosis not present

## 2015-12-06 DIAGNOSIS — I63411 Cerebral infarction due to embolism of right middle cerebral artery: Secondary | ICD-10-CM | POA: Diagnosis not present

## 2015-12-06 DIAGNOSIS — I10 Essential (primary) hypertension: Secondary | ICD-10-CM | POA: Diagnosis not present

## 2015-12-06 DIAGNOSIS — R627 Adult failure to thrive: Secondary | ICD-10-CM

## 2015-12-06 NOTE — Progress Notes (Signed)
Patient ID: Jerry [name], male   DOB: Nov 22, 1923, 82 y.o.   MRN: [id number]   Facility[facility]       Allergies  Allergen Reactions  . Hydrocodone Other (See Comments)    Reaction unknown  . Penicillins Other (See Comments)    REACTION: unknown  . Tetanus Toxoids Other (See Comments)    unknown    Chief Complaint  Patient presents with  . Medical Management of Chronic Issues    Follow up    HPI:  He is a long term resident of this facility being seen for the management of his chronic illnesses. Overall his status is stable. His current weight is 145 pounds he did respond to the remeron therapy. He is unable to fully participate in the hpi or ros; but he did tell me that he is doing ok. There are no nursing concerns at this time.   Past Medical History  Diagnosis Date  . Hypertension   . Arthritis   . Hernia   . Dysrhythmia     atrial fibrillation  . Head injury   . Humerus fracture     right  . Asthma     as a child  . Pneumonia   . Stroke [facility])     "light stroke"  . H/O hiatal hernia   . Stroke ([place]) 2016  . A-fib ([place])   . Back pain     Past Surgical History  Procedure Laterality Date  . Finger amputation Left     little  . Orif humerus fracture Right 09/15/2013    Procedure: OPEN REDUCTION INTERNAL FIXATION (ORIF) RIGHT HUMERAL SHAFT FRACTURE;  Surgeon: Marianna Payment, MD;  Location: [facility];  Service: Orthopedics;  Laterality: Right;    VITAL SIGNS BP 120/51 mmHg  Pulse 74  Temp(Src) 99.1 F (37.3 C) (Oral)  Resp 20  Ht '5\' 7"'$  (1.702 m)  Wt 145 lb (65.772 kg)  BMI 22.71 kg/m2  SpO2 95%  Patient's Medications  New Prescriptions   No medications on file  Previous Medications   ACETAMINOPHEN (TYLENOL) 325 MG TABLET    Take 2 tablets (650 mg total) by mouth 2 (two) times daily.   AMLODIPINE (NORVASC) 10 MG TABLET    Take 10 mg by mouth daily.   APIXABAN (ELIQUIS) 5 MG TABS TABLET    Take 1 tablet (5 mg total) by mouth 2 (two) times daily.    FERROUS SULFATE 325 (65 FE) MG TABLET    Take 325 mg by mouth daily with breakfast.   IPRATROPIUM-ALBUTEROL (DUONEB) 0.5-2.5 (3) MG/3ML SOLN    Take 3 mLs by nebulization every 6 (six) hours as needed.   METOPROLOL (LOPRESSOR) 50 MG TABLET    Take 0.5 tablets (25 mg total) by mouth daily.   MULTIPLE VITAMINS PO    Take 1 tablet by mouth daily.   OSELTAMIVIR (TAMIFLU) 75 MG CAPSULE    Take 75 mg by mouth daily. X 14 days until 12/18/15   POTASSIUM CHLORIDE SA (K-DUR,KLOR-CON) 20 MEQ TABLET    Take 20 mEq by mouth daily.   TRAMADOL (ULTRAM) 50 MG TABLET    Take 1 tablet (50 mg total) by mouth every 6 (six) hours as needed.   VITAMIN B-12 1000 MCG TABLET    Take 1 tablet (1,000 mcg total) by mouth daily.  Modified Medications   No medications on file  Discontinued Medications   No medications on file     SIGNIFICANT DIAGNOSTIC EXAMS  03-04-15:  ct of chest: 1. Subacute fractures of the anterior right second, third, and fifth ribs with subacute fractures of the anterior left third, fourth, fifth, and sixth ribs with some callus formation present. Diffuse osteopenia. 2. Changes of prior granulomatous these disease with granulomas in the left upper lobe. 3. Thoracic kyphosis. 4. Calcification of the left anterior descending coronary artery. 5. Small hiatal hernia.  03-11-15: chest x-ray: No acute abnormality noted.  03-11-15: ct of head and cervical spine: CT of the head: Chronic atrophic and ischemic changes as well as findings of prior infarcts. These changes are stable from the prior study. CT of the cervical spine:  Healing left second rib fracture. Left clavicular head fracture without significant displacement. No other focal acute bony abnormality is noted. Multilevel degenerative change . Multilevel degenerative change without acute abnormality.  03-12-15: mri of brain: 3 mm acute white matter infarction of the right posterior frontal lobe at the vertex in the deep white matter. Late  subacute infarction in the right temporal lobe with some residual internal areas of restricted diffusion. Extensive atrophic and ischemic changes of a chronic nature elsewhere throughout the brain. 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 Thin subdural hygromas without mass effect.  03-17-15: EEG: This is an abnormal EEG secondary to general background slowing.  This finding may be seen with a diffuse disturbance that is etiologically nonspecific, but may include a metabolic encephalopathy, among other possibilities.  Multiple myoclonic episodes were captured.  No epileptiform activity was noted, only artifact was appreciated.      04-08-15: chest x-ray: left basilar consolidation  04-12-15: chest x-ray: left basilar pneumonia   04-22-15: chest x-ray: right arm picc line with tip in lower SVC. 2 cm lobular density/mass involving lateral mid left chest. Minimal right mid lung atelectasis     LABS REVIEWED:   03-11-15: wbc 8.9; hgb 12.;2 hct 36.3; mcv 81.2; plt 175; glucose 106; bun 15; creat 1.46; k+4.7; na++136; liver normal albumin 3.1; urine culture: enterococcus 03-13-15: wbc 8.6; hgb 12.1; hct 35.9; mcv 80.5; plt 187; hgb a1c 5.9 03-14-15: wbc 8.5; hgb 12.4; hct 36.7;mcv 80.5; plt 189; glucose 95; bun 10; creat 1.20; k+4.1; na++140; chol 100; ldl 52; trig 78; trig 32 03-17-15: wbc 12.;2 hgb 12.0; hct 35.9; mcv 80.9; plt 208; glucose 100; bun 10; creat 1.18; k+3.6; na++143; ammonia 17 03-19-15: wbc 12.4; hgb 12.9; hct 38.5; mcv 81.1; plt 206; glucose 111; bun 12; creat 1.14; k+3.8; na++145  04-08-15: wbc 21.3; hgb 10.4; hct 30.5; mcv 77.6; plt 171; glucose 113; bun 32; creat 2.49; k+ 3.6; na++137; alk phos 133; albumin 2.5 : urine culture: klebsiella pneumoniae: levaquin  04-12-15: wbc 15.7; hgb 10.5; hct 31.9; mcv 79.2; plt 177; glucose 109 bun 49; creat 3.14; k+ 4.0; na++143; alt 62; albumin 2.6  04-19-15: wbc 13.6; hgb 9.5; hct 29.5; mcv 80.4; plt 274; glucose 99; bun 65;  creat 1.25; k+ 4.1; na++136  04-20-15: wbc 7.4; hgb 9.3; hct 27.5; mcv 76.0; plt 179; glucose 86; bun 30; creat 2.23; k+ 3.8; na++142  06-11-15: wbc 7.1; hgb 8.5; hct 25.1; mcv 78.4; plt 233; glucose 80; bun 45; creat 3.32; k+ 4.7; na++138; liver normal albumin 2.3  07-08-15: urine: negative for legionella 08-30-15: chol 86; ldl 38; trig 59; hdl 36 09-29-15: glucose 80; bun 48; creat 2.26; k+ 4.9; na++137      Review of Systems Unable to perform ROS: Dementia    Physical Exam Constitutional: No distress.  Frail   Eyes: Conjunctivae  are normal.  Neck: Neck supple. No JVD present. No thyromegaly present.  Cardiovascular: Normal rate, regular rhythm and intact distal pulses.   Respiratory: Effort normal. No respiratory distress. He has no wheezes.  Breath sounds diminished throughout   GI: Soft. Bowel sounds are normal. He exhibits no distension. There is no tenderness.  Musculoskeletal: has trace right lower extremity edema   Able to move all extremities   Lymphadenopathy:    He has no cervical adenopathy.  Neurological: He is alert.  Skin: Skin is warm and dry. He is not diaphoretic.  Psychiatric: He has a normal mood and affect.    ASSESSMENT/ PLAN:  1. Dementia: no significant in his status; is presently not on medications; will not make changes will monitor his status   2.   Afib: heart rate is stable will continue eliquis 5 mg twice daily and will continue lopressor 25 mg  daily for rate control   3. Hypertension: will continue norvasc 5 mg daily and lopressor 25 mg  daily   4. CVA: is neurologically stable; will continue eliquis 5 mg twice daily   5. Dyslipidemia: is off medications;  his ldl is 38.   6. FTT: his current weight is 145 pounds; will continue supplements per facility and will monitor his status.  Albumin is 2.2 his weight in Nov 2016 was 142 pounds and in Jan 2017 was 137 pounds. He did respond to the remeron therapy.   7. Chronic back pain: will begin  tylenol 650 mg twice daily and will continue ultram 50 mg every 6 hours as needed and will monitor        Ok Edwards NP [facility]  Contact [phone] Monday through Friday 8am- 5pm  After hours call [phone]

## 2015-12-11 ENCOUNTER — Encounter: Payer: Self-pay | Admitting: Adult Health

## 2015-12-11 NOTE — Progress Notes (Signed)
Patient ID: Jerry [name], male   DOB: 1923/09/26, 82 y.o.   MRN: [id number]   Facility: [facility]       Allergies  Allergen Reactions  . Hydrocodone Other (See Comments)    Reaction unknown  . Penicillins Other (See Comments)    REACTION: unknown  . Tetanus Toxoids Other (See Comments)    unknown    Chief Complaint  Patient presents with  . Medical Management of Chronic Issues    HPI:  He is a long term resident of this facility being seen for the management of his chronic illnesses. He is losing weight from 142 pounds in Nov 2016 to his current weight of 131 pounds; in Jan 2017 his weight was 137 pounds. He is unable to fully participate in the hpi or ros. There are no nursing concerns at this time.    Past Medical History  Diagnosis Date  . Hypertension   . Arthritis   . Hernia   . Dysrhythmia     atrial fibrillation  . Head injury   . Humerus fracture     right  . Asthma     as a child  . Pneumonia   . Stroke [facility])     "light stroke"  . H/O hiatal hernia   . Stroke ([place]) 2016  . A-fib ([place])   . Back pain     Past Surgical History  Procedure Laterality Date  . Finger amputation Left     little  . Orif humerus fracture Right 09/15/2013    Procedure: OPEN REDUCTION INTERNAL FIXATION (ORIF) RIGHT HUMERAL SHAFT FRACTURE;  Surgeon: Marianna Payment, MD;  Location: [place];  Service: Orthopedics;  Laterality: Right;    VITAL SIGNS BP 114/61 mmHg  Pulse 100  Ht _0  (1.702 m)  Wt 131 lb (59.421 kg)  BMI 20.51 kg/m2  SpO2 96%  Patient's Medications  New Prescriptions   No medications on file  Previous Medications   ACETAMINOPHEN (TYLENOL) 325 MG TABLET    Take 2 tablets (650 mg total) by mouth 2 (two) times daily.   AMLODIPINE (NORVASC) 10 MG TABLET    Take 10 mg by mouth daily.   APIXABAN (ELIQUIS) 5 MG TABS TABLET    Take 1 tablet (5 mg total) by mouth 2 (two) times daily.   FERROUS SULFATE 325 (65 FE) MG TABLET    Take 325 mg by mouth daily with  breakfast.   IPRATROPIUM-ALBUTEROL (DUONEB) 0.5-2.5 (3) MG/3ML SOLN    Take 3 mLs by nebulization every 6 (six) hours as needed.   METOPROLOL (LOPRESSOR) 50 MG TABLET    Take 0.5 tablets (25 mg total) by mouth daily.   MULTIPLE VITAMINS PO    Take 1 tablet by mouth daily.   OSELTAMIVIR (TAMIFLU) 75 MG CAPSULE    Take 75 mg by mouth daily. X 14 days until 12/18/15   POTASSIUM CHLORIDE SA (K-DUR,KLOR-CON) 20 MEQ TABLET    Take 20 mEq by mouth daily.   TRAMADOL (ULTRAM) 50 MG TABLET    Take 1 tablet (50 mg total) by mouth every 6 (six) hours as needed.   VITAMIN B-12 1000 MCG TABLET    Take 1 tablet (1,000 mcg total) by mouth daily.  Modified Medications   No medications on file  Discontinued Medications   No medications on file     SIGNIFICANT DIAGNOSTIC EXAMS   03-04-15: ct of chest: 1. Subacute fractures of the anterior right second, third, and fifth ribs with  subacute fractures of the anterior left third, fourth, fifth, and sixth ribs with some callus formation present. Diffuse osteopenia. 2. Changes of prior granulomatous these disease with granulomas in the left upper lobe. 3. Thoracic kyphosis. 4. Calcification of the left anterior descending coronary artery. 5. Small hiatal hernia.  03-11-15: chest x-ray: No acute abnormality noted.  03-11-15: ct of head and cervical spine: CT of the head: Chronic atrophic and ischemic changes as well as findings of prior infarcts. These changes are stable from the prior study. CT of the cervical spine:  Healing left second rib fracture. Left clavicular head fracture without significant displacement. No other focal acute bony abnormality is noted. Multilevel degenerative change . Multilevel degenerative change without acute abnormality.  03-12-15: mri of brain: 3 mm acute white matter infarction of the right posterior frontal lobe at the vertex in the deep white matter. Late subacute infarction in the right temporal lobe with some residual internal  areas of restricted diffusion. Extensive atrophic and ischemic changes of a chronic nature elsewhere throughout the brain. 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 Thin subdural hygromas without mass effect.  03-17-15: EEG: This is an abnormal EEG secondary to general background slowing.  This finding may be seen with a diffuse disturbance that is etiologically nonspecific, but may include a metabolic encephalopathy, among other possibilities.  Multiple myoclonic episodes were captured.  No epileptiform activity was noted, only artifact was appreciated.      04-08-15: chest x-ray: left basilar consolidation  04-12-15: chest x-ray: left basilar pneumonia   04-22-15: chest x-ray: right arm picc line with tip in lower SVC. 2 cm lobular density/mass involving lateral mid left chest. Minimal right mid lung atelectasis     LABS REVIEWED:   03-11-15: wbc 8.9; hgb 12.;2 hct 36.3; mcv 81.2; plt 175; glucose 106; bun 15; creat 1.46; k+4.7; na++136; liver normal albumin 3.1; urine culture: enterococcus 03-13-15: wbc 8.6; hgb 12.1; hct 35.9; mcv 80.5; plt 187; hgb a1c 5.9 03-14-15: wbc 8.5; hgb 12.4; hct 36.7;mcv 80.5; plt 189; glucose 95; bun 10; creat 1.20; k+4.1; na++140; chol 100; ldl 52; trig 78; trig 32 03-17-15: wbc 12.;2 hgb 12.0; hct 35.9; mcv 80.9; plt 208; glucose 100; bun 10; creat 1.18; k+3.6; na++143; ammonia 17 03-19-15: wbc 12.4; hgb 12.9; hct 38.5; mcv 81.1; plt 206; glucose 111; bun 12; creat 1.14; k+3.8; na++145  04-08-15: wbc 21.3; hgb 10.4; hct 30.5; mcv 77.6; plt 171; glucose 113; bun 32; creat 2.49; k+ 3.6; na++137; alk phos 133; albumin 2.5 : urine culture: klebsiella pneumoniae: levaquin  04-12-15: wbc 15.7; hgb 10.5; hct 31.9; mcv 79.2; plt 177; glucose 109 bun 49; creat 3.14; k+ 4.0; na++143; alt 62; albumin 2.6  04-19-15: wbc 13.6; hgb 9.5; hct 29.5; mcv 80.4; plt 274; glucose 99; bun 65; creat 1.25; k+ 4.1; na++136  04-20-15: wbc 7.4; hgb 9.3; hct 27.5; mcv 76.0;  plt 179; glucose 86; bun 30; creat 2.23; k+ 3.8; na++142  06-11-15: wbc 7.1; hgb 8.5; hct 25.1; mcv 78.4; plt 233; glucose 80; bun 45; creat 3.32; k+ 4.7; na++138; liver normal albumin 2.3  07-08-15: urine: negative for legionella 08-30-15: chol 86; ldl 38; trig 59; hdl 36 09-29-15: glucose 80; bun 48; creat 2.26; k+ 4.9; na++137      Review of Systems Unable to perform ROS: Dementia    Physical Exam Constitutional: No distress.  Frail   Eyes: Conjunctivae are normal.  Neck: Neck supple. No JVD present. No thyromegaly present.  Cardiovascular: Normal rate,  regular rhythm and intact distal pulses.   Respiratory: Effort normal. No respiratory distress. He has no wheezes.  Breath sounds diminished throughout   GI: Soft. Bowel sounds are normal. He exhibits no distension. There is no tenderness.  Musculoskeletal: has trace right lower extremity edema   Able to move all extremities   Lymphadenopathy:    He has no cervical adenopathy.  Neurological: He is alert.  Skin: Skin is warm and dry. He is not diaphoretic.  Psychiatric: He has a normal mood and affect.    ASSESSMENT/ PLAN:  1. Dementia:  is presently not on medications;he is slowly losing weight; which can be an expected outcome at the late stage of this disease.   2.   Afib: heart rate is stable will continue eliquis 5 mg twice daily and will continue lopressor 25 mg  daily for rate control   3. Hypertension: will continue norvasc 5 mg daily and lopressor 25 mg  daily   4. CVA: is neurologically stable; will continue eliquis 5 mg twice daily   5. Dyslipidemia: is off medications;  his ldl is 38.   6. FTT: his current weight is 131 pounds; will continue supplements per facility and will monitor his status.  Albumin is 2.2 his weight in Nov 2016 was 142 pounds and in Jan 2017 was 137 pounds. Will begin him on remeron 7.5 mg nightly for 30 days for his weight loss and will monitor his status.   7. Chronic back pain: will  begin tylenol 650 mg twice daily and will continue ultram 50 mg every 6 hours as needed and will monitor    Will check cmp      Ok Edwards NP [facility]  Contact [phone] Monday through Friday 8am- 5pm  After hours call [phone]

## 2016-01-05 ENCOUNTER — Non-Acute Institutional Stay (SKILLED_NURSING_FACILITY): Payer: Medicare Other | Admitting: Adult Health

## 2016-01-05 ENCOUNTER — Encounter: Payer: Self-pay | Admitting: Adult Health

## 2016-01-05 DIAGNOSIS — D638 Anemia in other chronic diseases classified elsewhere: Secondary | ICD-10-CM

## 2016-01-05 DIAGNOSIS — I482 Chronic atrial fibrillation, unspecified: Secondary | ICD-10-CM

## 2016-01-05 DIAGNOSIS — I10 Essential (primary) hypertension: Secondary | ICD-10-CM

## 2016-01-05 DIAGNOSIS — N179 Acute kidney failure, unspecified: Secondary | ICD-10-CM

## 2016-01-05 DIAGNOSIS — N183 Chronic kidney disease, stage 3 (moderate): Secondary | ICD-10-CM | POA: Diagnosis not present

## 2016-01-05 NOTE — Progress Notes (Signed)
Patient ID: Jerry [name], male   DOB: 25-Aug-1924, 82 y.o.   MRN: [id number]   Facility[facility]       Allergies  Allergen Reactions  . Hydrocodone Other (See Comments)    Reaction unknown  . Penicillins Other (See Comments)    REACTION: unknown  . Tetanus Toxoids Other (See Comments)    unknown    Chief Complaint  Patient presents with  . Medical Management of Chronic Issues    Follow up    HPI:  He is a long term resident of this facility being seen for the management of his chronic illnesses. He cannot fully participate in the hpi or ros; but did tell me that he is feeling good. There are no nursing concerns at this time. Overall there is no significant change in his status.    Past Medical History  Diagnosis Date  . Hypertension   . Arthritis   . Hernia   . Dysrhythmia     atrial fibrillation  . Head injury   . Humerus fracture     right  . Asthma     as a child  . Pneumonia   . Stroke [facility]     "light stroke"  . H/O hiatal hernia   . Stroke ([place]) 2016  . A-fib ([place])   . Back pain   . Chronic atrial fibrillation ([place]) 03/11/2015    Past Surgical History  Procedure Laterality Date  . Finger amputation Left     little  . Orif humerus fracture Right 09/15/2013    Procedure: OPEN REDUCTION INTERNAL FIXATION (ORIF) RIGHT HUMERAL SHAFT FRACTURE;  Surgeon: Marianna Payment, MD;  Location: [place];  Service: Orthopedics;  Laterality: Right;    VITAL SIGNS BP 147/68 mmHg  Pulse 60  Temp(Src) 96.6 F (35.9 C) (Oral)  Resp 20  Ht '5\' 7"'$  (1.702 m)  Wt 140 lb 2 oz (63.56 kg)  BMI 21.94 kg/m2  SpO2 95%  Patient's Medications  New Prescriptions   No medications on file  Previous Medications   ACETAMINOPHEN (TYLENOL) 325 MG TABLET    Take 2 tablets (650 mg total) by mouth 2 (two) times daily.   AMLODIPINE (NORVASC) 10 MG TABLET    Take 10 mg by mouth daily.   APIXABAN (ELIQUIS) 5 MG TABS TABLET    Take 1 tablet (5 mg total) by mouth 2 (two) times  daily.   FERROUS SULFATE 325 (65 FE) MG TABLET    Take 325 mg by mouth daily with breakfast.   IPRATROPIUM-ALBUTEROL (DUONEB) 0.5-2.5 (3) MG/3ML SOLN    Take 3 mLs by nebulization every 6 (six) hours as needed.   METOPROLOL (LOPRESSOR) 50 MG TABLET    Take 0.5 tablets (25 mg total) by mouth daily.   MULTIPLE VITAMINS PO    Take 1 tablet by mouth daily.   POTASSIUM CHLORIDE SA (K-DUR,KLOR-CON) 20 MEQ TABLET    Take 20 mEq by mouth daily.   TRAMADOL (ULTRAM) 50 MG TABLET    Take 1 tablet (50 mg total) by mouth every 6 (six) hours as needed.   VITAMIN B-12 1000 MCG TABLET    Take 1 tablet (1,000 mcg total) by mouth daily.  Modified Medications   No medications on file  Discontinued Medications   OSELTAMIVIR (TAMIFLU) 75 MG CAPSULE    Take 75 mg by mouth daily. Reported on 01/05/2016     SIGNIFICANT DIAGNOSTIC EXAMS  03-04-15: ct of chest: 1. Subacute fractures of the anterior right  second, third, and fifth ribs with subacute fractures of the anterior left third, fourth, fifth, and sixth ribs with some callus formation present. Diffuse osteopenia. 2. Changes of prior granulomatous these disease with granulomas in the left upper lobe. 3. Thoracic kyphosis. 4. Calcification of the left anterior descending coronary artery. 5. Small hiatal hernia.  03-11-15: chest x-ray: No acute abnormality noted.  03-11-15: ct of head and cervical spine: CT of the head: Chronic atrophic and ischemic changes as well as findings of prior infarcts. These changes are stable from the prior study. CT of the cervical spine:  Healing left second rib fracture. Left clavicular head fracture without significant displacement. No other focal acute bony abnormality is noted. Multilevel degenerative change . Multilevel degenerative change without acute abnormality.  03-12-15: mri of brain: 3 mm acute white matter infarction of the right posterior frontal lobe at the vertex in the deep white matter. Late subacute infarction in  the right temporal lobe with some residual internal areas of restricted diffusion. Extensive atrophic and ischemic changes of a chronic nature elsewhere throughout the brain. 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 Thin subdural hygromas without mass effect.  03-17-15: EEG: This is an abnormal EEG secondary to general background slowing.  This finding may be seen with a diffuse disturbance that is etiologically nonspecific, but may include a metabolic encephalopathy, among other possibilities.  Multiple myoclonic episodes were captured.  No epileptiform activity was noted, only artifact was appreciated.      04-08-15: chest x-ray: left basilar consolidation  04-12-15: chest x-ray: left basilar pneumonia   04-22-15: chest x-ray: right arm picc line with tip in lower SVC. 2 cm lobular density/mass involving lateral mid left chest. Minimal right mid lung atelectasis   12-06-15: chest x-ray: no acute disease process     LABS REVIEWED:   03-11-15: wbc 8.9; hgb 12.;2 hct 36.3; mcv 81.2; plt 175; glucose 106; bun 15; creat 1.46; k+4.7; na++136; liver normal albumin 3.1; urine culture: enterococcus 03-13-15: wbc 8.6; hgb 12.1; hct 35.9; mcv 80.5; plt 187; hgb a1c 5.9 03-14-15: wbc 8.5; hgb 12.4; hct 36.7;mcv 80.5; plt 189; glucose 95; bun 10; creat 1.20; k+4.1; na++140; chol 100; ldl 52; trig 78; trig 32 03-17-15: wbc 12.;2 hgb 12.0; hct 35.9; mcv 80.9; plt 208; glucose 100; bun 10; creat 1.18; k+3.6; na++143; ammonia 17 03-19-15: wbc 12.4; hgb 12.9; hct 38.5; mcv 81.1; plt 206; glucose 111; bun 12; creat 1.14; k+3.8; na++145  04-08-15: wbc 21.3; hgb 10.4; hct 30.5; mcv 77.6; plt 171; glucose 113; bun 32; creat 2.49; k+ 3.6; na++137; alk phos 133; albumin 2.5 : urine culture: klebsiella pneumoniae: levaquin  04-12-15: wbc 15.7; hgb 10.5; hct 31.9; mcv 79.2; plt 177; glucose 109 bun 49; creat 3.14; k+ 4.0; na++143; alt 62; albumin 2.6  04-19-15: wbc 13.6; hgb 9.5; hct 29.5; mcv 80.4; plt  274; glucose 99; bun 65; creat 1.25; k+ 4.1; na++136  04-20-15: wbc 7.4; hgb 9.3; hct 27.5; mcv 76.0; plt 179; glucose 86; bun 30; creat 2.23; k+ 3.8; na++142  06-11-15: wbc 7.1; hgb 8.5; hct 25.1; mcv 78.4; plt 233; glucose 80; bun 45; creat 3.32; k+ 4.7; na++138; liver normal albumin 2.3  07-08-15: urine: negative for legionella 08-30-15: chol 86; ldl 38; trig 59; hdl 36 09-29-15: glucose 80; bun 48; creat 2.26; k+ 4.9; na++137      Review of Systems Unable to perform ROS: Dementia    Physical Exam Constitutional: No distress.  Frail   Eyes: Conjunctivae are  normal.  Neck: Neck supple. No JVD present. No thyromegaly present.  Cardiovascular: Normal rate, regular rhythm and intact distal pulses.   Respiratory: Effort normal. No respiratory distress. He has no wheezes.  Breath sounds diminished throughout   GI: Soft. Bowel sounds are normal. He exhibits no distension. There is no tenderness.  Musculoskeletal: has trace right lower extremity edema   Able to move all extremities   Lymphadenopathy:    He has no cervical adenopathy.  Neurological: He is alert.  Skin: Skin is warm and dry. He is not diaphoretic.  Psychiatric: He has a normal mood and affect.    ASSESSMENT/ PLAN:  1. Dementia: no significant in his status; is presently not on medications; will not make changes will monitor his status   2.   Afib: heart rate is stable will continue eliquis 5 mg twice daily and will continue lopressor 25 mg  daily for rate control   3. Hypertension: will continue norvasc 5 mg daily and lopressor 25 mg  daily   4. CVA: is neurologically stable; will continue eliquis 5 mg twice daily   5. Dyslipidemia: is off medications;  his ldl is 38.   6. FTT: his current weight is 140 pounds; will continue supplements per facility and will monitor his status.  Albumin is 2.2 his weight in Nov 2016 was 142 pounds and in Jan 2017 was 137 pounds.   7. Chronic back pain: will continue  tylenol 650 mg  twice daily and will continue ultram 50 mg every 6 hours as needed and will monitor      Will check cbc; cmp      Ok Edwards NP [facility]  Contact [phone] Monday through Friday 8am- 5pm  After hours call [phone]

## 2016-01-07 ENCOUNTER — Inpatient Hospital Stay (HOSPITAL_COMMUNITY)
Admission: EM | Admit: 2016-01-07 | Discharge: 2016-01-24 | DRG: 871 | Disposition: E | Payer: Medicare Other | Attending: Internal Medicine | Admitting: Internal Medicine

## 2016-01-07 ENCOUNTER — Emergency Department (HOSPITAL_COMMUNITY): Payer: Medicare Other

## 2016-01-07 ENCOUNTER — Encounter (HOSPITAL_COMMUNITY): Payer: Self-pay

## 2016-01-07 ENCOUNTER — Inpatient Hospital Stay (HOSPITAL_COMMUNITY): Payer: Medicare Other

## 2016-01-07 DIAGNOSIS — B3749 Other urogenital candidiasis: Secondary | ICD-10-CM | POA: Diagnosis present

## 2016-01-07 DIAGNOSIS — I129 Hypertensive chronic kidney disease with stage 1 through stage 4 chronic kidney disease, or unspecified chronic kidney disease: Secondary | ICD-10-CM | POA: Diagnosis present

## 2016-01-07 DIAGNOSIS — Z79899 Other long term (current) drug therapy: Secondary | ICD-10-CM | POA: Diagnosis not present

## 2016-01-07 DIAGNOSIS — I4891 Unspecified atrial fibrillation: Secondary | ICD-10-CM

## 2016-01-07 DIAGNOSIS — R32 Unspecified urinary incontinence: Secondary | ICD-10-CM | POA: Diagnosis present

## 2016-01-07 DIAGNOSIS — I959 Hypotension, unspecified: Secondary | ICD-10-CM | POA: Diagnosis present

## 2016-01-07 DIAGNOSIS — I10 Essential (primary) hypertension: Secondary | ICD-10-CM | POA: Diagnosis present

## 2016-01-07 DIAGNOSIS — I639 Cerebral infarction, unspecified: Secondary | ICD-10-CM | POA: Diagnosis present

## 2016-01-07 DIAGNOSIS — G9340 Encephalopathy, unspecified: Secondary | ICD-10-CM | POA: Diagnosis present

## 2016-01-07 DIAGNOSIS — N183 Chronic kidney disease, stage 3 unspecified: Secondary | ICD-10-CM | POA: Diagnosis present

## 2016-01-07 DIAGNOSIS — Z515 Encounter for palliative care: Secondary | ICD-10-CM | POA: Diagnosis not present

## 2016-01-07 DIAGNOSIS — R339 Retention of urine, unspecified: Secondary | ICD-10-CM | POA: Diagnosis present

## 2016-01-07 DIAGNOSIS — F0390 Unspecified dementia without behavioral disturbance: Secondary | ICD-10-CM | POA: Diagnosis present

## 2016-01-07 DIAGNOSIS — Z7901 Long term (current) use of anticoagulants: Secondary | ICD-10-CM | POA: Diagnosis not present

## 2016-01-07 DIAGNOSIS — D631 Anemia in chronic kidney disease: Secondary | ICD-10-CM | POA: Diagnosis present

## 2016-01-07 DIAGNOSIS — Z66 Do not resuscitate: Secondary | ICD-10-CM | POA: Diagnosis present

## 2016-01-07 DIAGNOSIS — D638 Anemia in other chronic diseases classified elsewhere: Secondary | ICD-10-CM | POA: Diagnosis not present

## 2016-01-07 DIAGNOSIS — A419 Sepsis, unspecified organism: Secondary | ICD-10-CM

## 2016-01-07 DIAGNOSIS — A408 Other streptococcal sepsis: Principal | ICD-10-CM | POA: Diagnosis present

## 2016-01-07 DIAGNOSIS — E43 Unspecified severe protein-calorie malnutrition: Secondary | ICD-10-CM | POA: Diagnosis present

## 2016-01-07 DIAGNOSIS — G9341 Metabolic encephalopathy: Secondary | ICD-10-CM | POA: Diagnosis present

## 2016-01-07 DIAGNOSIS — D696 Thrombocytopenia, unspecified: Secondary | ICD-10-CM | POA: Diagnosis not present

## 2016-01-07 DIAGNOSIS — I482 Chronic atrial fibrillation, unspecified: Secondary | ICD-10-CM | POA: Diagnosis present

## 2016-01-07 DIAGNOSIS — Z89022 Acquired absence of left finger(s): Secondary | ICD-10-CM

## 2016-01-07 DIAGNOSIS — N390 Urinary tract infection, site not specified: Secondary | ICD-10-CM

## 2016-01-07 DIAGNOSIS — E872 Acidosis: Secondary | ICD-10-CM | POA: Diagnosis present

## 2016-01-07 DIAGNOSIS — N179 Acute kidney failure, unspecified: Secondary | ICD-10-CM | POA: Diagnosis present

## 2016-01-07 DIAGNOSIS — R4182 Altered mental status, unspecified: Secondary | ICD-10-CM | POA: Diagnosis present

## 2016-01-07 DIAGNOSIS — B954 Other streptococcus as the cause of diseases classified elsewhere: Secondary | ICD-10-CM | POA: Diagnosis present

## 2016-01-07 DIAGNOSIS — Z9181 History of falling: Secondary | ICD-10-CM | POA: Diagnosis not present

## 2016-01-07 DIAGNOSIS — M6289 Other specified disorders of muscle: Secondary | ICD-10-CM

## 2016-01-07 DIAGNOSIS — Z8673 Personal history of transient ischemic attack (TIA), and cerebral infarction without residual deficits: Secondary | ICD-10-CM | POA: Diagnosis not present

## 2016-01-07 DIAGNOSIS — D72829 Elevated white blood cell count, unspecified: Secondary | ICD-10-CM | POA: Diagnosis present

## 2016-01-07 HISTORY — DX: Acute kidney failure, unspecified: N17.9

## 2016-01-07 HISTORY — DX: Cerebral infarction due to embolism of right middle cerebral artery: I63.411

## 2016-01-07 LAB — COMPREHENSIVE METABOLIC PANEL
ALK PHOS: 92 U/L (ref 38–126)
ALT: 31 U/L (ref 17–63)
ANION GAP: 16 — AB (ref 5–15)
AST: 41 U/L (ref 15–41)
Albumin: 2.5 g/dL — ABNORMAL LOW (ref 3.5–5.0)
BILIRUBIN TOTAL: 0.3 mg/dL (ref 0.3–1.2)
BUN: 73 mg/dL — ABNORMAL HIGH (ref 6–20)
CALCIUM: 8.9 mg/dL (ref 8.9–10.3)
CO2: 15 mmol/L — AB (ref 22–32)
CREATININE: 2.78 mg/dL — AB (ref 0.61–1.24)
Chloride: 108 mmol/L (ref 101–111)
GFR calc Af Amer: 22 mL/min — ABNORMAL LOW (ref >60)
GFR, EST NON AFRICAN AMERICAN: 19 mL/min — AB (ref >60)
Glucose, Bld: 153 mg/dL — ABNORMAL HIGH (ref 65–99)
Potassium: 4.5 mmol/L (ref 3.5–5.1)
Sodium: 139 mmol/L (ref 135–145)
TOTAL PROTEIN: 7.1 g/dL (ref 6.5–8.1)

## 2016-01-07 LAB — CBC WITH DIFFERENTIAL/PLATELET
Basophils Absolute: 0 10*3/uL (ref 0.0–0.1)
Basophils Relative: 0 %
Eosinophils Absolute: 0 10*3/uL (ref 0.0–0.7)
Eosinophils Relative: 0 %
HCT: 32.7 % — ABNORMAL LOW (ref 39.0–52.0)
HEMOGLOBIN: 10.8 g/dL — AB (ref 13.0–17.0)
LYMPHS ABS: 0.6 10*3/uL — AB (ref 0.7–4.0)
Lymphocytes Relative: 2 %
MCH: 27 pg (ref 26.0–34.0)
MCHC: 33 g/dL (ref 30.0–36.0)
MCV: 81.8 fL (ref 78.0–100.0)
MONO ABS: 0.9 10*3/uL (ref 0.1–1.0)
MONOS PCT: 3 %
NEUTROS ABS: 29.7 10*3/uL — AB (ref 1.7–7.7)
Neutrophils Relative %: 95 %
Platelets: 151 10*3/uL (ref 150–400)
RBC: 4 MIL/uL — AB (ref 4.22–5.81)
RDW: 14.1 % (ref 11.5–15.5)
WBC: 31.2 10*3/uL — ABNORMAL HIGH (ref 4.0–10.5)

## 2016-01-07 LAB — URINALYSIS, ROUTINE W REFLEX MICROSCOPIC
Bilirubin Urine: NEGATIVE
Glucose, UA: NEGATIVE mg/dL
Ketones, ur: NEGATIVE mg/dL
Nitrite: NEGATIVE
Protein, ur: 100 mg/dL — AB
SPECIFIC GRAVITY, URINE: 1.013 (ref 1.005–1.030)
pH: 6 (ref 5.0–8.0)

## 2016-01-07 LAB — PROCALCITONIN: PROCALCITONIN: 4.21 ng/mL

## 2016-01-07 LAB — LACTIC ACID, PLASMA
LACTIC ACID, VENOUS: 2.6 mmol/L — AB (ref 0.5–2.0)
Lactic Acid, Venous: 2.7 mmol/L (ref 0.5–2.0)

## 2016-01-07 LAB — PROTIME-INR
INR: 2.61 — AB (ref 0.00–1.49)
PROTHROMBIN TIME: 27.5 s — AB (ref 11.6–15.2)

## 2016-01-07 LAB — I-STAT CG4 LACTIC ACID, ED: LACTIC ACID, VENOUS: 3.93 mmol/L — AB (ref 0.5–2.0)

## 2016-01-07 LAB — URINE MICROSCOPIC-ADD ON

## 2016-01-07 LAB — APTT: aPTT: 46 seconds — ABNORMAL HIGH (ref 24–37)

## 2016-01-07 MED ORDER — ACETAMINOPHEN 650 MG RE SUPP
650.0000 mg | Freq: Four times a day (QID) | RECTAL | Status: DC | PRN
Start: 2016-01-07 — End: 2016-01-12
  Administered 2016-01-09: 650 mg via RECTAL
  Filled 2016-01-07: qty 1

## 2016-01-07 MED ORDER — SODIUM CHLORIDE 0.9 % IV SOLN
INTRAVENOUS | Status: DC
  Administered 2016-01-08: 1000 mL via INTRAVENOUS
  Administered 2016-01-08: 01:00:00 via INTRAVENOUS
  Administered 2016-01-08: 1000 mL via INTRAVENOUS
  Administered 2016-01-09: 20:00:00 via INTRAVENOUS

## 2016-01-07 MED ORDER — ONDANSETRON HCL 4 MG/2ML IJ SOLN: 4.0000 mg | Freq: Four times a day (QID) | INTRAMUSCULAR | Status: DC | PRN

## 2016-01-07 MED ORDER — SODIUM CHLORIDE 0.9% FLUSH
3.0000 mL | Freq: Two times a day (BID) | INTRAVENOUS | Status: DC
  Administered 2016-01-07 – 2016-01-10 (×6): 3 mL via INTRAVENOUS

## 2016-01-07 MED ORDER — VANCOMYCIN HCL IN DEXTROSE 1-5 GM/200ML-% IV SOLN
1000.0000 mg | INTRAVENOUS | Status: DC
  Administered 2016-01-09: 1000 mg via INTRAVENOUS
  Filled 2016-01-07: qty 200

## 2016-01-07 MED ORDER — DILTIAZEM HCL 25 MG/5ML IV SOLN
20.0000 mg | Freq: Four times a day (QID) | INTRAVENOUS | Status: DC | PRN
  Administered 2016-01-08: 20 mg via INTRAVENOUS
  Filled 2016-01-07 (×2): qty 5

## 2016-01-07 MED ORDER — SODIUM CHLORIDE 0.9 % IV BOLUS (SEPSIS)
1000.0000 mL | INTRAVENOUS | Status: AC
  Administered 2016-01-07 (×2): 1000 mL via INTRAVENOUS

## 2016-01-07 MED ORDER — DEXTROSE 5 % IV SOLN
2.0000 g | Freq: Once | INTRAVENOUS | Status: AC
  Administered 2016-01-07: 2 g via INTRAVENOUS
  Filled 2016-01-07: qty 2

## 2016-01-07 MED ORDER — DEXTROSE 5 % IV SOLN
1.0000 g | INTRAVENOUS | Status: DC
  Administered 2016-01-08 – 2016-01-09 (×2): 1 g via INTRAVENOUS
  Filled 2016-01-07 (×3): qty 1

## 2016-01-07 MED ORDER — FLUCONAZOLE IN SODIUM CHLORIDE 200-0.9 MG/100ML-% IV SOLN
200.0000 mg | Freq: Once | INTRAVENOUS | Status: AC
  Administered 2016-01-07: 200 mg via INTRAVENOUS
  Filled 2016-01-07: qty 100

## 2016-01-07 MED ORDER — SODIUM CHLORIDE 0.9 % IV SOLN
INTRAVENOUS | Status: DC
  Administered 2016-01-07: 20:00:00 via INTRAVENOUS

## 2016-01-07 MED ORDER — ONDANSETRON HCL 4 MG PO TABS: 4.0000 mg | ORAL_TABLET | Freq: Four times a day (QID) | ORAL | Status: DC | PRN

## 2016-01-07 MED ORDER — IPRATROPIUM-ALBUTEROL 0.5-2.5 (3) MG/3ML IN SOLN: 3.0000 mL | Freq: Four times a day (QID) | RESPIRATORY_TRACT | Status: DC | PRN

## 2016-01-07 MED ORDER — APIXABAN 5 MG PO TABS
5.0000 mg | ORAL_TABLET | Freq: Two times a day (BID) | ORAL | Status: DC
Start: 2016-01-07 — End: 2016-01-07

## 2016-01-07 MED ORDER — DEXTROSE 5 % IV SOLN
2.0000 g | Freq: Once | INTRAVENOUS | Status: DC
  Filled 2016-01-07: qty 2

## 2016-01-07 MED ORDER — METOPROLOL TARTRATE 25 MG PO TABS
25.0000 mg | ORAL_TABLET | Freq: Every day | ORAL | Status: DC
  Administered 2016-01-08: 25 mg via ORAL
  Filled 2016-01-07: qty 1

## 2016-01-07 MED ORDER — VANCOMYCIN HCL IN DEXTROSE 1-5 GM/200ML-% IV SOLN
1000.0000 mg | Freq: Once | INTRAVENOUS | Status: AC
  Administered 2016-01-07: 1000 mg via INTRAVENOUS
  Filled 2016-01-07: qty 200

## 2016-01-07 MED ORDER — ACETAMINOPHEN 325 MG PO TABS: 650.0000 mg | ORAL_TABLET | Freq: Four times a day (QID) | ORAL | Status: DC | PRN

## 2016-01-07 MED ORDER — LEVOFLOXACIN IN D5W 750 MG/150ML IV SOLN
750.0000 mg | Freq: Once | INTRAVENOUS | Status: DC
  Filled 2016-01-07: qty 150

## 2016-01-07 NOTE — ED Notes (Signed)
EDP at bedside. Attempted report to floor.

## 2016-01-07 NOTE — ED Notes (Signed)
Patient transported to CT 

## 2016-01-07 NOTE — ED Notes (Signed)
Pt. [facility] for abnormal WBC of 44.6 per facility. Pt. Having routine labs drawn today. Pt. Family reports he has been lethargic for 'a while' now. Pt. tachypneic and Afib with rate at 140 for EMS. Pt is DNR. Pt. Denies any pain.

## 2016-01-07 NOTE — ED Provider Notes (Signed)
CSN: [id number]     Arrival date & time 2016-01-10  1619 History   First MD Initiated Contact with Patient 2016-01-10 1645     Chief Complaint  Patient presents with  . Abnormal Lab     (Consider location/radiation/quality/duration/timing/severity/associated sxs/prior Treatment) The history is provided by a relative, the nursing home and the EMS personnel.  82 year old patient resident of [facility] nursing facility. Patient is a DO NOT RESUSCITATE. Patient's been wheelchair-bound for a year. Patient with altered mental status since Wednesday. Patient's been extremely sleepy not awake not talking very well. Patient normally is very talkative. Nursing facility noted the patient had a white blood cell count of 44,000 was referred in for evaluation of the abnormal lab. Patient has a history of chronic atrial fibrillation. Patient is on anticoagulation therapy.  Past Medical History  Diagnosis Date  . Hypertension   . Arthritis   . Hernia   . Dysrhythmia     atrial fibrillation  . Head injury   . Humerus fracture     right  . Asthma     as a child  . Pneumonia   . Stroke [facility])     "light stroke"  . H/O hiatal hernia   . Stroke (HCC) 2016  . A-fib (HCC)   . Back pain   . Chronic atrial fibrillation (HCC) 03/11/2015   Past Surgical History  Procedure Laterality Date  . Finger amputation Left     little  . Orif humerus fracture Right 09/15/2013    Procedure: OPEN REDUCTION INTERNAL FIXATION (ORIF) RIGHT HUMERAL SHAFT FRACTURE;  Surgeon: Cheral Almas, MD;  Location: [facility] OR;  Service: Orthopedics;  Laterality: Right;   Family History  Problem Relation Age of Onset  . Heart attack Brother    Social History  Substance Use Topics  . Smoking status: Never Smoker   . Smokeless tobacco: Former Neurosurgeon    Types: Chew  . Alcohol Use: Yes     Comment: heavy drinker    Review of Systems  Unable to perform ROS: Mental status change      Allergies  Hydrocodone; Penicillins; and  Tetanus toxoids  Home Medications   Prior to Admission medications   Medication Sig Start Date End Date Taking? Authorizing Provider  acetaminophen (TYLENOL) 325 MG tablet Take 2 tablets (650 mg total) by mouth 2 (two) times daily. 06/28/15   Sharee Holster, NP  amLODipine (NORVASC) 10 MG tablet Take 10 mg by mouth daily.    Historical Provider, MD  apixaban (ELIQUIS) 5 MG TABS tablet Take 1 tablet (5 mg total) by mouth 2 (two) times daily. 12/14/14   Richarda Overlie, MD  ferrous sulfate 325 (65 FE) MG tablet Take 325 mg by mouth daily with breakfast.    Historical Provider, MD  ipratropium-albuterol (DUONEB) 0.5-2.5 (3) MG/3ML SOLN Take 3 mLs by nebulization every 6 (six) hours as needed.    Historical Provider, MD  metoprolol (LOPRESSOR) 50 MG tablet Take 0.5 tablets (25 mg total) by mouth daily. 12/14/14   Richarda Overlie, MD  MULTIPLE VITAMINS PO Take 1 tablet by mouth daily.    Historical Provider, MD  potassium chloride SA (K-DUR,KLOR-CON) 20 MEQ tablet Take 20 mEq by mouth daily.    Historical Provider, MD  traMADol (ULTRAM) 50 MG tablet Take 1 tablet (50 mg total) by mouth every 6 (six) hours as needed. 12/14/14   Richarda Overlie, MD  vitamin B-12 1000 MCG tablet Take 1 tablet (1,000 mcg total) by mouth daily.  12/14/14   Richarda Overlie, MD   BP 113/62 mmHg  Pulse 115  Temp(Src) 100.7 F (38.2 C) (Rectal)  Resp 25  SpO2 96% Physical Exam  Constitutional: He is oriented to person, place, and time. He appears well-developed and well-nourished. No distress.  HENT:  Head: Normocephalic and atraumatic.  Eyes: Conjunctivae and EOM are normal. Pupils are equal, round, and reactive to light.  Neck: Normal range of motion. Neck supple.  Cardiovascular:  Tachycardic  Pulmonary/Chest: Effort normal and breath sounds normal. No respiratory distress.  Abdominal: Soft. Bowel sounds are normal. There is no tenderness.  Musculoskeletal: Normal range of motion.  Neurological: He is alert and oriented to  person, place, and time. No cranial nerve deficit. He exhibits normal muscle tone. Coordination normal.  Skin: Skin is warm.  Nursing note and vitals reviewed.   ED Course  Procedures (including critical care time) Labs Review Labs Reviewed  COMPREHENSIVE METABOLIC PANEL - Abnormal; Notable for the following:    CO2 15 (*)    Glucose, Bld 153 (*)    BUN 73 (*)    Creatinine, Ser 2.78 (*)    Albumin 2.5 (*)    GFR calc non Af Amer 19 (*)    GFR calc Af Amer 22 (*)    Anion gap 16 (*)    All other components within normal limits  CBC WITH DIFFERENTIAL/PLATELET - Abnormal; Notable for the following:    WBC 31.2 (*)    RBC 4.00 (*)    Hemoglobin 10.8 (*)    HCT 32.7 (*)    Neutro Abs 29.7 (*)    Lymphs Abs 0.6 (*)    All other components within normal limits  URINALYSIS, ROUTINE W REFLEX MICROSCOPIC (NOT AT [facility]) - Abnormal; Notable for the following:    APPearance TURBID (*)    Hgb urine dipstick LARGE (*)    Protein, ur 100 (*)    Leukocytes, UA LARGE (*)    All other components within normal limits  URINE MICROSCOPIC-ADD ON - Abnormal; Notable for the following:    Squamous Epithelial / LPF 0-5 (*)    Bacteria, UA MANY (*)    All other components within normal limits  I-STAT CG4 LACTIC ACID, ED - Abnormal; Notable for the following:    Lactic Acid, Venous 3.93 (*)    All other components within normal limits  URINE CULTURE   Results for orders placed or performed during the hospital encounter of 12/27/2015  Comprehensive metabolic panel  Result Value Ref Range   Sodium 139 135 - 145 mmol/L   Potassium 4.5 3.5 - 5.1 mmol/L   Chloride 108 101 - 111 mmol/L   CO2 15 (L) 22 - 32 mmol/L   Glucose, Bld 153 (H) 65 - 99 mg/dL   BUN 73 (H) 6 - 20 mg/dL   Creatinine, Ser 6.04 (H) 0.61 - 1.24 mg/dL   Calcium 8.9 8.9 - 54.0 mg/dL   Total Protein 7.1 6.5 - 8.1 g/dL   Albumin 2.5 (L) 3.5 - 5.0 g/dL   AST 41 15 - 41 U/L   ALT 31 17 - 63 U/L   Alkaline Phosphatase 92 38 - 126 U/L    Total Bilirubin 0.3 0.3 - 1.2 mg/dL   GFR calc non Af Amer 19 (L) >60 mL/min   GFR calc Af Amer 22 (L) >60 mL/min   Anion gap 16 (H) 5 - 15  CBC with Differential  Result Value Ref Range   WBC 31.2 (H)  4.0 - 10.5 K/uL   RBC 4.00 (L) 4.22 - 5.81 MIL/uL   Hemoglobin 10.8 (L) 13.0 - 17.0 g/dL   HCT 16.132.7 (L) 09.639.0 - 04.552.0 %   MCV 81.8 78.0 - 100.0 fL   MCH 27.0 26.0 - 34.0 pg   MCHC 33.0 30.0 - 36.0 g/dL   RDW 40.914.1 81.111.5 - 91.415.5 %   Platelets 151 150 - 400 K/uL   Neutrophils Relative % 95 %   Lymphocytes Relative 2 %   Monocytes Relative 3 %   Eosinophils Relative 0 %   Basophils Relative 0 %   Neutro Abs 29.7 (H) 1.7 - 7.7 K/uL   Lymphs Abs 0.6 (L) 0.7 - 4.0 K/uL   Monocytes Absolute 0.9 0.1 - 1.0 K/uL   Eosinophils Absolute 0.0 0.0 - 0.7 K/uL   Basophils Absolute 0.0 0.0 - 0.1 K/uL   RBC Morphology POLYCHROMASIA PRESENT   Urinalysis, Routine w reflex microscopic  Result Value Ref Range   Color, Urine YELLOW YELLOW   APPearance TURBID (A) CLEAR   Specific Gravity, Urine 1.013 1.005 - 1.030   pH 6.0 5.0 - 8.0   Glucose, UA NEGATIVE NEGATIVE mg/dL   Hgb urine dipstick LARGE (A) NEGATIVE   Bilirubin Urine NEGATIVE NEGATIVE   Ketones, ur NEGATIVE NEGATIVE mg/dL   Protein, ur 782100 (A) NEGATIVE mg/dL   Nitrite NEGATIVE NEGATIVE   Leukocytes, UA LARGE (A) NEGATIVE  Urine microscopic-add on  Result Value Ref Range   Squamous Epithelial / LPF 0-5 (A) NONE SEEN   WBC, UA TOO NUMEROUS TO COUNT 0 - 5 WBC/hpf   RBC / HPF TOO NUMEROUS TO COUNT 0 - 5 RBC/hpf   Bacteria, UA MANY (A) NONE SEEN   Urine-Other YEAST PRESENT   I-Stat CG4 Lactic Acid, ED  Result Value Ref Range   Lactic Acid, Venous 3.93 (HH) 0.5 - 2.0 mmol/L   Comment NOTIFIED PHYSICIAN      Imaging Review Dg Chest Port 1 View  12/25/2015  CLINICAL DATA:  Sepsis. EXAM: PORTABLE CHEST 1 VIEW COMPARISON:  03/11/2015 and chest CT dated 03/04/2015. FINDINGS: Stable enlarged cardiac silhouette. Mildly prominent interstitial  markings. Normal vascularity. No pleural fluid. Diffuse osteopenia. Atheromatous arterial calcifications. IMPRESSION: No acute abnormality. Cardiomegaly and chronic interstitial lung disease. Electronically Signed   By: Beckie SaltsSteven  Reid M.D.   On: 01/22/2016 17:25   I have personally reviewed and evaluated these images and lab results as part of my medical decision-making.   EKG Interpretation   Date/Time:  Friday January 07 2016 16:20:34 EDT Ventricular Rate:  131 PR Interval:    QRS Duration: 85 QT Interval:  315 QTC Calculation: 465 R Axis:   27 Text Interpretation:  Atrial flutter with predominant 2:1 AV block Paired  ventricular premature complexes Low voltage, extremity leads Confirmed by  Areyanna Figeroa  MD, Romon Devereux [id number]) on 01/03/2016 4:23:47 PM      CRITICAL CARE Performed by: Vanetta MuldersZACKOWSKI,Girtie Wiersma Total critical care time: 30 minutes Critical care time was exclusive of separately billable procedures and treating other patients. Critical care was necessary to treat or prevent imminent or life-threatening deterioration. Critical care was time spent personally by me on the following activities: development of treatment plan with patient and/or surrogate as well as nursing, discussions with consultants, evaluation of patient's response to treatment, examination of patient, obtaining history from patient or surrogate, ordering and performing treatments and interventions, ordering and review of laboratory studies, ordering and review of radiographic studies, pulse oximetry and re-evaluation of patient's condition.  MDM   Final diagnoses:  Sepsis, due to unspecified organism [facility])  UTI (lower urinary tract infection)    Patient with a history of some dementia periods been not very confused for the past 3 days very somnolent. Normally only gets around with a wheelchair. Patient was found slumped over in wheelchair today. Here patient with fever and tachycardia but septic criteria. Sepsis  protocol started. Patient with history of penicillin allergy but according to the pharmacy patient has had Rocephin in the past. So received a broad-spectrum antibiotics based on the sepsis protocol for somebody that can have the penicillin. Patient's chest x-ray negative for pneumonia urinalysis though showed basically extremely purulent urine. Most likely the source. Patient is a DO NOT RESUSCITATE. Patient's followed by [facility] care at [facility]. Patient showed improvement with fluids heart rate came down from 130s to 105 range. Patient has a history of atrial fibrillation. Rhythm looked more like a flutter type pattern however. Patient is on blood thinners. Patient will be admitted to step down unit.  Vanetta Mulders, MD 02/02/16 [phone]

## 2016-01-07 NOTE — Progress Notes (Signed)
CRITICAL VALUE ALERT  Critical value received:  Lactic Acid 2.7   Date of notification:  2016/05/21  Time of notification:  2313  Critical value read back:Yes.    Nurse who received alert:  Gates RiggM. Kayven Aldaco, RN  MD notified (1st page): Elray McgregorMary Lynch  Time of first page:  2313  MD notified (2nd page):  Time of second page:  Responding MD:  Elray McgregorMary Lynch  Time MD responded:  [phone]

## 2016-01-07 NOTE — H&P (Signed)
[facility] History and Physical  Jerry [name] ZOX:[id number] DOB: Jan 29, 1924 DOA: 01/26/16  PCP: Kirt Boys, DO   Chief Complaint: Altered mental status  HPI: Jerry [name] is a 82 y.o. gentleman with a history of CVA (multiple prior infarcts, admitted here in June 2016 with acute CVA), HTN, and chronic atrial fibrillation (anticoagulated with Eliquis) who presents from [facility] for evaluation of altered mental status.  The patient is accompanied by his daughter Jerry [name] who assists with clinical history.  She reports that she has noticed increased lethargy since Wednesday.  The patient has not indicated any pain.  He has urinary incontinence at baseline and wears adult diapers.  There had not been any mention of decreased urine output.  No nausea or vomiting.  No diarrhea.  He has had a dry cough for one day.  The patient was still eating small amounts with assistance.  However, he has had progressive weakness.  He normally participates with physical therapy and can ambulate short distances with a walker and 1-2 person assistance.  However, he is in a wheel chair most days due to fall risk.  Today, he spiked a fever and had a fall out of his wheel chair (unsure if this was syncopal or just secondary to progressive weakness).  This prompted transfer to the ED tonight.  He had a documented fever of 100.7 here.  He was found to have acute urinary retention (required in and out foley for urine sample, initial output reportedly looked like pus, the patient was retaining 1L of urine in his bladder).  Code sepsis was called.  Hospitalist asked to admit.  Review of Systems: Limited ROS negative except as stated in the HPI.  Past Medical History  Diagnosis Date  . Hypertension   . Arthritis   . Hernia   . Dysrhythmia     atrial fibrillation  . Head injury   . Humerus fracture     right  . Asthma     as a child  . Pneumonia   . Stroke [facility])     "light stroke"  . H/O hiatal hernia    . Stroke (HCC) 2016  . A-fib (HCC)   . Back pain   . Chronic atrial fibrillation (HCC) 03/11/2015   Past Surgical History  Procedure Laterality Date  . Finger amputation Left     little  . Orif humerus fracture Right 09/15/2013    Procedure: OPEN REDUCTION INTERNAL FIXATION (ORIF) RIGHT HUMERAL SHAFT FRACTURE;  Surgeon: Cheral Almas, MD;  Location: [facility] OR;  Service: Orthopedics;  Laterality: Right;   Social History:  No tobacco, EtOH, or illicit drug use.  He is a widower.  He has five adult children.  His son Jerry [name] is his designated POA.  Currently a resident at [facility].  Allergies  Allergen Reactions  . Hydrocodone Other (See Comments)    Reaction unknown  . Penicillins Other (See Comments)    REACTION: unknown  . Tetanus Toxoids Other (See Comments)    unknown    Family History  Problem Relation Age of Onset  . Heart attack Brother     Prior to Admission medications   Medication Sig Start Date End Date Taking? Authorizing Provider  acetaminophen (TYLENOL) 325 MG tablet Take 2 tablets (650 mg total) by mouth 2 (two) times daily. 06/28/15  Yes Sharee Holster, NP  amLODipine (NORVASC) 10 MG tablet Take 10 mg by mouth daily.   Yes Historical Provider, MD  apixaban (  ELIQUIS) 5 MG TABS tablet Take 1 tablet (5 mg total) by mouth 2 (two) times daily. 12/14/14  Yes Richarda Overlie, MD  ferrous sulfate 325 (65 FE) MG tablet Take 325 mg by mouth daily with breakfast.   Yes Historical Provider, MD  ipratropium-albuterol (DUONEB) 0.5-2.5 (3) MG/3ML SOLN Take 3 mLs by nebulization every 6 (six) hours as needed (shortness of breath/wheezing).    Yes Historical Provider, MD  metoprolol tartrate (LOPRESSOR) 25 MG tablet Take 25 mg by mouth daily.   Yes Historical Provider, MD  Multiple Vitamin (MULTIVITAMIN WITH MINERALS) TABS tablet Take 1 tablet by mouth daily.   Yes Historical Provider, MD  potassium chloride SA (K-DUR,KLOR-CON) 20 MEQ tablet Take 20 mEq by mouth every evening.     Yes Historical Provider, MD  traMADol (ULTRAM) 50 MG tablet Take 1 tablet (50 mg total) by mouth every 6 (six) hours as needed. Patient taking differently: Take 50 mg by mouth every 6 (six) hours as needed (pain).  12/14/14  Yes Richarda Overlie, MD  vitamin B-12 1000 MCG tablet Take 1 tablet (1,000 mcg total) by mouth daily. 12/14/14  Yes Richarda Overlie, MD   Physical Exam: Filed Vitals:   01-15-16 1830 01-15-2016 1900 01-15-2016 1915 January 15, 2016 1930  BP: 113/62 117/74 120/64 118/74  Pulse:  118 116 112  Temp:      TempSrc:      Resp: SpO2:  100% 100% 100%     General:  Arouses to voice,  NAD but he is disoriented.  Recognizes his daughter and knows that he is in a hospital but cannot tell me day, year, situation, or POTUS.  Head: normocephalic, small abrasion to tip of nose  Eyes: pupils equal bilaterally but sluggish  ENT: Mucous membranes are slightly dry.  No nasal drainage.  Cardiovascular: Tachycardic and irregular.  Respiratory: No significant wheeze or ronchi.  GI: Abdomen is soft/NT/ND.  Bowel sounds are present.  No guarding.  Skin: Warm and dry.  Musculoskeletal: Moves all four extremities spontaneously though left side appears weaker than right with decreased muscle tone.  Psychiatric: Normal affect.  Neurologic: Left sided weakness  Labs on Admission:  Basic Metabolic Panel:  Recent Labs Lab 01-15-2016 1643  NA 139  K 4.5  CL 108  CO2 15*  GLUCOSE 153*  BUN 73*  CREATININE 2.78*  CALCIUM 8.9   Liver Function Tests:  Recent Labs Lab 01-15-16 1643  AST 41  ALT 31  ALKPHOS 92  BILITOT 0.3  PROT 7.1  ALBUMIN 2.5*   CBC:  Recent Labs Lab 2016/01/15 1643  WBC 31.2*  NEUTROABS 29.7*  HGB 10.8*  HCT 32.7*  MCV 81.8  PLT 151   Radiological Exams on Admission: Dg Chest Port 1 View  2016-01-15  CLINICAL DATA:  Sepsis. EXAM: PORTABLE CHEST 1 VIEW COMPARISON:  03/11/2015 and chest CT dated 03/04/2015. FINDINGS: Stable enlarged cardiac  silhouette. Mildly prominent interstitial markings. Normal vascularity. No pleural fluid. Diffuse osteopenia. Atheromatous arterial calcifications. IMPRESSION: No acute abnormality. Cardiomegaly and chronic interstitial lung disease. Electronically Signed   By: Beckie Salts M.D.   On: 15-Jan-2016 17:25   EKG: Independently reviewed. Initial EKG showed atrial flutter.  Current rhythm appears irregularly irregular with PVCs, consistent with history of chronic atrial fibrillation.  Assessment/Plan Principal Problem:   Sepsis (HCC) Active Problems:   Left-sided weakness   Cerebral infarction due to embolism of right middle cerebral artery (HCC)   Chronic anticoagulation   Essential hypertension  UTI (lower urinary tract infection)   Atrial fibrillation with RVR (HCC)   Acute encephalopathy   AKI (acute kidney injury) (HCC)  Admit to step down unit, telemetry, continuous pulse oximetry  Sepsis secondary to UTI with AKI and urinary retention present on admission --Agree with volume resuscitation per protocol.  Continue NS at maintenance rate of 100cc/hr when the initial bolus is done --Agree with empiric antibiotic coverage (cefepime and vancomycin) per protocol.  Will add one time dose of fluconazole for yeast present on U/A --Blood and urine cultures pending --Lactic acid elevated, follow trend --Procalcitonin ordered with admission labs --Repeat CBC and BMP in AM to follow WBC, bicarb, and BUN/Cr --Hold home potassium supplement due to renal insufficiency for now  Left sided weakness, not sure if this is new, he has had right sided strokes in the past.  --Could be due to acute illness.  Will order noncontrasted head CT to screen for new stroke now.  Chronic atrial fibrillation, anticoagulated with Eliquis --Plan to resume Eliquis in AM (hopefully patient will be tolerating PO intake by then); may need pharmacy to address dose (or even switch to lovenox) in the morning depending on renal  function --PRN IV cardizem for HR greater than 120; can go to cardizem drip if need but HR improving in the ED with volume resuscitation.  Continue home dose of metoprolol when tolerating PO.  AKI with metabolic acidosis --Expect improvement with hydration, repeat BMP in AM --Need foley catheter for acute urinary retention (at least short term)  HTN --Patient currently normotensive.  Hold amlodipine due to sepsis.  Will continue metoprolol since it is also needed for rate control.  Code Status: DNR Family Communication: Daughter at bedside Disposition Plan: Expect he will be here until Monday, at least  Time spent: 60 minutes  Constellation BrandsCarter,Jerry [name] Harrison [facility]  November 09, 2015, 7:43 PM

## 2016-01-07 NOTE — Progress Notes (Signed)
Pharmacy Antibiotic Note  Jerry [name] is a 82 y.o. male admitted on 01/18/2016 with sepsis.  Presented from nursing home with WBC of 44.6, tachypneic and in AFib.  Patient has penicillin allergy, but has received ceftriaxone in the past.  Pharmacy has been consulted for Vancomycin and Cefepime dosing.  On Admit: Febrile (100.7), HR 130 (AFib), RR 31, WBC 31.2 LA 3.93 SCr 2.78 (CrCl 16.2)  Plan: --Vancomycin 1000 mg IV x 1, then 1000 mg IV q48h --Cefepime 2g IV x 1, then 1 g IV q24h --Obtain vanc trough at steady state (goal 15-20) --Follow renal function, clinical course and cultures      Temp (24hrs), Avg:99.6 F (37.6 C), Min:98.4 F (36.9 C), Max:100.7 F (38.2 C)  No results for input(s): WBC, CREATININE, LATICACIDVEN, VANCOTROUGH, VANCOPEAK, VANCORANDOM, GENTTROUGH, GENTPEAK, GENTRANDOM, TOBRATROUGH, TOBRAPEAK, TOBRARND, AMIKACINPEAK, AMIKACINTROU, AMIKACIN in the last 168 hours.  CrCl cannot be calculated (Patient has no serum creatinine result on file.).    Allergies  Allergen Reactions  . Hydrocodone Other (See Comments)    Reaction unknown  . Penicillins Other (See Comments)    REACTION: unknown  . Tetanus Toxoids Other (See Comments)    unknown    Antimicrobials this admission: 4/14 Cefepime >>  4/14 Vanc >>   Dose adjustments this admission:   Microbiology results: 4/14 BCx:  4/14 UCx:      Thank you for allowing pharmacy to be a part of this patient's care.  Kathlynn Gratedam Infant Zink 12/29/2015 5:05 PM

## 2016-01-08 ENCOUNTER — Encounter (HOSPITAL_COMMUNITY): Payer: Self-pay | Admitting: Internal Medicine

## 2016-01-08 DIAGNOSIS — A419 Sepsis, unspecified organism: Secondary | ICD-10-CM

## 2016-01-08 DIAGNOSIS — N390 Urinary tract infection, site not specified: Secondary | ICD-10-CM

## 2016-01-08 DIAGNOSIS — N183 Chronic kidney disease, stage 3 unspecified: Secondary | ICD-10-CM | POA: Diagnosis present

## 2016-01-08 DIAGNOSIS — G9340 Encephalopathy, unspecified: Secondary | ICD-10-CM

## 2016-01-08 DIAGNOSIS — I10 Essential (primary) hypertension: Secondary | ICD-10-CM

## 2016-01-08 DIAGNOSIS — D72829 Elevated white blood cell count, unspecified: Secondary | ICD-10-CM | POA: Diagnosis present

## 2016-01-08 DIAGNOSIS — A499 Bacterial infection, unspecified: Secondary | ICD-10-CM

## 2016-01-08 DIAGNOSIS — I639 Cerebral infarction, unspecified: Secondary | ICD-10-CM | POA: Diagnosis present

## 2016-01-08 DIAGNOSIS — D638 Anemia in other chronic diseases classified elsewhere: Secondary | ICD-10-CM

## 2016-01-08 DIAGNOSIS — N179 Acute kidney failure, unspecified: Secondary | ICD-10-CM | POA: Diagnosis present

## 2016-01-08 DIAGNOSIS — I482 Chronic atrial fibrillation: Secondary | ICD-10-CM

## 2016-01-08 LAB — BASIC METABOLIC PANEL
Anion gap: 11 (ref 5–15)
BUN: 67 mg/dL — ABNORMAL HIGH (ref 6–20)
CHLORIDE: 116 mmol/L — AB (ref 101–111)
CO2: 16 mmol/L — AB (ref 22–32)
CREATININE: 2.4 mg/dL — AB (ref 0.61–1.24)
Calcium: 8.4 mg/dL — ABNORMAL LOW (ref 8.9–10.3)
GFR, EST AFRICAN AMERICAN: 26 mL/min — AB (ref >60)
GFR, EST NON AFRICAN AMERICAN: 22 mL/min — AB (ref >60)
Glucose, Bld: 119 mg/dL — ABNORMAL HIGH (ref 65–99)
Potassium: 4.1 mmol/L (ref 3.5–5.1)
SODIUM: 143 mmol/L (ref 135–145)

## 2016-01-08 LAB — CBC
HCT: 29.7 % — ABNORMAL LOW (ref 39.0–52.0)
Hemoglobin: 9.9 g/dL — ABNORMAL LOW (ref 13.0–17.0)
MCH: 26.9 pg (ref 26.0–34.0)
MCHC: 33.3 g/dL (ref 30.0–36.0)
MCV: 80.7 fL (ref 78.0–100.0)
PLATELETS: 140 10*3/uL — AB (ref 150–400)
RBC: 3.68 MIL/uL — AB (ref 4.22–5.81)
RDW: 14.2 % (ref 11.5–15.5)
WBC: 24.2 10*3/uL — AB (ref 4.0–10.5)

## 2016-01-08 LAB — MRSA PCR SCREENING: MRSA BY PCR: NEGATIVE

## 2016-01-08 MED ORDER — METOPROLOL TARTRATE 1 MG/ML IV SOLN
2.5000 mg | Freq: Four times a day (QID) | INTRAVENOUS | Status: DC | PRN
  Administered 2016-01-09 – 2016-01-10 (×2): 2.5 mg via INTRAVENOUS
  Filled 2016-01-08 (×3): qty 5

## 2016-01-08 MED ORDER — METOPROLOL TARTRATE 1 MG/ML IV SOLN
5.0000 mg | Freq: Four times a day (QID) | INTRAVENOUS | Status: DC
  Administered 2016-01-08 – 2016-01-09 (×3): 5 mg via INTRAVENOUS
  Filled 2016-01-08 (×2): qty 5

## 2016-01-08 MED ORDER — METOPROLOL TARTRATE 1 MG/ML IV SOLN: 5.0000 mg | Freq: Three times a day (TID) | INTRAVENOUS | Status: DC

## 2016-01-08 MED ORDER — FLUCONAZOLE IN SODIUM CHLORIDE 100-0.9 MG/50ML-% IV SOLN
100.0000 mg | INTRAVENOUS | Status: DC
  Administered 2016-01-08 – 2016-01-10 (×3): 100 mg via INTRAVENOUS
  Filled 2016-01-08 (×5): qty 50

## 2016-01-08 MED ORDER — METOPROLOL TARTRATE 1 MG/ML IV SOLN
2.5000 mg | Freq: Three times a day (TID) | INTRAVENOUS | Status: DC
  Administered 2016-01-08: 2.5 mg via INTRAVENOUS
  Filled 2016-01-08: qty 5

## 2016-01-08 NOTE — Progress Notes (Signed)
Heart rate has been in the 120's to 130's, a-fib.  Pt received metoprolol at 1445.  Unable to give the PRN med due to frequency.  Will continue to monitor.

## 2016-01-08 NOTE — Progress Notes (Signed)
CRITICAL VALUE ALERT  Critical value received:  Gram+ Cocci in chains in both bottles  Date of notification:  01/08/2016  Time of notification:  0625  Critical value read back:Yes.    Nurse who received alert:  Kristeen Misshris Mckay Brandt RN  MD notified (1st page):  M. Burnadette PeterLynch NP  Time of first page:  0625  MD notified (2nd page):  Time of second page:  Responding MD:  M. Burnadette PeterLynch NP  Time MD responded:  [phone]

## 2016-01-08 NOTE — Progress Notes (Signed)
Patient ID: Jerry [name], male   DOB: 03/23/1924, 82 y.o.   MRN: [id number]  PROGRESS NOTE    ERICK MURIN  NVB:[id number] DOB: [birth year] DOA: 12/27/2015  PCP: [name], DO  Outpatient Specialists:   Brief Narrative:  82 y.o. male with past medical history of CVA (multiple prior infarcts, hospitalized in June 2016 for acute CVA), HTN, chronic atrial fibrillation (anticoagulated with Eliquis) who presented from [facility] for evaluation of worsening mental status changes since few days prior to this admission. No respiratory distress. No Vomiting. He is in a wheelchair most days due to fall risk. In SNF, he spiked a fever and fell from wheelchair. On admission, T max was 100.7 F, BP 914/45, HR 130, RR 31, oxygen saturation was 95% on room air. Blood work showed WBC count of 31.2, hemoglobin was 10.8, platelets 150 -->140, creatinine 2.78, INR 2.61, lactic acid 3.93, procalcitonin 4.21. CXR showed no acute cardiopulmonary process. CT head showed no acute intracranial findings. UA showed large leukocytes and many bacteria and yeast. He was started on vanco, cefepime for sepsis due to UTI.    Assessment & Plan:   Principal Problem:   Sepsis secondary to UTI ([place]) / Leukocytosis - Sepsis criteria met on admission with fever, hypotension, tachycardia, tachypnea, lactic acidosis and elevated procalcitonin level - Source of infection is UTI - UA on admission showed large leukocytes and many bacteria  - Urine culture pending - Pt on vanco and cefepime - Add fluconazole for yeast uti  Active Problems:   Acute encephalopathy - Likely due to history of stroke  - No significant changes in mental status since admission    Gram-positive bacteremia  - Gram positive cocci in chains on prelim blood cx reports - Follow up final report - Continue cefepime and vanco     Essential hypertension - Antihypertensives on hold due to soft BP    Chronic atrial fibrillation (HCC) - CHADS vasc  score 4 - AC on hold due to coaglopathy and thrombocytopenia - Rate controlled with metoprolol    Protein-calorie malnutrition, severe (HCC) - In the context of chronic illness    Anemia of chronic disease - Due to CKD - Hemoglobin 9.9, stable     Acute renal failure superimposed on stage 3 chronic kidney disease (HCC) - Baseline Cr 10 months ago 1.35 - Cr on this admission elevated at 2.78    CVA (cerebrovascular accident) ([place]) - AC on hold due to risk of bleed   DVT prophylaxis: SCD's bilaterally  Code Status: DNR/DNI  Family Communication: no family at the bedside  Disposition Plan: remains in SDU due to sepsis   Consultants:   None   Procedures:   None   Antimicrobials:   Vanco and cefepime 01/08/2016 -->   Subjective: No respiratory distress.   Objective: Filed Vitals:   12/31/2015 2030 01/08/16 0000 01/08/16 0534 01/08/16 0700  BP: 108/63 122/62  115/52  Pulse:  119  125  Temp:   100.2 F (37.9 C)   TempSrc:   Axillary   Resp: 27 25  37  Weight:   66.906 kg (147 lb 8 oz)   SpO2:  100%  100%    Intake/Output Summary (Last 24 hours) at 01/08/16 0807 Last data filed at 01/08/16 0710  Gross per 24 hour  Intake   2103 ml  Output   1550 ml  Net    553 ml   Filed Weights   01/08/16 0534  Weight: 66.906 kg (  147 lb 8 oz)    Examination:  General exam: Appears calm and comfortable  Respiratory system: Clear to auscultation. Respiratory effort normal. Cardiovascular system: S1 & S2 heard, tachycardic. No JVD, murmurs, rubs, gallops or clicks. No pedal edema. Gastrointestinal system: Abdomen is nondistended, soft and nontender. No organomegaly or masses felt. Normal bowel sounds heard. Central nervous system: Sleeping. No focal neurological deficits. Extremities: Symmetric 5 x 5 power. Skin: No rashes, lesions or ulcers Psychiatry: Judgement and insight appear normal. Mood & affect appropriate.   Data Reviewed: I have personally reviewed following  labs and imaging studies  CBC:  Recent Labs Lab 01/06/2016 1643 01/08/16 0458  WBC 31.2* 24.2*  NEUTROABS 29.7*  --   HGB 10.8* 9.9*  HCT 32.7* 29.7*  MCV 81.8 80.7  PLT 151 099*   Basic Metabolic Panel:  Recent Labs Lab 01/19/2016 1643 01/08/16 0458  NA 139 143  K 4.5 4.1  CL 108 116*  CO2 15* 16*  GLUCOSE 153* 119*  BUN 73* 67*  CREATININE 2.78* 2.40*  CALCIUM 8.9 8.4*   GFR: Estimated Creatinine Clearance: 19.5 mL/min (by C-G formula based on Cr of 2.4). Liver Function Tests:  Recent Labs Lab 01/16/2016 1643  AST 41  ALT 31  ALKPHOS 92  BILITOT 0.3  PROT 7.1  ALBUMIN 2.5*   No results for input(s): LIPASE, AMYLASE in the last 168 hours. No results for input(s): AMMONIA in the last 168 hours. Coagulation Profile:  Recent Labs Lab 01/12/2016 2100  INR 2.61*   Cardiac Enzymes: No results for input(s): CKTOTAL, CKMB, CKMBINDEX, TROPONINI in the last 168 hours. BNP (last 3 results) No results for input(s): PROBNP in the last 8760 hours. HbA1C: No results for input(s): HGBA1C in the last 72 hours. CBG: No results for input(s): GLUCAP in the last 168 hours. Lipid Profile: No results for input(s): CHOL, HDL, LDLCALC, TRIG, CHOLHDL, LDLDIRECT in the last 72 hours. Thyroid Function Tests: No results for input(s): TSH, T4TOTAL, FREET4, T3FREE, THYROIDAB in the last 72 hours. Anemia Panel: No results for input(s): VITAMINB12, FOLATE, FERRITIN, TIBC, IRON, RETICCTPCT in the last 72 hours. Urine analysis:    Component Value Date/Time   COLORURINE YELLOW 12/27/2015 1753   APPEARANCEUR TURBID* 12/31/2015 1753   LABSPEC 1.013 01/12/2016 1753   PHURINE 6.0 01/23/2016 1753   GLUCOSEU NEGATIVE 01/06/2016 1753   HGBUR LARGE* 01/21/2016 1753   BILIRUBINUR NEGATIVE 01/10/2016 1753   [place] 01/03/2016 1753   PROTEINUR 100* 01/21/2016 1753   UROBILINOGEN 0.2 03/11/2015 1829   NITRITE NEGATIVE 01/04/2016 1753   LEUKOCYTESUR LARGE* 01/10/2016 1753    Sepsis Labs: _0 (procalcitonin:4,lacticidven:4)  ) Recent Results (from the past 240 hour(s))  MRSA PCR Screening     Status: None   Collection Time: 01/03/2016  8:30 AM  Result Value Ref Range Status   MRSA by PCR NEGATIVE NEGATIVE Final    Comment:        The GeneXpert MRSA Assay (FDA approved for NASAL specimens only), is one component of a comprehensive MRSA colonization surveillance program. It is not intended to diagnose MRSA infection nor to guide or monitor treatment for MRSA infections.   Culture, blood (routine x 2)     Status: None (Preliminary result)   Collection Time: 12/26/2015  4:43 PM  Result Value Ref Range Status   Specimen Description BLOOD LEFT ANTECUBITAL  Final   Special Requests BOTTLES DRAWN AEROBIC AND ANAEROBIC 4CC  Final   Culture  Setup Time   Final  GRAM POSITIVE COCCI IN CHAINS IN BOTH AEROBIC AND ANAEROBIC BOTTLES CRITICAL RESULT CALLED TO, READ BACK BY AND VERIFIED WITH: C WOODARD,RN _0  01/08/16 MKELLY    Culture PENDING  Incomplete   Report Status PENDING  Incomplete  Culture, blood (routine x 2)     Status: None (Preliminary result)   Collection Time: 01/12/2016  5:10 PM  Result Value Ref Range Status   Specimen Description BLOOD RIGHT FOREARM  Final   Special Requests BOTTLES DRAWN AEROBIC AND ANAEROBIC 4CC  Final   Culture  Setup Time   Final    GRAM POSITIVE COCCI IN CHAINS IN BOTH AEROBIC AND ANAEROBIC BOTTLES CRITICAL RESULT CALLED TO, READ BACK BY AND VERIFIED WITH: C WOODARD,RN _1  01/08/16 MKELLY    Culture PENDING  Incomplete   Report Status PENDING  Incomplete      Radiology Studies: Ct Head Wo Contrast 01/15/2016  1. No acute intracranial pathology seen on CT. 2. Moderate cortical volume loss and diffuse small vessel ischemic microangiopathy. 3. Chronic infarct at the right temporal lobe and basal ganglia, with associated encephalomalacia.   Dg Chest Port 1 View 12/26/2015  No acute abnormality. Cardiomegaly  and chronic interstitial lung disease.    Scheduled Meds: . ceFEPime (MAXIPIME) IV  1 g Intravenous Q24H  . metoprolol tartrate  25 mg Oral Daily  . sodium chloride flush  3 mL Intravenous Q12H  . [START ON 01/09/2016] vancomycin  1,000 mg Intravenous Q48H   Continuous Infusions: . sodium chloride 100 mL/hr at 01/08/16 0048     LOS: 1 day    Time spent: 25 minutes   Leisa Lenz, MD [facility] Pager [phone]  If 7PM-7AM, please contact night-coverage [email or web address] Password [email or web address] 01/08/2016, 8:07 AM

## 2016-01-09 LAB — URINE CULTURE: Culture: >=100000 — AB

## 2016-01-09 MED ORDER — MORPHINE SULFATE (PF) 2 MG/ML IV SOLN
2.0000 mg | Freq: Once | INTRAVENOUS | Status: AC
  Administered 2016-01-09: 2 mg via INTRAVENOUS
  Filled 2016-01-09: qty 1

## 2016-01-09 MED ORDER — MORPHINE SULFATE (PF) 2 MG/ML IV SOLN
2.0000 mg | INTRAVENOUS | Status: DC | PRN
  Administered 2016-01-09 – 2016-01-10 (×7): 2 mg via INTRAVENOUS
  Filled 2016-01-09 (×7): qty 1

## 2016-01-09 NOTE — Progress Notes (Signed)
Patient ID: Jerry [name], male   DOB: 03/16/1924, 82 y.o.   MRN: [id number]  PROGRESS NOTE    Jerry [name]  FFM:[id number] DOB: 10-08-1923 DOA: 12/30/2015  PCP: [name], DO  Outpatient Specialists:   Brief Narrative:  82 y.o. male with past medical history of CVA (multiple prior infarcts, hospitalized in June 2016 for acute CVA), HTN, chronic atrial fibrillation (anticoagulated with Eliquis) who presented from [facility] for evaluation of worsening mental status changes since few days prior to this admission. No respiratory distress. No Vomiting. He is in a wheelchair most days due to fall risk. In SNF, he spiked a fever and fell from wheelchair. On admission, T max was 100.7 F, BP 914/45, HR 130, RR 31, oxygen saturation was 95% on room air. Blood work showed WBC count of 31.2, hemoglobin was 10.8, platelets 150 -->140, creatinine 2.78, INR 2.61, lactic acid 3.93, procalcitonin 4.21. CXR showed no acute cardiopulmonary process. CT head showed no acute intracranial findings. UA showed large leukocytes and many bacteria and yeast. He was started on vanco, cefepime for sepsis due to UTI.    Assessment & Plan:   Principal Problem:   Sepsis secondary to UTI ([place]) / Leukocytosis - Sepsis criteria met on admission with fever, hypotension, tachycardia, tachypnea, lactic acidosis and elevated procalcitonin level - Presumed source of sepsis is UTI and bacteremia - Pt continues to be septic at this point with fever, tachycardia, tachypnea, hypoxia, leukocytosis - Final reports of blood and urine cultures are pending - Continue vanco and cefepime - Continue fluconazole for yeast uti - Palliative care consulted for goals of care  Active Problems:   Acute encephalopathy - Likely due to history of stroke  - Stable, more alert this am    Gram-positive bacteremia - Gram positive cocci in chains on prelim blood cx reports - Final report is pending - Continue cefepime and vanco    Essential hypertension - Antihypertensives on hold due to soft BP - Monitor on telemetry     Chronic atrial fibrillation (HCC) - CHADS vasc score 4 - AC on hold due to coaglopathy and thrombocytopenia - Rate controlled with metoprolol (placed as needed order since BP on soft side)    Protein-calorie malnutrition, severe (HCC) - In the context of chronic illness - More alert this am, so diet as tolerated     Anemia of chronic disease - Due to CKD - Hemoglobin 9.9, stable     Acute renal failure superimposed on stage 3 chronic kidney disease (HCC) - Baseline Cr 10 months ago 1.35 - Cr on this admission elevated at 2.78 but improving slowly, 2.40     CVA (cerebrovascular accident) ([place]) - AC on hold due to risk of bleed, thrombocytopenia    DVT prophylaxis: SCD's bilaterally  Code Status: DNR/DNI  Family Communication: no family at the bedside  Disposition Plan: remains in SDU due to sepsis   Consultants:   Palliative care   Procedures:   None   Antimicrobials:   Vanco and cefepime 01/08/2016 -->   Subjective: Awake, not in respiratory distress.   Objective: Filed Vitals:   01/09/16 0439 01/09/16 0500 01/09/16 0600 01/09/16 0700  BP: 117/72 116/71 137/98 96/56  Pulse: 114 128 52 57  Temp: 98.6 F (37 C)   97.7 F (36.5 C)  TempSrc: Axillary   Axillary  Resp: _0 Weight:      SpO2: 91% 83% 100% 100%    Intake/Output Summary (Last 24  hours) at 01/09/16 0805 Last data filed at 01/09/16 0700  Gross per 24 hour  Intake   1443 ml  Output   1250 ml  Net    193 ml   Filed Weights   01/08/16 0534  Weight: 66.906 kg (147 lb 8 oz)    Examination:  General exam: Appears calm, has venti mask, more alert Respiratory system: coarse breath sounds, gurgling, no wheezing  Cardiovascular system: S1 & S2 appreciate, tachycardic, no murmurs  Gastrointestinal system: appreciate bowel sounds, non tender, non distended  Central nervous system: More alert,  non focal  Extremities: no cyanosis, no leg swelling, palpable pulses  Skin: warm, dry Psychiatry: normal mood, behavior, no restless and not agitated   Data Reviewed: I have personally reviewed following labs and imaging studies  CBC:  Recent Labs Lab 01/18/2016 1643 01/08/16 0458  WBC 31.2* 24.2*  NEUTROABS 29.7*  --   HGB 10.8* 9.9*  HCT 32.7* 29.7*  MCV 81.8 80.7  PLT 151 979*   Basic Metabolic Panel:  Recent Labs Lab 01/14/2016 1643 01/08/16 0458  NA 139 143  K 4.5 4.1  CL 108 116*  CO2 15* 16*  GLUCOSE 153* 119*  BUN 73* 67*  CREATININE 2.78* 2.40*  CALCIUM 8.9 8.4*   GFR: Estimated Creatinine Clearance: 19.5 mL/min (by C-G formula based on Cr of 2.4). Liver Function Tests:  Recent Labs Lab 12/26/2015 1643  AST 41  ALT 31  ALKPHOS 92  BILITOT 0.3  PROT 7.1  ALBUMIN 2.5*   No results for input(s): LIPASE, AMYLASE in the last 168 hours. No results for input(s): AMMONIA in the last 168 hours. Coagulation Profile:  Recent Labs Lab 12/29/2015 2100  INR 2.61*   Cardiac Enzymes: No results for input(s): CKTOTAL, CKMB, CKMBINDEX, TROPONINI in the last 168 hours. BNP (last 3 results) No results for input(s): PROBNP in the last 8760 hours. HbA1C: No results for input(s): HGBA1C in the last 72 hours. CBG: No results for input(s): GLUCAP in the last 168 hours. Lipid Profile: No results for input(s): CHOL, HDL, LDLCALC, TRIG, CHOLHDL, LDLDIRECT in the last 72 hours. Thyroid Function Tests: No results for input(s): TSH, T4TOTAL, FREET4, T3FREE, THYROIDAB in the last 72 hours. Anemia Panel: No results for input(s): VITAMINB12, FOLATE, FERRITIN, TIBC, IRON, RETICCTPCT in the last 72 hours. Urine analysis:    Component Value Date/Time   COLORURINE YELLOW 01/03/2016 1753   APPEARANCEUR TURBID* 12/31/2015 1753   LABSPEC 1.013 01/16/2016 1753   PHURINE 6.0 01/09/2016 1753   GLUCOSEU NEGATIVE 01/23/2016 1753   HGBUR LARGE* 01/10/2016 1753   BILIRUBINUR  NEGATIVE 01/21/2016 1753   [place] 01/03/2016 1753   PROTEINUR 100* 01/16/2016 1753   UROBILINOGEN 0.2 03/11/2015 1829   NITRITE NEGATIVE 01/10/2016 1753   LEUKOCYTESUR LARGE* 01/18/2016 1753   Sepsis Labs: _0 (procalcitonin:4,lacticidven:4)  ) Recent Results (from the past 240 hour(s))  MRSA PCR Screening     Status: None   Collection Time: 01/20/2016  8:30 AM  Result Value Ref Range Status   MRSA by PCR NEGATIVE NEGATIVE Final    Comment:        The GeneXpert MRSA Assay (FDA approved for NASAL specimens only), is one component of a comprehensive MRSA colonization surveillance program. It is not intended to diagnose MRSA infection nor to guide or monitor treatment for MRSA infections.   Culture, blood (routine x 2)     Status: None (Preliminary result)   Collection Time: 12/31/2015  4:43 PM  Result Value Ref  Range Status   Specimen Description BLOOD LEFT ANTECUBITAL  Final   Special Requests BOTTLES DRAWN AEROBIC AND ANAEROBIC 4CC  Final   Culture  Setup Time   Final    GRAM POSITIVE COCCI IN CHAINS IN BOTH AEROBIC AND ANAEROBIC BOTTLES CRITICAL RESULT CALLED TO, READ BACK BY AND VERIFIED WITH: C WOODARD,RN _0  01/08/16 MKELLY    Culture TOO YOUNG TO READ  Final   Report Status PENDING  Incomplete  Culture, blood (routine x 2)     Status: None (Preliminary result)   Collection Time: 12/27/2015  5:10 PM  Result Value Ref Range Status   Specimen Description BLOOD RIGHT FOREARM  Final   Special Requests BOTTLES DRAWN AEROBIC AND ANAEROBIC 4CC  Final   Culture  Setup Time   Final    GRAM POSITIVE COCCI IN CHAINS IN BOTH AEROBIC AND ANAEROBIC BOTTLES CRITICAL RESULT CALLED TO, READ BACK BY AND VERIFIED WITH: C WOODARD,RN _1  01/08/16 MKELLY    Culture TOO YOUNG TO READ  Final   Report Status PENDING  Incomplete  Urine culture     Status: None (Preliminary result)   Collection Time: 01/10/2016  5:53 PM  Result Value Ref Range Status   Specimen  Description URINE, CATHETERIZED  Final   Special Requests NONE  Final   Culture TOO YOUNG TO READ  Final   Report Status PENDING  Incomplete      Radiology Studies: Ct Head Wo Contrast 12/25/2015  1. No acute intracranial pathology seen on CT. 2. Moderate cortical volume loss and diffuse small vessel ischemic microangiopathy. 3. Chronic infarct at the right temporal lobe and basal ganglia, with associated encephalomalacia.   Dg Chest Port 1 View 12/25/2015  No acute abnormality. Cardiomegaly and chronic interstitial lung disease.    Scheduled Meds: . ceFEPime (MAXIPIME) IV  1 g Intravenous Q24H  . fluconazole (DIFLUCAN) IV  100 mg Intravenous Q24H  . sodium chloride flush  3 mL Intravenous Q12H  . vancomycin  1,000 mg Intravenous Q48H   Continuous Infusions: . sodium chloride 50 mL/hr at 01/08/16 2000     LOS: 2 days    Time spent: 25 minutes   Leisa Lenz, MD [facility] Pager [phone]  If 7PM-7AM, please contact night-coverage [email or web address] Password [email or web address] 01/09/2016, 8:05 AM

## 2016-01-10 DIAGNOSIS — Z515 Encounter for palliative care: Secondary | ICD-10-CM

## 2016-01-10 LAB — CULTURE, BLOOD (ROUTINE X 2)

## 2016-01-10 LAB — CBC
HCT: 31.9 % — ABNORMAL LOW (ref 39.0–52.0)
Hemoglobin: 10.5 g/dL — ABNORMAL LOW (ref 13.0–17.0)
MCH: 26.7 pg (ref 26.0–34.0)
MCHC: 32.9 g/dL (ref 30.0–36.0)
MCV: 81.2 fL (ref 78.0–100.0)
PLATELETS: 140 10*3/uL — AB (ref 150–400)
RBC: 3.93 MIL/uL — ABNORMAL LOW (ref 4.22–5.81)
RDW: 14.8 % (ref 11.5–15.5)
WBC: 26.6 10*3/uL — AB (ref 4.0–10.5)

## 2016-01-10 LAB — BASIC METABOLIC PANEL
ANION GAP: 14 (ref 5–15)
BUN: 93 mg/dL — ABNORMAL HIGH (ref 6–20)
CALCIUM: 8.4 mg/dL — AB (ref 8.9–10.3)
CO2: 13 mmol/L — ABNORMAL LOW (ref 22–32)
Chloride: 126 mmol/L — ABNORMAL HIGH (ref 101–111)
Creatinine, Ser: 2.92 mg/dL — ABNORMAL HIGH (ref 0.61–1.24)
GFR, EST AFRICAN AMERICAN: 20 mL/min — AB (ref >60)
GFR, EST NON AFRICAN AMERICAN: 18 mL/min — AB (ref >60)
GLUCOSE: 107 mg/dL — AB (ref 65–99)
Potassium: 4.2 mmol/L (ref 3.5–5.1)
Sodium: 153 mmol/L — ABNORMAL HIGH (ref 135–145)

## 2016-01-10 MED ORDER — POLYVINYL ALCOHOL 1.4 % OP SOLN
1.0000 [drp] | Freq: Four times a day (QID) | OPHTHALMIC | Status: DC | PRN
  Filled 2016-01-10: qty 15

## 2016-01-10 MED ORDER — BIOTENE DRY MOUTH MT LIQD: 15.0000 mL | OROMUCOSAL | Status: DC | PRN

## 2016-01-10 MED ORDER — MORPHINE SULFATE (PF) 2 MG/ML IV SOLN
1.0000 mg | INTRAVENOUS | Status: DC | PRN
  Administered 2016-01-10 – 2016-01-11 (×6): 2 mg via INTRAVENOUS
  Filled 2016-01-10 (×6): qty 1

## 2016-01-10 MED ORDER — DEXTROSE 5 % IV SOLN
2.0000 g | Freq: Two times a day (BID) | INTRAVENOUS | Status: DC
  Administered 2016-01-10 – 2016-01-11 (×3): 2 g via INTRAVENOUS
  Filled 2016-01-10 (×4): qty 2

## 2016-01-10 MED ORDER — HALOPERIDOL LACTATE 5 MG/ML IJ SOLN: 1.0000 mg | Freq: Four times a day (QID) | INTRAMUSCULAR | Status: DC | PRN

## 2016-01-10 MED ORDER — GLYCOPYRROLATE 0.2 MG/ML IJ SOLN
0.2000 mg | INTRAMUSCULAR | Status: DC | PRN
  Administered 2016-01-10: 0.2 mg via INTRAVENOUS
  Filled 2016-01-10 (×3): qty 1

## 2016-01-10 MED ORDER — GLYCOPYRROLATE 0.2 MG/ML IJ SOLN
0.2000 mg | Freq: Once | INTRAMUSCULAR | Status: AC
  Administered 2016-01-10: 0.2 mg via INTRAVENOUS
  Filled 2016-01-10: qty 1

## 2016-01-10 NOTE — Consult Note (Signed)
Consultation Note Date: 01/10/2016   Patient Name: Jerry [name]  DOB: 05-30-1924  MRN: [id number]  Age / Sex: 82 y.o., male  PCP: Kirt Boys, DO Referring Physician: Alison Murray, MD  Reason for Consultation: Establishing goals of care  82 yo male from SNF with dementia, h/o CVAs, and admitted with AMS and sepsis r/t UTI and possible bacteremia.   Clinical Assessment/Narrative: Mr. Flett is much unresponsive except for opening his eyes but did not track. Lungs sound very coarse, labored breathing, gurgling, tachycardic, tachypneic, hypotensive. He appears to be approaching EOL. I called his family and expressed my concern to daughter-in-law Jerry [name] who will notify her husband and the rest of their family.   I spoke with son, Jerry [name], and his wife at bedside regarding poor prognosis and concern that he will not live long. Jerry [name] expresses to me that he has had conversations with his siblings and that their main concern is that he be comfortable and "rest in peace." Jerry [name] explains how his father has overcome so much and that he has surprised them in the past when they thought he was going to die. They say that he has been declining over the past few months. He would not always know who he was speaking with but continued to joke with them as always up until this illness ~1 week ago. The priority is comfort care and I encourage anyone that wishes to visit with him do so as I fear he does not have long left. We agree to d/c fluids but continue antibiotics for now but also allow morphine to ensure comfort regardless of vital signs. Jerry [name] expresses that his father has had a long and wonderful life and has lived the way he wanted to live. Emotional support provided. All questions/concerns addressed. Contact information given for any other family members that have questions/concerns. I will continue to follow.    Contacts/Participants in  Discussion: Primary Decision Maker: Multiple children - discussed with Phil Camp  Relationship to Patient son  SUMMARY OF RECOMMENDATIONS - COMFORT is priority - Continue antibiotics for now - Do not escalate care  Code Status/Advance Care Planning: DNR    Code Status Orders        Start     Ordered   Feb 05, 2016 2027  Do not attempt resuscitation (DNR)   Continuous    Question Answer Comment  In the event of cardiac or respiratory ARREST Do not call a "code blue"   In the event of cardiac or respiratory ARREST Do not perform Intubation, CPR, defibrillation or ACLS   In the event of cardiac or respiratory ARREST Use medication by any route, position, wound care, and other measures to relive pain and suffering. May use oxygen, suction and manual treatment of airway obstruction as needed for comfort.      02/05/16 2026    Code Status History    Date Active Date Inactive Code Status Order ID Comments User Context   02-05-2016  5:16 PM Feb 05, 2016  8:26 PM DNR [id number]  Vanetta Mulders, MD ED   03/11/2015 10:30 PM 03/19/2015  6:26 PM DNR [id number]  Eduard Clos, MD Inpatient   03/11/2015  9:11 PM 03/11/2015 10:30 PM DNR [id number]  Doug Sou, MD ED   12/11/2014  7:43 PM 12/14/2014  5:11 PM Full Code [id number]  Lorretta Harp, MD Inpatient   09/15/2013  2:56 PM 09/17/2013  2:36 PM Full Code [id number]  Naiping Glee Arvin, MD Inpatient    Advance Directive Documentation  Most Recent Value   Type of Advance Directive  Healthcare Power of Attorney, Living will, Out of facility DNR (pink MOST or yellow form)   Pre-existing out of facility DNR order (yellow form or pink MOST form)     "MOST" Form in Place?         Symptom Management:   Pain/dyspnea: Morphine 1-2 mg every 30 min prn.   Secretions: Robinul 0.2 mg IV once and then every 4 hours prn.   Agitation: Haldol 1-2 mg every 6 hours prn.   Palliative Prophylaxis:   Delirium Protocol, Frequent Pain Assessment, Oral Care  and Turn Reposition  Additional Recommendations (Limitations, Scope, Preferences):  Full Comfort Care  Psycho-social/Spiritual:  Support System: Adequate Desire for further Chaplaincy support:no Additional Recommendations: Caregiving  Support/Resources and Grief/Bereavement Support  Prognosis: Hours - Days  Discharge Planning: Anticipated Hospital Death   Chief Complaint/ Primary Diagnoses: Present on Admission:  . Essential hypertension . Acute encephalopathy . Chronic atrial fibrillation (HCC) . Protein-calorie malnutrition, severe (HCC) . Acute renal failure superimposed on stage 3 chronic kidney disease (HCC) . Anemia of chronic disease . CVA (cerebrovascular accident) (HCC) . Sepsis secondary to UTI (HCC) . Gram-positive bacteremia . Leukocytosis  I have reviewed the medical record, interviewed the patient and family, and examined the patient. The following aspects are pertinent.  Past Medical History  Diagnosis Date  . Hypertension   . Arthritis   . Hernia   . Dysrhythmia     atrial fibrillation  . Head injury   . Humerus fracture     right  . Asthma     as a child  . Pneumonia   . Stroke [facility])     "light stroke"  . H/O hiatal hernia   . Stroke (HCC) 2016  . A-fib (HCC)   . Back pain   . Chronic atrial fibrillation (HCC) 03/11/2015  . AKI (acute kidney injury) (HCC) 12/26/2015  . Cerebral infarction due to embolism of right middle cerebral artery (HCC) 02/08/2015   Social History   Social History  . Marital Status: Widowed    Spouse Name: N/A  . Number of Children: 5  . Years of Education: 5   Occupational History  . retired     Music therapist, Holiday representative   Social History Main Topics  . Smoking status: Never Smoker   . Smokeless tobacco: Former Neurosurgeon    Types: Chew  . Alcohol Use: Yes     Comment: heavy drinker  . Drug Use: None  . Sexual Activity: Not Asked   Other Topics Concern  . None   Social History Narrative   02/08/15 currently  living at [facility], lived with son, widowed      Right handed      Caffeine use- yes, but unsure of amount per day      Family History  Problem Relation Age of Onset  . Heart attack Brother    Scheduled Meds: . ceFEPime (MAXIPIME) IV  1 g Intravenous Q24H  . fluconazole (DIFLUCAN) IV  100 mg Intravenous Q24H  . sodium chloride flush  3 mL Intravenous Q12H  . vancomycin  1,000 mg Intravenous Q48H   Continuous Infusions: . sodium chloride 50 mL/hr at 01/09/16 1952   PRN Meds:.acetaminophen **OR** acetaminophen, ipratropium-albuterol, metoprolol, morphine injection, ondansetron **OR** ondansetron (ZOFRAN) IV Medications Prior to Admission:  Prior to Admission medications   Medication Sig Start Date End Date Taking? Authorizing Provider  acetaminophen (TYLENOL) 325 MG tablet Take 2 tablets (650  mg total) by mouth 2 (two) times daily. Patient taking differently: Take 650 mg by mouth See admin instructions. Take 2 tablets (650 mg) by mouth twice daily, may also take 2 tablets (650 mg) every 6 hours as needed for fever/pain 06/28/15  Yes Sharee Holster, NP  Amino Acids-Protein Hydrolys (FEEDING SUPPLEMENT, PRO-STAT SUGAR FREE 64,) LIQD Take 30 mLs by mouth 2 (two) times daily.   Yes Historical Provider, MD  amLODipine (NORVASC) 10 MG tablet Take 10 mg by mouth daily.   Yes Historical Provider, MD  apixaban (ELIQUIS) 5 MG TABS tablet Take 1 tablet (5 mg total) by mouth 2 (two) times daily. 12/14/14  Yes Richarda Overlie, MD  ferrous sulfate 325 (65 FE) MG tablet Take 325 mg by mouth daily with breakfast.   Yes Historical Provider, MD  ipratropium-albuterol (DUONEB) 0.5-2.5 (3) MG/3ML SOLN Take 3 mLs by nebulization every 6 (six) hours as needed (shortness of breath/wheezing).    Yes Historical Provider, MD  metoprolol tartrate (LOPRESSOR) 25 MG tablet Take 25 mg by mouth daily.   Yes Historical Provider, MD  Multiple Vitamin (MULTIVITAMIN WITH MINERALS) TABS tablet Take 1  tablet by mouth daily.   Yes Historical Provider, MD  NUTRITIONAL SUPPLEMENTS PO Take 90 mLs by mouth 2 (two) times daily. "MedPass"   Yes Historical Provider, MD  potassium chloride SA (K-DUR,KLOR-CON) 20 MEQ tablet Take 20 mEq by mouth daily at 6 PM.    Yes Historical Provider, MD  traMADol (ULTRAM) 50 MG tablet Take 1 tablet (50 mg total) by mouth every 6 (six) hours as needed. Patient taking differently: Take 50 mg by mouth every 6 (six) hours as needed (pain).  12/14/14  Yes Richarda Overlie, MD  vitamin B-12 1000 MCG tablet Take 1 tablet (1,000 mcg total) by mouth daily. 12/14/14  Yes Richarda Overlie, MD   Allergies  Allergen Reactions  . Hydrocodone Other (See Comments)    Listed on MAR - unknown reaction  . Penicillins Other (See Comments)    Listed on MAR - unknown reaction  . Tetanus Toxoids Other (See Comments)    Listed on MAR - unknown reaction    Review of Systems  Unable to perform ROS   Physical Exam  Constitutional: He appears well-developed. He appears lethargic.  HENT:  Head: Normocephalic and atraumatic.  Cardiovascular: Tachycardia present.   Respiratory: Accessory muscle usage present. He is in respiratory distress. He has rhonchi. He has rales.  GI: Soft. Normal appearance.  Neurological: He appears lethargic. He is disoriented.    Vital Signs: BP 88/61 mmHg  Pulse 133  Temp(Src) 98 F (36.7 C) (Axillary)  Resp 22  Wt 66.906 kg (147 lb 8 oz)  SpO2 95%  SpO2: SpO2: 95 % O2 Device:SpO2: 95 % O2 Flow Rate: .O2 Flow Rate (L/min): 15 L/min  IO: Intake/output summary:  Intake/Output Summary (Last 24 hours) at 01/10/16 0935 Last data filed at 01/10/16 0600  Gross per 24 hour  Intake   1250 ml  Output    925 ml  Net    325 ml    LBM: Last BM Date: 01/08/16 Baseline Weight: Weight: 66.906 kg (147 lb 8 oz) Most recent weight: Weight: 66.906 kg (147 lb 8 oz)      Palliative Assessment/Data:    Additional Data Reviewed:  CBC:    Component Value  Date/Time   WBC 26.6* 01/10/2016 0337   HGB 10.5* 01/10/2016 0337   HCT 31.9* 01/10/2016 0337   PLT 140* 01/10/2016 4098  MCV 81.2 01/10/2016 0337   NEUTROABS 29.7* 12/29/2015 1643   LYMPHSABS 0.6* 01/04/2016 1643   MONOABS 0.9 01/09/2016 1643   EOSABS 0.0 01/17/2016 1643   BASOSABS 0.0 01/22/2016 1643   Comprehensive Metabolic Panel:    Component Value Date/Time   NA 153* 01/10/2016 0337   NA 139 11/03/2015   K 4.2 01/10/2016 0337   CL 126* 01/10/2016 0337   CO2 13* 01/10/2016 0337   BUN 93* 01/10/2016 0337   BUN 41* 11/03/2015   CREATININE 2.92* 01/10/2016 0337   CREATININE 2.5* 11/03/2015   GLUCOSE 107* 01/10/2016 0337   CALCIUM 8.4* 01/10/2016 0337   AST 41 01/04/2016 1643   ALT 31 01/19/2016 1643   ALKPHOS 92 01/14/2016 1643   BILITOT 0.3 01/22/2016 1643   PROT 7.1 01/22/2016 1643   ALBUMIN 2.5* 01/06/2016 1643     Time In/Out: 0900-0930, 1100-1200 Time Total: 90min Greater than 50%  of this time was spent counseling and coordinating care related to the above assessment and plan.  Signed by: Ulice BoldParker, Athol Bolds C, NP  Ulice BoldAlicia C Madell Heino, NP  01/10/2016, 9:35 AM  Please contact Palliative Medicine Team phone at [phone] for questions and concerns.

## 2016-01-10 NOTE — Progress Notes (Signed)
NP updated about patient status. Labored breathing with tachypnea. Occasionally de-sats to high 70's, low 80's on a NRB mask. HR has sustained afib 120's-140's throughout the night. Prn metoprolol given. Prn morphine given about every three hours. Pt is a DNR. Palliative consulted.  M.Ozella Comins, RN

## 2016-01-10 NOTE — Progress Notes (Signed)
Patient ID: Jerry [name], male   DOB: 01/30/1924, 82 y.o.   MRN: [id number]  PROGRESS NOTE    Jerry [name]  QIH:[id number] DOB: [birth year] DOA: 01/21/2016  PCP: [name], DO  Outpatient Specialists:   Brief Narrative:  82 y.o. male with past medical history of CVA (multiple prior infarcts, hospitalized in June 2016 for acute CVA), HTN, chronic atrial fibrillation (anticoagulated with Eliquis) who presented from [facility] for evaluation of worsening mental status changes since few days prior to this admission. No respiratory distress. No Vomiting. He is in a wheelchair most days due to fall risk. In SNF, he spiked a fever and fell from wheelchair. On admission, T max was 100.7 F, BP 914/45, HR 130, RR 31, oxygen saturation was 95% on room air. Blood work showed WBC count of 31.2, hemoglobin was 10.8, platelets 150 -->140, creatinine 2.78, INR 2.61, lactic acid 3.93, procalcitonin 4.21. CXR showed no acute cardiopulmonary process. CT head showed no acute intracranial findings. UA showed large leukocytes and many bacteria and yeast. He was started on vanco, cefepime for sepsis due to UTI.    Assessment & Plan:   Principal Problem:   Sepsis secondary to UTI ([place]) / Leukocytosis - Sepsis criteria met on admission with fever, hypotension, tachycardia, tachypnea, lactic acidosis and elevated procalcitonin level - Presumed source of sepsis is UTI and bacteremia - He is on vancomycin, cefepime and fluconazole. Fluconazole is for yeast UTI. - Urine culture growing yeast - Final report from blood cultures is pending. Preliminary report shows gram-positive cocci in chains - Appreciate palliative care for goals of care, comfort care  Active Problems:   Acute encephalopathy - Likely due to history of stroke  - patient unresponsive to verbal or painful stimuli this morning    Gram-positive bacteremia - Gram positive cocci in chains on prelim blood cx reports; awaiting final report -  Patient is on vancomycin and cefepime    Essential hypertension - Antihypertensives on hold due to soft BP - Monitor on telemetry     Chronic atrial fibrillation (HCC) - CHADS vasc score 4 - AC on hold due to coaglopathy and thrombocytopenia - held metoprolol because of soft blood pressure.    Protein-calorie malnutrition, severe (HCC) - In the context of chronic illness - Nothing by mouth secondary to his poor mental status this morning    Anemia of chronic disease - Due to CKD - hemoglobin is 10.5, overall stable    Acute renal failure superimposed on stage 3 chronic kidney disease (HCC) - Baseline Cr 10 months ago 1.35 - Creatinine initially improving but then up this morning, 2.92    CVA (cerebrovascular accident) ([place]) - AC on hold due to risk of bleed, thrombocytopenia    DVT prophylaxis: SCD's bilaterally  Code Status: DNR/DNI  Family Communication: no family at the bedside; I tried calling patient's home, patient's son and daughter over the phone to give an update but no response and there was no option on cell phone to leave VM Disposition Plan: remains in SDU due to sepsis   Consultants:   Palliative care   Procedures:   None   Antimicrobials:   Vanco and cefepime 01/08/2016 -->   Subjective: No overnight events. Currently on Ventimask.  Objective: Filed Vitals:   01/10/16 0600 01/10/16 0800 01/10/16 0819 01/10/16 0900  BP: 1'11/65 96/55 87/59 '$ 88/61  Pulse: 133 127 132 133  Temp:   98 F (36.7 C)   TempSrc:   Axillary  Resp: '25 26 30 23  '$ Weight:      SpO2: 91% 94% 94% 96%    Intake/Output Summary (Last 24 hours) at 01/10/16 0910 Last data filed at 01/10/16 0600  Gross per 24 hour  Intake   1250 ml  Output    925 ml  Net    325 ml   Filed Weights   01/08/16 0534  Weight: 66.906 kg (147 lb 8 oz)    Examination:  General exam: patient is not responsive to verbal or painful stimuli Respiratory system: coarse breath sounds, gurgling  appreciated, has Ventimask Cardiovascular system: appreciate S1, S2, tachycardic Gastrointestinal system: appreciate bowel sounds, non tender, non distended  Central nervous system: not responsive, no focal deficits Extremities: no swelling, no cyanosis Skin: warm, dry Psychiatry: unable to test due to patient's mental status  Data Reviewed: I have personally reviewed following labs and imaging studies  CBC:  Recent Labs Lab 12/30/2015 1643 01/08/16 0458 01/10/16 0337  WBC 31.2* 24.2* 26.6*  NEUTROABS 29.7*  --   --   HGB 10.8* 9.9* 10.5*  HCT 32.7* 29.7* 31.9*  MCV 81.8 80.7 81.2  PLT 151 140* 962*   Basic Metabolic Panel:  Recent Labs Lab 12/31/2015 1643 01/08/16 0458 01/10/16 0337  NA 139 143 153*  K 4.5 4.1 4.2  CL 108 116* 126*  CO2 15* 16* 13*  GLUCOSE 153* 119* 107*  BUN 73* 67* 93*  CREATININE 2.78* 2.40* 2.92*  CALCIUM 8.9 8.4* 8.4*   GFR: Estimated Creatinine Clearance: 16 mL/min (by C-G formula based on Cr of 2.92). Liver Function Tests:  Recent Labs Lab 01/15/2016 1643  AST 41  ALT 31  ALKPHOS 92  BILITOT 0.3  PROT 7.1  ALBUMIN 2.5*   No results for input(s): LIPASE, AMYLASE in the last 168 hours. No results for input(s): AMMONIA in the last 168 hours. Coagulation Profile:  Recent Labs Lab 12/27/2015 2100  INR 2.61*   Cardiac Enzymes: No results for input(s): CKTOTAL, CKMB, CKMBINDEX, TROPONINI in the last 168 hours. BNP (last 3 results) No results for input(s): PROBNP in the last 8760 hours. HbA1C: No results for input(s): HGBA1C in the last 72 hours. CBG: No results for input(s): GLUCAP in the last 168 hours. Lipid Profile: No results for input(s): CHOL, HDL, LDLCALC, TRIG, CHOLHDL, LDLDIRECT in the last 72 hours. Thyroid Function Tests: No results for input(s): TSH, T4TOTAL, FREET4, T3FREE, THYROIDAB in the last 72 hours. Anemia Panel: No results for input(s): VITAMINB12, FOLATE, FERRITIN, TIBC, IRON, RETICCTPCT in the last 72  hours. Urine analysis:    Component Value Date/Time   COLORURINE YELLOW 01/13/2016 1753   APPEARANCEUR TURBID* 01/23/2016 1753   LABSPEC 1.013 12/26/2015 1753   PHURINE 6.0 12/27/2015 1753   GLUCOSEU NEGATIVE 01/09/2016 1753   HGBUR LARGE* 01/02/2016 1753   BILIRUBINUR NEGATIVE 01/15/2016 1753   KETONESUR NEGATIVE 01/23/2016 1753   PROTEINUR 100* 12/27/2015 1753   UROBILINOGEN 0.2 03/11/2015 1829   NITRITE NEGATIVE 01/06/2016 1753   LEUKOCYTESUR LARGE* 01/22/2016 1753   Sepsis Labs: '@LABRCNTIP'$ (procalcitonin:4,lacticidven:4)  Recent Results (from the past 240 hour(s))  MRSA PCR Screening     Status: None   Collection Time: 01/16/2016  8:30 AM  Result Value Ref Range Status   MRSA by PCR NEGATIVE NEGATIVE Final    Comment:        The GeneXpert MRSA Assay (FDA approved for NASAL specimens only), is one component of a comprehensive MRSA colonization surveillance program. It is not intended to diagnose MRSA  infection nor to guide or monitor treatment for MRSA infections.   Culture, blood (routine x 2)     Status: None (Preliminary result)   Collection Time: 01/15/2016  4:43 PM  Result Value Ref Range Status   Specimen Description BLOOD LEFT ANTECUBITAL  Final   Special Requests BOTTLES DRAWN AEROBIC AND ANAEROBIC 4CC  Final   Culture  Setup Time   Final    GRAM POSITIVE COCCI IN CHAINS IN BOTH AEROBIC AND ANAEROBIC BOTTLES CRITICAL RESULT CALLED TO, READ BACK BY AND VERIFIED WITH: C WOODARD,RN '@0613'$  01/08/16 MKELLY    Culture GRAM POSITIVE COCCI  Final   Report Status PENDING  Incomplete  Culture, blood (routine x 2)     Status: None (Preliminary result)   Collection Time: 01/20/2016  5:10 PM  Result Value Ref Range Status   Specimen Description BLOOD RIGHT FOREARM  Final   Special Requests BOTTLES DRAWN AEROBIC AND ANAEROBIC 4CC  Final   Culture  Setup Time   Final    GRAM POSITIVE COCCI IN CHAINS IN BOTH AEROBIC AND ANAEROBIC BOTTLES CRITICAL RESULT CALLED TO, READ BACK  BY AND VERIFIED WITH: C WOODARD,RN '@0613'$  01/08/16 MKELLY    Culture GRAM POSITIVE COCCI  Final   Report Status PENDING  Incomplete  Urine culture     Status: Abnormal   Collection Time: 01/19/2016  5:53 PM  Result Value Ref Range Status   Specimen Description URINE, CATHETERIZED  Final   Special Requests NONE  Final   Culture >=100,000 COLONIES/mL YEAST (A)  Final   Report Status 01/09/2016 FINAL  Final      Radiology Studies: Ct Head Wo Contrast 01/16/2016  1. No acute intracranial pathology seen on CT. 2. Moderate cortical volume loss and diffuse small vessel ischemic microangiopathy. 3. Chronic infarct at the right temporal lobe and basal ganglia, with associated encephalomalacia.   Dg Chest Port 1 View 01/10/2016  No acute abnormality. Cardiomegaly and chronic interstitial lung disease.    Scheduled Meds: . ceFEPime (MAXIPIME) IV  1 g Intravenous Q24H  . fluconazole (DIFLUCAN) IV  100 mg Intravenous Q24H  . sodium chloride flush  3 mL Intravenous Q12H  . vancomycin  1,000 mg Intravenous Q48H   Continuous Infusions: . sodium chloride 50 mL/hr at 01/09/16 1952     LOS: 3 days    Time spent: 25 minutes   Leisa Lenz, MD [facility] Pager [phone]  If 7PM-7AM, please contact night-coverage [email or web address] Password [email or web address] 01/10/2016, 9:10 AM

## 2016-01-10 NOTE — Progress Notes (Signed)
Pharmacy Antibiotic Note  Emmaline LifeJames C Corpening is a 82 y.o. male admitted on 01/09/2016 from nursing home with sepsis.  Pharmacy has been consulted for vancomycin and cefepime dosing.  Patient's renal function is worsening.  Plan: - Continue Vanc 1gm IV Q48H - Cefepime 1gm IV Q24H - Monitor renal fxn, micro data, vanc trough soon - Consider de-escalating cefepime to Ancef   Weight: 147 lb 8 oz (66.906 kg)  Temp (24hrs), Avg:98.5 F (36.9 C), Min:97.9 F (36.6 C), Max:99.5 F (37.5 C)   Recent Labs Lab 01/19/2016 1643 01/23/2016 1655 01/12/2016 1940 12/28/2015 2205 01/08/16 0458 01/10/16 0337  WBC 31.2*  --   --   --  24.2* 26.6*  CREATININE 2.78*  --   --   --  2.40* 2.92*  LATICACIDVEN  --  3.93* 2.6* 2.7*  --   --     Estimated Creatinine Clearance: 16 mL/min (by C-G formula based on Cr of 2.92).    Allergies  Allergen Reactions  . Hydrocodone Other (See Comments)    Listed on MAR - unknown reaction  . Penicillins Other (See Comments)    Listed on MAR - unknown reaction  . Tetanus Toxoids Other (See Comments)    Listed on MAR - unknown reaction    Antimicrobials this admission: Vanc 4/14 >> Cefepime 4/14 >> Fluc 4/14 >>  Dose adjustments this admission: N/A  Microbiology results: 4/14 BCx - 2/2 GPC in chains 4/14 Urine cx - yeast 4/14 MRSA - negative    Rama Sorci D. Laney Potashang, PharmD, BCPS Pager:  [phone] 01/10/2016, 9:57 AM

## 2016-01-11 DIAGNOSIS — B954 Other streptococcus as the cause of diseases classified elsewhere: Secondary | ICD-10-CM | POA: Diagnosis present

## 2016-01-11 LAB — BASIC METABOLIC PANEL
BUN: 116 mg/dL — ABNORMAL HIGH (ref 6–20)
CO2: 12 mmol/L — AB (ref 22–32)
Calcium: 8.5 mg/dL — ABNORMAL LOW (ref 8.9–10.3)
Creatinine, Ser: 3.64 mg/dL — ABNORMAL HIGH (ref 0.61–1.24)
GFR calc non Af Amer: 14 mL/min — ABNORMAL LOW (ref >60)
GFR, EST AFRICAN AMERICAN: 16 mL/min — AB (ref >60)
Glucose, Bld: 103 mg/dL — ABNORMAL HIGH (ref 65–99)
POTASSIUM: 5.1 mmol/L (ref 3.5–5.1)
SODIUM: 160 mmol/L — AB (ref 135–145)

## 2016-01-11 LAB — CBC
HCT: 34.1 % — ABNORMAL LOW (ref 39.0–52.0)
HEMOGLOBIN: 11.1 g/dL — AB (ref 13.0–17.0)
MCH: 26.7 pg (ref 26.0–34.0)
MCHC: 32.6 g/dL (ref 30.0–36.0)
MCV: 82 fL (ref 78.0–100.0)
PLATELETS: 165 10*3/uL (ref 150–400)
RBC: 4.16 MIL/uL — AB (ref 4.22–5.81)
RDW: 15.1 % (ref 11.5–15.5)
WBC: 32.7 10*3/uL — AB (ref 4.0–10.5)

## 2016-01-11 MED ORDER — GLYCOPYRROLATE 0.2 MG/ML IJ SOLN
0.2000 mg | INTRAMUSCULAR | Status: DC
  Administered 2016-01-11 (×3): 0.2 mg via INTRAVENOUS
  Filled 2016-01-11 (×7): qty 1

## 2016-01-11 MED ORDER — MORPHINE SULFATE (PF) 2 MG/ML IV SOLN
2.0000 mg | INTRAVENOUS | Status: DC | PRN
  Administered 2016-01-11 (×2): 2 mg via INTRAVENOUS
  Filled 2016-01-11 (×4): qty 1

## 2016-01-11 MED ORDER — MORPHINE SULFATE (PF) 2 MG/ML IV SOLN
2.0000 mg | INTRAVENOUS | Status: DC
  Administered 2016-01-11 (×2): 2 mg via INTRAVENOUS

## 2016-01-11 MED ORDER — ACETAMINOPHEN 650 MG RE SUPP
650.0000 mg | RECTAL | Status: AC
  Administered 2016-01-11: 650 mg via RECTAL
  Filled 2016-01-11: qty 1

## 2016-01-11 MED ORDER — MORPHINE SULFATE (PF) 2 MG/ML IV SOLN: 2.0000 mg | INTRAVENOUS | Status: DC | PRN

## 2016-01-24 NOTE — Care Management Important Message (Signed)
Important Message  Patient Details  Name: Jerry [name] MRN: [id number] Date of Birth: [birth year]   Medicare Important Message Given:  Yes    Kyla BalzarineShealy, Saphyre Cillo [place]ena Aug 18, 2016, 10:56 AM

## 2016-01-24 NOTE — Progress Notes (Signed)
CRITICAL VALUE ALERT  Critical value received:  Chloride >130  Date of notification:  01/17/2016   Time of notification:  0420  Critical value read back:   Nurse who received alert:  Janice NorrieMisty Ennis  MD notified (1st page):  Tama GanderKatherine Schorr  Time of first page:  4:23 AM   MD notified (2nd page):  Time of second page:  Responding MD:    Time MD responded:

## 2016-01-24 NOTE — Discharge Summary (Signed)
Death Summary  Jerry [name]:[id number] DOB: [birth year] DOA: 01/12/16  PCP: Kirt Boys, DO  Admit date: 01-12-2016 Date of Death: Jan 19, 2016  Final Diagnoses:  Principal Problem:   Sepsis secondary to UTI [facility]) Active Problems:   Acute encephalopathy   Leukocytosis   Bacterial infection due to Streptococcus, group G   Essential hypertension   Chronic atrial fibrillation (HCC)   Protein-calorie malnutrition, severe (HCC)   Anemia of chronic disease   Acute renal failure superimposed on stage 3 chronic kidney disease (HCC)   CVA (cerebrovascular accident) [facility])   Palliative care encounter    History of present illness:  82 y.o. male with past medical history of CVA (multiple prior infarcts, hospitalized in June 2016 for acute CVA), HTN, chronic atrial fibrillation (anticoagulated with Eliquis) who presented from [facility] SNF for evaluation of worsening mental status changes since few days prior to this admission. No respiratory distress. No vomiting. He is in a wheelchair most days due to fall risk. In SNF, he spiked a fever and fell from wheelchair.  On admission, T max was 100.7 F, BP 914/45, HR 130, RR 31, oxygen saturation was 95% on room air. Blood work showed WBC count of 31.2, hemoglobin was 10.8, platelets 150 -->140, creatinine 2.78, INR 2.61, lactic acid 3.93, procalcitonin 4.21. CXR showed no acute cardiopulmonary process. CT head showed no acute intracranial findings. UA showed large leukocytes and many bacteria and yeast. He was started on vanco, cefepime for sepsis due to UTI.  Subsequently his urine culture grew yeast and blood cultures grew strep G species. He is now on rocephin ddn fluconazole. Palliative care has seen him in consultation. Family okay with comfort care with the exception of antibiotics.   Hospital Course:   Assessment & Plan:  Principal Problem:  Sepsis secondary to yeast UTI (HCC) / Streptococcus G bacteremia / Leukocytosis - Sepsis  criteria met on admission with fever, hypotension, tachycardia, tachypnea, lactic acidosis and elevated procalcitonin level - Presumed source of sepsis is UTI and bacteremia - Urine culture growing yeast - Streptococcus G bacteremia on blood cultures - He was on vancomycin, cefepime from the time of admission through 01/10/2016. Then started rocephin 4/17. - Fluconazole was added once we got urine culture report back 4/15. - Family prefers comfort care   Active Problems:  Acute septic encephalopathy - Likely due to history of stroke  - Patient unresponsive to verbal or painful stimuli  - Comfort care    Essential hypertension - Antihypertensives on hold due to soft BP   Chronic atrial fibrillation (HCC) - CHADS vasc score 4 - AC on hold due to coaglopathy and thrombocytopenia - Not on any BB due to soft blood pressure    Protein-calorie malnutrition, severe (HCC) - In the context of chronic illness - Nothing by mouth secondary to poor mental status    Anemia of chronic disease - Due to CKD - Hemoglobin 11.1   Acute renal failure superimposed on stage 3 chronic kidney disease (HCC) - Baseline Cr 10 months ago 1.35 - Creatinine initially improving but now trending up which is likely due to worsening sepsis, end of life approach   CVA (cerebrovascular accident) (HCC) - AC on hold due to risk of bleed, thrombocytopenia    DVT prophylaxis: SCD's bilaterally  Code Status: DNR/DNI     Consultants:   Palliative care  Procedures:   None  Antimicrobials:   Vanco and cefepime 01/08/2016 --> 01/10/2016  Rocephin 01/10/2016 --> 4/18  Fluconazole 01/08/2016 -->  4/18   Time: 2200 on 01/17/2016  Signed:  Leisa Lenz  [facility] 01/14/2016, 11:50 AM

## 2016-01-24 NOTE — Progress Notes (Signed)
Nutrition Brief Note  Patient identified on the </= 12 Braden score report. Pt goal of care is comfort measures. No nutrition interventions warranted at this time.  Please consult as needed.   Maureen ChattersKatie Min Tunnell, RD, LDN Pager #: [phone] After-Hours Pager #: [phone]

## 2016-01-24 NOTE — Progress Notes (Signed)
Pt monitor rang out asystole at 2000. Pt was known to be full DNR and on comfort care.  At this time this RN and fellow RN April Whitlow went to the bedside to find the patient nonresponsive and asystolic on the monitor.  No heart sounds were heard over the apex, nor were breath sounds audible on auscultation, and no carotid pulse was palpable.  Time of death was pronounced at 2005.  MD was notified and post-mortem checklist was completed.

## 2016-01-24 NOTE — Progress Notes (Signed)
Daily Progress Note   Patient Name: Jerry [name]       Date: 01/12/2016 DOB: 12/29/1923  Age: 82 y.o. MRN#: [id number] Attending Physician: Alison Murray, MD Primary Care Physician: Kirt Boys, DO Admit Date: Jan 31, 2016  Reason for Consultation/Follow-up: Establishing goals of care, Non pain symptom management, Pain control and Terminal Care  Subjective: Jerry [name] is progressing and actively dying. He appears to have more irregular breathing, eyes more closed, muscles more relaxed. Extremities cold and cyanotic. Explained to multiple family members that he could pass at anytime. All agree with comfort care and understand he is dying. Offered emotional support and therapeutic listening. They share very fond memories of Jerry [name] and how he has lived life to the fullest.    Length of Stay: 4 days  Current Medications: Scheduled Meds:  . acetaminophen  650 mg Rectal NOW  . cefTRIAXone (ROCEPHIN)  IV  2 g Intravenous Q12H  . fluconazole (DIFLUCAN) IV  100 mg Intravenous Q24H  . glycopyrrolate  0.2 mg Intravenous 6 times per day  .  morphine injection  2 mg Intravenous 6 times per day  . sodium chloride flush  3 mL Intravenous Q12H    Continuous Infusions: . sodium chloride 10 mL/hr at 01/10/16 1134    PRN Meds: acetaminophen **OR** acetaminophen, antiseptic oral rinse, haloperidol lactate, ipratropium-albuterol, morphine injection, ondansetron **OR** ondansetron (ZOFRAN) IV, polyvinyl alcohol  Physical Exam: Physical Exam  Constitutional: He appears well-developed.  HENT:  Head: Normocephalic and atraumatic.  Cardiovascular: Tachycardia present.   Pulmonary/Chest: No accessory muscle usage. Tachypnea noted. He is in respiratory distress. He has rhonchi. He has rales.    Abdominal: Soft. Normal appearance.  Neurological: He is unresponsive.                Vital Signs: BP 91/41 mmHg  Pulse 149  Temp(Src) 100.9 F (38.3 C) (Axillary)  Resp 37  Wt 66.906 kg (147 lb 8 oz)  SpO2 99% SpO2: SpO2: 99 % O2 Device: O2 Device: NRB O2 Flow Rate: O2 Flow Rate (L/min): 15 L/min  Intake/output summary:  Intake/Output Summary (Last 24 hours) at 01/10/2016 0908 Last data filed at 01/07/2016 0500  Gross per 24 hour  Intake    200 ml  Output    575 ml  Net   -375 ml  LBM: Last BM Date: 01/08/16 Baseline Weight: Weight: 66.906 kg (147 lb 8 oz) Most recent weight: Weight: 66.906 kg (147 lb 8 oz)       Palliative Assessment/Data:   Additional Data Reviewed: CBC    Component Value Date/Time   WBC 32.7* 2016-01-19 0232   RBC 4.16* 01-19-16 0232   HGB 11.1* January 19, 2016 0232   HCT 34.1* 01/19/16 0232   PLT 165 19-Jan-2016 0232   MCV 82.0 01/19/2016 0232   MCH 26.7 January 19, 2016 0232   MCHC 32.6 January 19, 2016 0232   RDW 15.1 01-19-16 0232   LYMPHSABS 0.6* 01/09/2016 1643   MONOABS 0.9 01/10/2016 1643   EOSABS 0.0 01/17/2016 1643   BASOSABS 0.0 12/31/2015 1643    CMP     Component Value Date/Time   NA 160* 2016-01-19 0232   NA 139 11/03/2015   K 5.1 01/19/16 0232   CL >130* 01-19-16 0232   CO2 12* 01/19/2016 0232   GLUCOSE 103* 01/19/16 0232   BUN 116* 19-Jan-2016 0232   BUN 41* 11/03/2015   CREATININE 3.64* 01/19/16 0232   CREATININE 2.5* 11/03/2015   CALCIUM 8.5* 01-19-2016 0232   PROT 7.1 01/02/2016 1643   ALBUMIN 2.5* 01/02/2016 1643   AST 41 12/26/2015 1643   ALT 31 01/13/2016 1643   ALKPHOS 92 12/25/2015 1643   BILITOT 0.3 01/06/2016 1643   GFRNONAA 14* 2016-01-19 0232   GFRAA 16* 2016/01/19 0232       Problem List:  Patient Active Problem List   Diagnosis Date Noted  . Palliative care encounter   . Acute renal failure superimposed on stage 3 chronic kidney disease (HCC) 01/08/2016  . CVA (cerebrovascular accident)  (HCC) 01/08/2016  . Sepsis secondary to UTI (HCC) 01/08/2016  . Gram-positive bacteremia 01/08/2016  . Leukocytosis 01/08/2016  . Acute encephalopathy 01/04/2016  . Anemia of chronic disease 07/27/2015  . Protein-calorie malnutrition, severe (HCC) 03/18/2015  . Chronic atrial fibrillation (HCC) 03/11/2015  . Essential hypertension 02/08/2015  . History of subdural hematoma 02/08/2015  . History of subarachnoid hemorrhage 02/08/2015     Palliative Care Assessment & Plan    1.Code Status:  DNR    Code Status Orders        Start     Ordered   01/10/16 1512  Do not attempt resuscitation (DNR)   Continuous    Question Answer Comment  In the event of cardiac or respiratory ARREST Do not call a "code blue"   In the event of cardiac or respiratory ARREST Do not perform Intubation, CPR, defibrillation or ACLS   In the event of cardiac or respiratory ARREST Use medication by any route, position, wound care, and other measures to relive pain and suffering. May use oxygen, suction and manual treatment of airway obstruction as needed for comfort.      01/10/16 1511    Code Status History    Date Active Date Inactive Code Status Order ID Comments User Context   01/03/2016  8:26 PM 01/10/2016  3:11 PM DNR [id number]  Michael Litter, MD Inpatient   01/17/2016  5:16 PM 01/05/2016  8:26 PM DNR [id number]  Vanetta Mulders, MD ED   03/11/2015 10:30 PM 03/19/2015  6:26 PM DNR [id number]  Eduard Clos, MD Inpatient   03/11/2015  9:11 PM 03/11/2015 10:30 PM DNR [id number]  Doug Sou, MD ED   12/11/2014  7:43 PM 12/14/2014  5:11 PM Full Code [id number]  Lorretta Harp, MD Inpatient   09/15/2013  2:56 PM 09/17/2013  2:36  PM Full Code [id number]  Naiping Glee ArvinMichael Xu, MD Inpatient    Advance Directive Documentation        Most Recent Value   Type of Advance Directive  Healthcare Power of Attorney, Living will, Out of facility DNR (pink MOST or yellow form)   Pre-existing out of facility DNR order (yellow  form or pink MOST form)     "MOST" Form in Place?         2. Goals of Care/Additional Recommendations:  Comfort care. Actively dying.   Limitations on Scope of Treatment: Full Comfort Care  Desire for further Chaplaincy support:yes  Psycho-social Needs: Caregiving  Support/Resources and Education on Hospice  3. Symptom Management:  Pain/dyspnea: Morphine 2 mg every 4 hours. Morphine 2 mg every 30 min prn.   Secretions: Robinul 0.2 mg IV once and then every 4 hours.   Agitation: Haldol 1-2 mg every 6 hours prn.  4. Palliative Prophylaxis:   Bowel Regimen, Delirium Protocol, Frequent Pain Assessment, Oral Care and Turn Reposition  5. Prognosis: Hours - Days  6. Discharge Planning:  Anticipated Hospital Death   Thank you for allowing the Palliative Medicine Team to assist in the care of this patient.   Time In: 0840 Time Out: 0910 Total Time 30min Prolonged Time Billed  no         Ulice BoldAlicia C Anasia Agro, NP  2015/12/10, 9:08 AM  Please contact Palliative Medicine Team phone at [phone] for questions and concerns.

## 2016-01-24 NOTE — Progress Notes (Signed)
Patient ID: Jerry [name], male   DOB: 02-May-1924, 82 y.o.   MRN: [id number]  PROGRESS NOTE    Jerry [name]  WUJ:[id number] DOB: 08-Sep-1924 DOA: 01/12/2016  PCP: [name], DO  Outpatient Specialists:   Brief Narrative:  82 y.o. male with past medical history of CVA (multiple prior infarcts, hospitalized in June 2016 for acute CVA), HTN, chronic atrial fibrillation (anticoagulated with Eliquis) who presented from [facility] for evaluation of worsening mental status changes since few days prior to this admission. No respiratory distress. No vomiting. He is in a wheelchair most days due to fall risk. In SNF, he spiked a fever and fell from wheelchair.  On admission, T max was 100.7 F, BP 914/45, HR 130, RR 31, oxygen saturation was 95% on room air. Blood work showed WBC count of 31.2, hemoglobin was 10.8, platelets 150 -->140, creatinine 2.78, INR 2.61, lactic acid 3.93, procalcitonin 4.21. CXR showed no acute cardiopulmonary process. CT head showed no acute intracranial findings. UA showed large leukocytes and many bacteria and yeast. He was started on vanco, cefepime for sepsis due to UTI.  Subsequently his urine culture grew yeast and blood cultures grew strep G species. He is now on rocephin ddn fluconazole. Palliative care has seen him in consultation. Family okay with comfort care with the exception of antibiotics.   Assessment & Plan:   Principal Problem:   Sepsis secondary to yeast UTI ([place]) / Streptococcus G bacteremia / Leukocytosis - Sepsis criteria met on admission with fever, hypotension, tachycardia, tachypnea, lactic acidosis and elevated procalcitonin level - Presumed source of sepsis is UTI and bacteremia - Urine culture growing yeast - Streptococcus G bacteremia on blood cultures - He was on vancomycin, cefepime from the time of admission through 01/10/2016. Then started rocephin 4/17. - Fluconazole was added once we got urine culture report back 4/15. - Appreciate  palliative care for goals of care, comfort care  Active Problems:   Acute encephalopathy - Likely due to history of stroke  - Patient unresponsive to verbal or painful stimuli this morning    Essential hypertension - Antihypertensives on hold due to soft BP    Chronic atrial fibrillation (HCC) - CHADS vasc score 4 - AC on hold due to coaglopathy and thrombocytopenia - Not on any BB due to soft blood pressure     Protein-calorie malnutrition, severe (HCC) - In the context of chronic illness - Nothing by mouth secondary to poor mental status     Anemia of chronic disease - Due to CKD - Hemoglobin 11.1    Acute renal failure superimposed on stage 3 chronic kidney disease (HCC) - Baseline Cr 10 months ago 1.35 - Creatinine initially improving but now trending up which is likely due to worsening sepsis, end of life approach    CVA (cerebrovascular accident) ([place]) - AC on hold due to risk of bleed, thrombocytopenia  - Pt on comfort care, no further need to check INR   DVT prophylaxis: SCD's bilaterally  Code Status: DNR/DNI  Family Communication: no family at the bedside; I tried calling patient's home, patient's son and daughter over the phone to give an update but no response and there was no option on cell phone to leave VM on 4/17; today i spoke with pt daughter in law over the phone Disposition Plan: poor prognosis, comfort care    Consultants:   Palliative care   Procedures:   None   Antimicrobials:   Vanco and cefepime 01/08/2016 --> 01/10/2016  Rocephin 01/10/2016 -->  Fluconazole 01/08/2016 -->   Subjective: No overnight events. On Ventimask.  Objective: Filed Vitals:   Jan 19, 2016 0220 19-Jan-2016 0400 01-19-2016 0900 Jan 19, 2016 1223  BP: 95/40 91/41 103/50 83/50  Pulse:   143 165  Temp:  100.9 F (38.3 C) 97.8 F (36.6 C) 97.8 F (36.6 C)  TempSrc:  Axillary Axillary Axillary  Resp: 30 37 30 26  Weight:      SpO2: 97% 99% 96% 94%    Intake/Output  Summary (Last 24 hours) at 01/19/2016 1422 Last data filed at 2016/01/19 0500  Gross per 24 hour  Intake    190 ml  Output    575 ml  Net   -385 ml   Filed Weights   01/08/16 0534  Weight: 66.906 kg (147 lb 8 oz)    Examination:  General exam: patient not responding to verbal or painful stimuli Respiratory system: coarse breath sounds, gurgling sounds (+) Cardiovascular system: S1, S2 heard , tachycardic Gastrointestinal system: (+) BS, non tender  Central nervous system: No focal neurologic deficits  Extremities: No cyanosis, no tenderness to palpation  Skin: skin warm, dry Psychiatry: unable to test since pt not responding to verbal or painful stimuli   Data Reviewed: I have personally reviewed following labs and imaging studies  CBC:  Recent Labs Lab 01/16/2016 1643 01/08/16 0458 01/10/16 0337 2016/01/19 0232  WBC 31.2* 24.2* 26.6* 32.7*  NEUTROABS 29.7*  --   --   --   HGB 10.8* 9.9* 10.5* 11.1*  HCT 32.7* 29.7* 31.9* 34.1*  MCV 81.8 80.7 81.2 82.0  PLT 151 140* 140* 194   Basic Metabolic Panel:  Recent Labs Lab 01/02/2016 1643 01/08/16 0458 01/10/16 0337 01/19/2016 0232  NA 139 143 153* 160*  K 4.5 4.1 4.2 5.1  CL 108 116* 126* >130*  CO2 15* 16* 13* 12*  GLUCOSE 153* 119* 107* 103*  BUN 73* 67* 93* 116*  CREATININE 2.78* 2.40* 2.92* 3.64*  CALCIUM 8.9 8.4* 8.4* 8.5*   GFR: Estimated Creatinine Clearance: 12.9 mL/min (by C-G formula based on Cr of 3.64). Liver Function Tests:  Recent Labs Lab 01/06/2016 1643  AST 41  ALT 31  ALKPHOS 92  BILITOT 0.3  PROT 7.1  ALBUMIN 2.5*   No results for input(s): LIPASE, AMYLASE in the last 168 hours. No results for input(s): AMMONIA in the last 168 hours. Coagulation Profile:  Recent Labs Lab 01/10/2016 2100  INR 2.61*   Cardiac Enzymes: No results for input(s): CKTOTAL, CKMB, CKMBINDEX, TROPONINI in the last 168 hours. BNP (last 3 results) No results for input(s): PROBNP in the last 8760 hours. HbA1C: No  results for input(s): HGBA1C in the last 72 hours. CBG: No results for input(s): GLUCAP in the last 168 hours. Lipid Profile: No results for input(s): CHOL, HDL, LDLCALC, TRIG, CHOLHDL, LDLDIRECT in the last 72 hours. Thyroid Function Tests: No results for input(s): TSH, T4TOTAL, FREET4, T3FREE, THYROIDAB in the last 72 hours. Anemia Panel: No results for input(s): VITAMINB12, FOLATE, FERRITIN, TIBC, IRON, RETICCTPCT in the last 72 hours. Urine analysis:    Component Value Date/Time   COLORURINE YELLOW 01/06/2016 1753   APPEARANCEUR TURBID* 01/09/2016 1753   LABSPEC 1.013 01/03/2016 1753   PHURINE 6.0 12/31/2015 1753   GLUCOSEU NEGATIVE 01/06/2016 1753   HGBUR LARGE* 01/12/2016 1753   BILIRUBINUR NEGATIVE 01/06/2016 1753   [place] 01/06/2016 1753   PROTEINUR 100* 01/04/2016 1753   UROBILINOGEN 0.2 03/11/2015 1829   NITRITE NEGATIVE 01/22/2016 1753  LEUKOCYTESUR LARGE* 01/06/2016 1753   Sepsis Labs: '@LABRCNTIP'$ (procalcitonin:4,lacticidven:4)  Recent Results (from the past 240 hour(s))  MRSA PCR Screening     Status: None   Collection Time: 01/03/2016  8:30 AM  Result Value Ref Range Status   MRSA by PCR NEGATIVE NEGATIVE Final    Comment:        The GeneXpert MRSA Assay (FDA approved for NASAL specimens only), is one component of a comprehensive MRSA colonization surveillance program. It is not intended to diagnose MRSA infection nor to guide or monitor treatment for MRSA infections.   Culture, blood (routine x 2)     Status: Abnormal   Collection Time: 01/05/2016  4:43 PM  Result Value Ref Range Status   Specimen Description BLOOD LEFT ANTECUBITAL  Final   Special Requests BOTTLES DRAWN AEROBIC AND ANAEROBIC 4CC  Final   Culture  Setup Time   Final    GRAM POSITIVE COCCI IN CHAINS IN BOTH AEROBIC AND ANAEROBIC BOTTLES CRITICAL RESULT CALLED TO, READ BACK BY AND VERIFIED WITH: C WOODARD,RN '@0613'$  01/08/16 MKELLY    Culture STREPTOCOCCUS GROUP G (A)  Final    Report Status 01/10/2016 FINAL  Final   Organism ID, Bacteria STREPTOCOCCUS GROUP G  Final      Susceptibility   Streptococcus group g - MIC*    CLINDAMYCIN <=0.25 SENSITIVE Sensitive     AMPICILLIN <=0.25 SENSITIVE Sensitive     ERYTHROMYCIN <=0.12 SENSITIVE Sensitive     VANCOMYCIN 0.5 SENSITIVE Sensitive     CEFTRIAXONE <=0.12 SENSITIVE Sensitive     LEVOFLOXACIN 0.5 SENSITIVE Sensitive     * STREPTOCOCCUS GROUP G  Culture, blood (routine x 2)     Status: Abnormal   Collection Time: 01/06/2016  5:10 PM  Result Value Ref Range Status   Specimen Description BLOOD RIGHT FOREARM  Final   Special Requests BOTTLES DRAWN AEROBIC AND ANAEROBIC 4CC  Final   Culture  Setup Time   Final    GRAM POSITIVE COCCI IN CHAINS IN BOTH AEROBIC AND ANAEROBIC BOTTLES CRITICAL RESULT CALLED TO, READ BACK BY AND VERIFIED WITH: C WOODARD,RN '@0613'$  01/08/16 MKELLY    Culture (A)  Final    STREPTOCOCCUS GROUP G SUSCEPTIBILITIES PERFORMED ON PREVIOUS CULTURE WITHIN THE LAST 5 DAYS.    Report Status 01/10/2016 FINAL  Final  Urine culture     Status: Abnormal   Collection Time: 01/16/2016  5:53 PM  Result Value Ref Range Status   Specimen Description URINE, CATHETERIZED  Final   Special Requests NONE  Final   Culture >=100,000 COLONIES/mL YEAST (A)  Final   Report Status 01/09/2016 FINAL  Final      Radiology Studies: Ct Head Wo Contrast 01/18/2016  1. No acute intracranial pathology seen on CT. 2. Moderate cortical volume loss and diffuse small vessel ischemic microangiopathy. 3. Chronic infarct at the right temporal lobe and basal ganglia, with associated encephalomalacia.   Dg Chest Port 1 View 01/05/2016  No acute abnormality. Cardiomegaly and chronic interstitial lung disease.    Scheduled Meds: . cefTRIAXone (ROCEPHIN)  IV  2 g Intravenous Q12H  . fluconazole (DIFLUCAN) IV  100 mg Intravenous Q24H  . glycopyrrolate  0.2 mg Intravenous 6 times per day  .  morphine injection  2 mg Intravenous 6  times per day  . sodium chloride flush  3 mL Intravenous Q12H   Continuous Infusions: . sodium chloride 10 mL/hr at 01/10/16 1134     LOS: 4 days    Time spent:  25 minutes   Leisa Lenz, MD [facility] Pager [phone]  If 7PM-7AM, please contact night-coverage [email or web address] Password [email or web address] 02-08-2016, 2:22 PM

## 2016-01-24 DEATH — deceased

## 2016-08-25 IMAGING — CT CT HEAD W/O CM
1 of 4 series · 5 of 37 positions shown, 7 images · non-contrast
Comparison: 02/14/2015

CLINICAL DATA: Multiple falls in last few weeks, initial encounter

EXAM:
CT HEAD WITHOUT CONTRAST
CT CERVICAL SPINE WITHOUT CONTRAST
TECHNIQUE: Multidetector CT imaging of the head and cervical spine was
performed following the standard protocol without intravenous
contrast. Multiplanar CT image reconstructions of the cervical spine
were also generated.

[Series 306: sag · sagittal · 0.39mm/px · 5 of 59 slices shown, 7 images]
[im 10/59  brain]
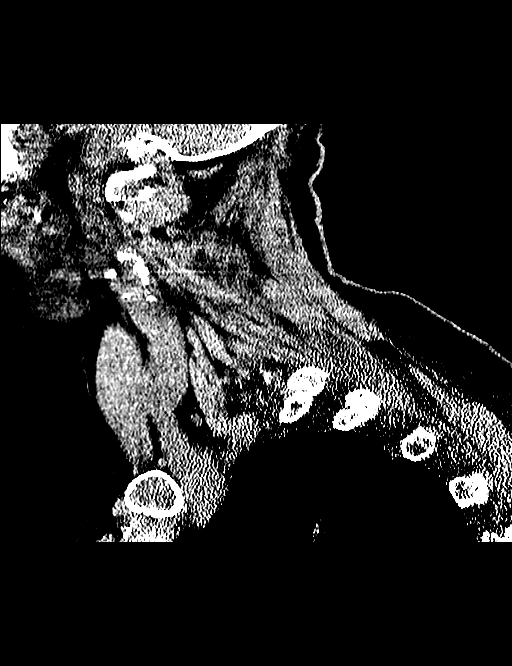
[im 10/59  bone]
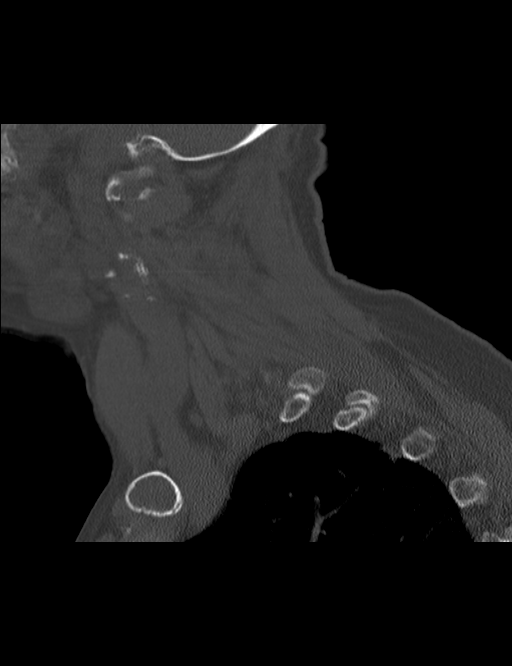
[im 20/59  brain]
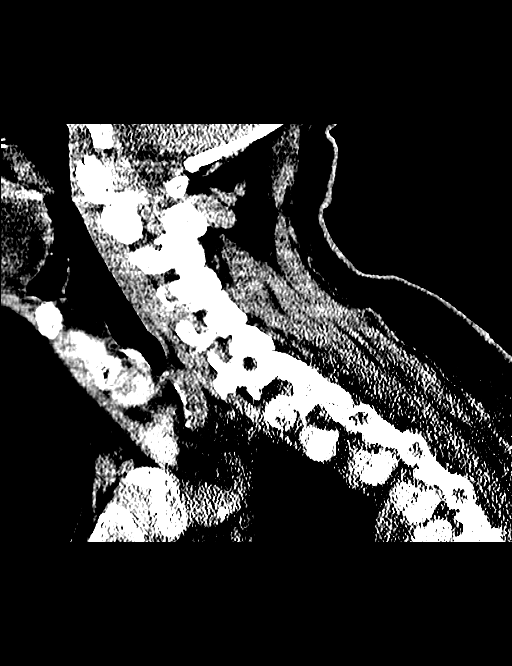
[im 30/59  brain]
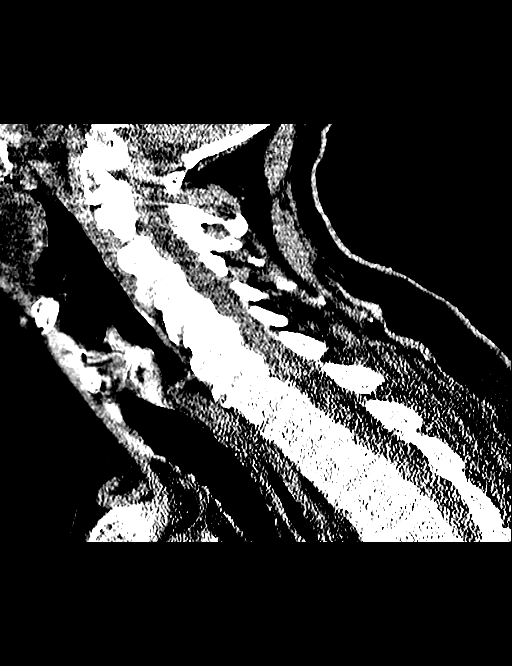
[im 39/59  brain]
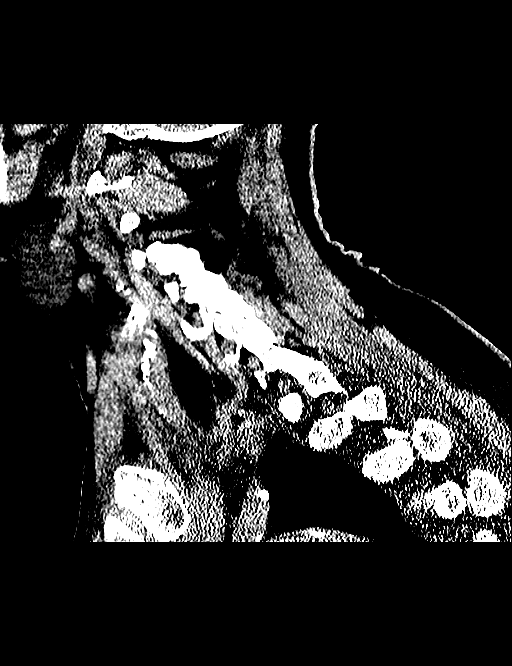
[im 49/59  brain]
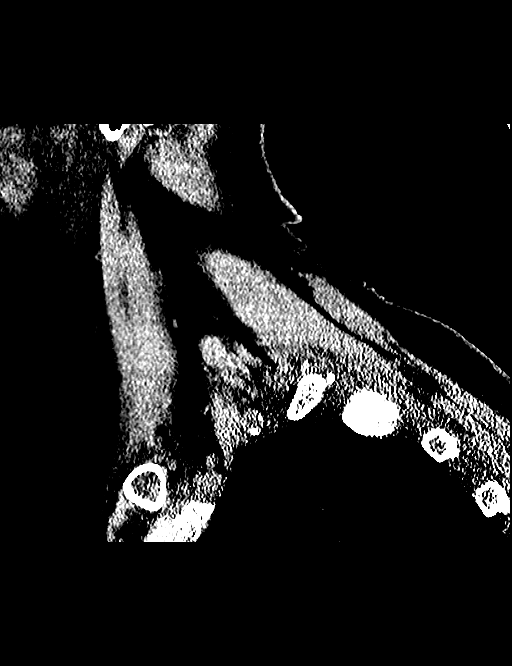
[im 49/59  bone]
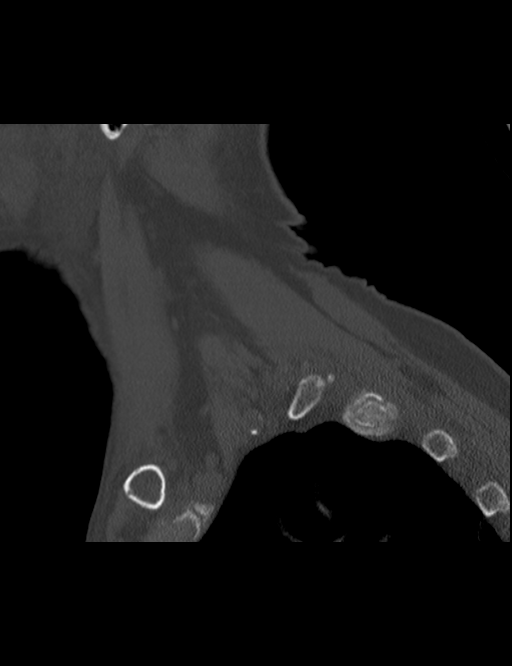

[5 of 37 positions shown; findings below may reference images not displayed]

FINDINGS: CT HEAD FINDINGS

The bony calvarium is intact. Diffuse atrophic and chronic white
matter ischemic changes are seen. There are changes of prior
infarcts identified within the right temporal lobe and extending
superiorly into the right basal ganglia and centrum semi ovale. No
findings to suggest acute hemorrhage, acute infarction or
space-occupying mass lesion are noted.

CT CERVICAL SPINE FINDINGS

The examination is somewhat limited by patient motion artifact.
Multilevel facet hypertrophic changes are seen. Vertebral body
height is well maintained. Osteophytic changes are noted worst at
the C5-6 and C6-7 level. Changes consistent with healing fracture of
the left second rib posteriorly are seen. The visualized lung apices
are within normal limits. The surrounding soft tissues show carotid
calcifications without acute abnormality. Seen only on the sagittal
reconstructions there is an undisplaced fracture of the left
clavicular head.
IMPRESSION: CT of the head: Chronic atrophic and ischemic changes as well as
findings of prior infarcts. These changes are stable from the prior
study.

CT of the cervical spine:  Healing left second rib fracture.

Left clavicular head fracture without significant displacement. No
other focal acute bony abnormality is noted.

Multilevel degenerative change .

Multilevel degenerative change without acute abnormality.
